# Patient Record
Sex: Female | Born: 1960 | Race: White | Hispanic: No | Marital: Married | Smoking: Current every day smoker
Health system: Southern US, Community
[De-identification: ages and names within clinical notes are randomized; demographics above are authoritative.]

## PROBLEM LIST (undated history)

## (undated) DIAGNOSIS — I639 Cerebral infarction, unspecified: Secondary | ICD-10-CM

## (undated) DIAGNOSIS — M549 Dorsalgia, unspecified: Secondary | ICD-10-CM

## (undated) DIAGNOSIS — J45909 Unspecified asthma, uncomplicated: Secondary | ICD-10-CM

## (undated) DIAGNOSIS — G5600 Carpal tunnel syndrome, unspecified upper limb: Secondary | ICD-10-CM

## (undated) DIAGNOSIS — N200 Calculus of kidney: Secondary | ICD-10-CM

## (undated) DIAGNOSIS — I10 Essential (primary) hypertension: Secondary | ICD-10-CM

## (undated) DIAGNOSIS — J449 Chronic obstructive pulmonary disease, unspecified: Secondary | ICD-10-CM

## (undated) HISTORY — DX: Essential (primary) hypertension: I10

## (undated) HISTORY — DX: Carpal tunnel syndrome, unspecified upper limb: G56.00

## (undated) HISTORY — PX: SHOULDER SURGERY: SHX246

## (undated) HISTORY — DX: Cerebral infarction, unspecified: I63.9

## (undated) HISTORY — DX: Chronic obstructive pulmonary disease, unspecified: J44.9

## (undated) HISTORY — DX: Calculus of kidney: N20.0

## (undated) HISTORY — DX: Unspecified asthma, uncomplicated: J45.909

## (undated) HISTORY — DX: Dorsalgia, unspecified: M54.9

## (undated) HISTORY — PX: OTHER SURGICAL HISTORY: SHX169

## (undated) HISTORY — PX: CHOLECYSTECTOMY: SHX55

---

## 1998-06-22 ENCOUNTER — Ambulatory Visit (HOSPITAL_COMMUNITY): Admission: RE | Admit: 1998-06-22 | Discharge: 1998-06-22 | Payer: Self-pay | Admitting: Gynecology

## 2001-10-06 ENCOUNTER — Other Ambulatory Visit: Admission: RE | Admit: 2001-10-06 | Discharge: 2001-10-06 | Payer: Self-pay | Admitting: Obstetrics and Gynecology

## 2002-09-21 ENCOUNTER — Encounter: Payer: Self-pay | Admitting: Obstetrics and Gynecology

## 2002-09-21 ENCOUNTER — Ambulatory Visit (HOSPITAL_COMMUNITY): Admission: RE | Admit: 2002-09-21 | Discharge: 2002-09-21 | Payer: Self-pay | Admitting: Obstetrics and Gynecology

## 2003-08-31 ENCOUNTER — Other Ambulatory Visit: Admission: RE | Admit: 2003-08-31 | Discharge: 2003-08-31 | Payer: Self-pay | Admitting: Obstetrics and Gynecology

## 2004-11-06 ENCOUNTER — Other Ambulatory Visit: Admission: RE | Admit: 2004-11-06 | Discharge: 2004-11-06 | Payer: Self-pay | Admitting: Obstetrics and Gynecology

## 2006-01-23 ENCOUNTER — Other Ambulatory Visit: Admission: RE | Admit: 2006-01-23 | Discharge: 2006-01-23 | Payer: Self-pay | Admitting: Obstetrics and Gynecology

## 2007-02-12 ENCOUNTER — Ambulatory Visit (HOSPITAL_COMMUNITY): Admission: RE | Admit: 2007-02-12 | Discharge: 2007-02-12 | Payer: Self-pay | Admitting: Obstetrics and Gynecology

## 2007-03-19 ENCOUNTER — Other Ambulatory Visit: Admission: RE | Admit: 2007-03-19 | Discharge: 2007-03-19 | Payer: Self-pay | Admitting: Obstetrics and Gynecology

## 2007-06-10 ENCOUNTER — Ambulatory Visit (HOSPITAL_BASED_OUTPATIENT_CLINIC_OR_DEPARTMENT_OTHER): Admission: RE | Admit: 2007-06-10 | Discharge: 2007-06-10 | Payer: Self-pay | Admitting: Obstetrics and Gynecology

## 2008-01-28 ENCOUNTER — Ambulatory Visit: Payer: Self-pay | Admitting: Orthopedic Surgery

## 2008-01-28 DIAGNOSIS — M5430 Sciatica, unspecified side: Secondary | ICD-10-CM | POA: Insufficient documentation

## 2008-02-24 ENCOUNTER — Telehealth: Payer: Self-pay | Admitting: Orthopedic Surgery

## 2008-03-10 ENCOUNTER — Ambulatory Visit: Payer: Self-pay | Admitting: Orthopedic Surgery

## 2008-03-10 DIAGNOSIS — M5126 Other intervertebral disc displacement, lumbar region: Secondary | ICD-10-CM | POA: Insufficient documentation

## 2008-03-11 ENCOUNTER — Telehealth: Payer: Self-pay | Admitting: Orthopedic Surgery

## 2008-03-16 ENCOUNTER — Ambulatory Visit (HOSPITAL_COMMUNITY): Admission: RE | Admit: 2008-03-16 | Discharge: 2008-03-16 | Payer: Self-pay | Admitting: Orthopedic Surgery

## 2008-03-31 ENCOUNTER — Ambulatory Visit: Payer: Self-pay | Admitting: Orthopedic Surgery

## 2008-06-04 ENCOUNTER — Encounter: Admission: RE | Admit: 2008-06-04 | Discharge: 2008-06-04 | Payer: Self-pay | Admitting: Neurology

## 2008-06-09 ENCOUNTER — Encounter: Admission: RE | Admit: 2008-06-09 | Discharge: 2008-06-09 | Payer: Self-pay | Admitting: Neurology

## 2008-07-02 ENCOUNTER — Other Ambulatory Visit: Admission: RE | Admit: 2008-07-02 | Discharge: 2008-07-02 | Payer: Self-pay | Admitting: Obstetrics and Gynecology

## 2008-07-05 ENCOUNTER — Encounter: Payer: Self-pay | Admitting: Orthopedic Surgery

## 2008-07-06 ENCOUNTER — Telehealth: Payer: Self-pay | Admitting: Orthopedic Surgery

## 2009-08-10 ENCOUNTER — Encounter: Admission: RE | Admit: 2009-08-10 | Discharge: 2009-08-10 | Payer: Self-pay | Admitting: Neurosurgery

## 2009-10-12 ENCOUNTER — Ambulatory Visit (HOSPITAL_COMMUNITY): Admission: RE | Admit: 2009-10-12 | Discharge: 2009-10-12 | Payer: Self-pay | Admitting: Obstetrics and Gynecology

## 2010-06-27 ENCOUNTER — Encounter: Admission: RE | Admit: 2010-06-27 | Discharge: 2010-06-27 | Payer: Self-pay | Admitting: Neurosurgery

## 2010-12-30 ENCOUNTER — Encounter: Payer: Self-pay | Admitting: Obstetrics and Gynecology

## 2011-01-11 NOTE — Assessment & Plan Note (Signed)
Summary: 6 wk RE-CK FOL'G MED/BCBS/CAF    History of Present Illness: I saw Judith Sullivan in the office today for a 6 week followup visit.  She is a 50 years old woman with the complaint of:  left leg pain.  Patient is here today to recheck leg and reflexes in the foot. She is also here to check response to Neurontin.  Patient states that she is still hurting, just not as numb. She is hurting in her left lower leg along the lateral border of the leg.    Current Allergies: No known allergies   Past Medical History:    Reviewed history from 01/28/2008 and no changes required:       type 2 diabetes       high blood pressure       anxiety  Past Surgical History:    Reviewed history from 01/28/2008 and no changes required:       c section       gallbladder       kidney stone/basket retrieval     Review of Systems      See HPI  MS      improved still sore   "I need to go back to work"    Physical Exam  Skin:     intact without lesions or rashes Psych:     alert and cooperative; normal mood and affect; normal attention span and concentration   Detailed Back/Spine Exam  Lumbosacral Exam:  Lying Straight Leg Raise:    Right:  negative    Left:  negative Sitting Straight Leg Raise:    Right:  negative    Left:  negative   Foot/Ankle Exam  General:    Well-developed, well-nourished ,normal body habitus; no deformities, normal grooming.    Gait:    Normal heel-toe gait pattern bilaterally.    Skin:    Intact with no scars, lesions, rashes, cafe-au-lait spots or bruising.    Inspection:    Inspection reveals no deformity, ecchymosis or swelling.   Palpation:    non-tender to palpation over distal leg, medial and lateral ankle, distal achilles tendon, medial and lateral hindfoot, posterior heel, plantar heel, midfoot and forefoot bilaterally.   Vascular:    dorsalis pedis and posterior tibial pulses 2+ and symmetric, capillary refill < 2 seconds, normal  hair pattern, no evidence of ischemia.   Sensory:    decreased L-L5.    Motor:    Motor strength 5/5 bilaterally for ankle dorsiflexion, ankle plantar flexion, ankle inversion and ankle eversion.    Reflexes:    Normal and symmetric Achilles reflexes bilaterally.      Impression & Recommendations:  Problem # 1:  SCIATICA (ICD-724.3) Assessment: Unchanged  Her updated medication list for this problem includes:    Lortab 5 5-500 Mg Tabs (Hydrocodone-acetaminophen) .Marland Kitchen... 1 q 4 as needed    Mobic 7.5 Mg Tabs (Meloxicam) .Marland Kitchen... 1 by mouth two times a day  Orders: Est. 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 Patient Level III (40347)   Medications Added to Medication List This Visit: 1)  Neurontin 100 Mg Caps (Gabapentin) .... 2 tabs every 8 hrs 2)  Lortab 5 5-500 Mg Tabs (Hydrocodone-acetaminophen) .Marland Kitchen.. 1 q 4 as needed 3)  Mobic 7.5 Mg Tabs (Meloxicam) .Marland Kitchen.. 1 by mouth two times a day   Patient  Instructions: 1)  MRI L SPINE F/U     Prescriptions: MOBIC 7.5 MG  TABS (MELOXICAM) 1 by mouth two times a day  #60 x 0   Entered and Authorized by:   Fuller Canada MD   Signed by:   Fuller Canada MD on 03/10/2008   Method used:   Print then Give to Patient   RxID:   [id number] LORTAB 5 5-500 MG  TABS (HYDROCODONE-ACETAMINOPHEN) 1 q 4 as needed  #60 x 0   Entered and Authorized by:   Fuller Canada MD   Signed by:   Fuller Canada MD on 03/10/2008   Method used:   Print then Give to Patient   RxID:   [id number] NEURONTIN 100 MG  CAPS (GABAPENTIN) 2 tabs every 8 hrs  #90 x 0   Entered and Authorized by:   Fuller Canada MD   Signed by:   Fuller Canada MD on 03/10/2008   Method used:   Print then Give to Patient   RxID:   [id number]  ]

## 2011-04-24 NOTE — Op Note (Signed)
NAMEHENNESSEY, Judith Sullivan               ACCOUNT NO.:  [id number]   MEDICAL RECORD NO.:  [id number]          PATIENT TYPE:  AMB   LOCATION:  [facility]                         FACILITY:  [facility]   PHYSICIAN:  Cynthia P. Romine, M.D.DATE OF BIRTH:  01/24/1961   DATE OF PROCEDURE:  06/10/2007  DATE OF DISCHARGE:                               OPERATIVE REPORT   PREOPERATIVE DIAGNOSIS:  Menorrhagia.   POSTOPERATIVE DIAGNOSIS:  Menorrhagia.   PROCEDURE:  Endometrial ablation using Hydrosorb ablation technique.   SURGEON:  Cynthia P. Romine, M.D.   ANESTHESIA:  General by LMA and paracervical block.   ESTIMATED BLOOD LOSS:  Minimal.   COMPLICATIONS:  None.   PROCEDURE:  The patient was taken to the operating room and after the  induction of adequate general anesthesia by LMA was placed in the dorsal  lithotomy position and prepped and draped in the usual fashion.  A  posterior weighted and anterior Sims retractor were placed.  The cervix  was grasped on its anterior lip with a single-tooth tenaculum.  Paracervical block was instituted by injecting 10 mL of 1% Xylocaine at  each of 3 and 9 o'clock.  The uterus then sounded to 9 cm.  The cervix  was dilated to a #25 Shawnie Pons.  The hysteroscope was introduced.  The  endometrial cavity appeared clean.  The tubal ostia were clearly  visualized.  Photographic documentation was taken.  The scope was  withdrawn to a position just inside the internal os and endometrial  ablation was then carried out in the usual fashion according to the  manufacturer's instructions.  Following completion of the ablation, the  cavity was again photographed.  It appeared completely ablated.  The  scope was withdrawn and the procedure was terminated.  The instruments  were removed from the vagina and the patient was taken to the recovery  room in satisfactory condition.      Cynthia P. Romine, M.D.  Electronically Signed     CPR/MEDQ  D:  06/10/2007  T:  06/10/2007   Job:  [id number]

## 2011-09-25 LAB — BASIC METABOLIC PANEL
BUN: 11
CO2: 27
Calcium: 9.4
Chloride: 107
Creatinine, Ser: 0.71
GFR calc Af Amer: >60
GFR calc non Af Amer: >60
Glucose, Bld: 133 — ABNORMAL HIGH
Potassium: 3.8
Sodium: 140

## 2011-09-25 LAB — CBC
HCT: 43.8
Hemoglobin: 15.4 — ABNORMAL HIGH
MCHC: 35.3
MCV: 93.5
Platelets: 236
RBC: 4.68
RDW: 11.7
WBC: 5.9

## 2013-10-27 ENCOUNTER — Encounter (INDEPENDENT_AMBULATORY_CARE_PROVIDER_SITE_OTHER): Payer: Self-pay | Admitting: *Deleted

## 2018-12-16 ENCOUNTER — Encounter: Payer: Self-pay | Admitting: Neurology

## 2019-02-01 NOTE — Progress Notes (Deleted)
[facility]rology [facility] Clinic Note - Initial Visit   Date: 02/01/19  Judith Sullivan MRN: [id number] DOB: [birth year]   Dear Dr. Sherril Croon:  Thank you for your kind referral of Judith Sullivan for consultation of left face and arm numbness. Although her history is well known to you, please allow [place] to reiterate it for the purpose of our medical record. The patient was accompanied to the clinic by *** who also provides collateral information.     History of Present Illness: Judith Sullivan is a 58 y.o. ***-handed Caucasian/*** female with peripheral arterial disease, diabetes mellitus, COPD, hyperlipidemia, hypertension, anxiety,*** presenting for evaluation of ***.   Starting in early July 2019, patient began having numbness involving the left side of her head, face, and arm.  She was evaluated by Dr. Newell Coral who had ordered MRI cervical spine and brain which were unrevealing, per notes.  I do not have images or reports to review.  She has had injections to the neck, without any relief.  Ultrasound carotids shows ***  Out-side paper records, electronic medical record, and images have been reviewed where available and summarized as: ***  No past medical history on file.  *** The histories are not reviewed yet. Please review them in the "History" navigator section and refresh this SmartLink.   Medications:  No outpatient encounter medications on file as of 02/02/2019.   No facility-administered encounter medications on file as of 02/02/2019.     Allergies: Allergies not on file  Family History: No family history on file.  Social History: Social History   Tobacco Use  . Smoking status: Not on file  Substance Use Topics  . Alcohol use: Not on file  . Drug use: Not on file   Social History   Social History Narrative  . Not on file    Review of Systems:  CONSTITUTIONAL: No fevers, chills, night sweats, or weight loss.  *** EYES: No visual changes or eye  pain ENT: No hearing changes.  No history of nose bleeds.   RESPIRATORY: No cough, wheezing and shortness of breath.   CARDIOVASCULAR: Negative for chest pain, and palpitations.   GI: Negative for abdominal discomfort, blood in stools or black stools.  No recent change in bowel habits.   GU:  No history of incontinence.   MUSCLOSKELETAL: No history of joint pain or swelling.  No myalgias.   SKIN: Negative for lesions, rash, and itching.   HEMATOLOGY/ONCOLOGY: Negative for prolonged bleeding, bruising easily, and swollen nodes.  No history of cancer.   ENDOCRINE: Negative for cold or heat intolerance, polydipsia or goiter.   PSYCH:  ***depression or anxiety symptoms.   NEURO: As Above.   Vital Signs:  There were no vitals taken for this visit.   General Medical Exam:  *** General:  Well appearing, comfortable.   Eyes/ENT: see cranial nerve examination.   Neck:   No carotid bruits. Respiratory:  Clear to auscultation, good air entry bilaterally.   Cardiac:  Regular rate and rhythm, no murmur.   Extremities:  No deformities, edema, or skin discoloration.  Skin:  No rashes or lesions.  Neurological Exam: MENTAL STATUS including orientation to time, place, person, recent and remote memory, attention span and concentration, language, and fund of knowledge is ***normal.  Speech is not dysarthric.  CRANIAL NERVES: II:  No visual field defects.  Unremarkable fundi.   III-IV-VI: Pupils equal round and reactive to light.  Normal conjugate, extra-ocular eye movements in all directions of  gaze.  No nystagmus.  No ptosis***.   V:  Normal facial sensation.    VII:  Normal facial symmetry and movements.   VIII:  Normal hearing and vestibular function.   IX-X:  Normal palatal movement.   XI:  Normal shoulder shrug and head rotation.   XII:  Normal tongue strength and range of motion, no deviation or fasciculation.  MOTOR:  No atrophy, fasciculations or abnormal movements.  No pronator drift.    Right Upper Extremity:    Left Upper Extremity:    Deltoid  5/5   Deltoid  5/5   Biceps  5/5   Biceps  5/5   Triceps  5/5   Triceps  5/5   Wrist extensors  5/5   Wrist extensors  5/5   Wrist flexors  5/5   Wrist flexors  5/5   Finger extensors  5/5   Finger extensors  5/5   Finger flexors  5/5   Finger flexors  5/5   Dorsal interossei  5/5   Dorsal interossei  5/5   Abductor pollicis  5/5   Abductor pollicis  5/5   Tone (Ashworth scale)  0  Tone (Ashworth scale)  0   Right Lower Extremity:    Left Lower Extremity:    Hip flexors  5/5   Hip flexors  5/5   Hip extensors  5/5   Hip extensors  5/5   Knee flexors  5/5   Knee flexors  5/5   Knee extensors  5/5   Knee extensors  5/5   Dorsiflexors  5/5   Dorsiflexors  5/5   Plantarflexors  5/5   Plantarflexors  5/5   Toe extensors  5/5   Toe extensors  5/5   Toe flexors  5/5   Toe flexors  5/5   Tone (Ashworth scale)  0  Tone (Ashworth scale)  0   MSRs:  Right                                                                 Left brachioradialis 2+  brachioradialis 2+  biceps 2+  biceps 2+  triceps 2+  triceps 2+  patellar 2+  patellar 2+  ankle jerk 2+  ankle jerk 2+  Hoffman no  Hoffman no  plantar response down  plantar response down   SENSORY:  Normal and symmetric perception of light touch, pinprick, vibration, and proprioception.  Romberg's sign absent.   COORDINATION/GAIT: Normal finger-to- nose-finger and heel-to-shin.  Intact rapid alternating movements bilaterally.  Able to rise from a chair without using arms.  Gait narrow based and stable. Tandem and stressed gait intact.    IMPRESSION: ***  PLAN/RECOMMENDATIONS:  *** Request MRI cervical spine and brain images and reports *** Return to clinic in *** months.    Thank you for allowing me to participate in patient's care.  If I can answer any additional questions, I would be pleased to do so.    Sincerely,    Donika K. Allena Katz, DO

## 2019-02-02 ENCOUNTER — Ambulatory Visit: Payer: Self-pay | Admitting: Neurology

## 2019-04-06 ENCOUNTER — Encounter: Payer: Self-pay | Admitting: Neurology

## 2019-04-13 ENCOUNTER — Encounter: Payer: Self-pay | Admitting: *Deleted

## 2019-04-13 NOTE — Progress Notes (Signed)
New Patient Virtual Visit via Video Note The purpose of this virtual visit is to provide medical care while limiting exposure to the novel coronavirus.    Consent was obtained for video visit:  Yes.   Answered questions that patient had about telehealth interaction:  Yes.   I discussed the limitations, risks, security and privacy concerns of performing an evaluation and management service by telemedicine. I also discussed with the patient that there may be a patient responsible charge related to this service. The patient expressed understanding and agreed to proceed.  Pt location: Home Physician Location: office Name of referring provider:  Ignatius SpeckingVyas, Dhruv B, MD I connected with Judith Sullivan at patients initiation/request on 04/15/2019 at 10:30 AM EDT by video enabled telemedicine application and verified that I am speaking with the correct person using two identifiers. Pt MRN:  [id number] Pt DOB:  06/07/1961 Video Participants:  Judith Sullivan    History of Present Illness: Judith Sullivan is a 58 y.o. left-handed Caucasian female with peripheral arterial disease, tobacco use, COPD, chronic pain, diabetes, hypertension, and history of cervical fusion presenting for evaluation of left face and arm numbness and tingling.   Starting in July 2019, she began having numbness over the left side of the face and left arm.  Symptoms are constant. Nothing alleviates or exacerbates her symptoms. She feels weaker on the left side, but is not falling and still able to perform tasks with her left hand.  She also has tongue numbness over the left side.  She does not have double vision, difficulty with swallowing or talking.  She has episodic dizziness, described as both room-spinning and unsteadiness.  She was evaluated by Dr. Newell CoralNudelman and had MRI of the cervical spine and brain, which did not show any explanation for her symptoms.  She was told that she may have a vascular problem and referred to my office for  further evaluation.  Past Medical History:  Diagnosis Date   Asthma with bronchitis    Backache    COPD (chronic obstructive pulmonary disease) (HCC)    CTS (carpal tunnel syndrome)    Hypertension    Kidney stones     Past Surgical History:  Procedure Laterality Date   CESAREAN SECTION     CHOLECYSTECTOMY     kidney stone removal     neck fusion     SHOULDER SURGERY       Medications:  Outpatient Encounter Medications as of 04/15/2019  Medication Sig   amitriptyline (ELAVIL) 25 MG tablet Take 25 mg by mouth at bedtime.   gabapentin (NEURONTIN) 800 MG tablet Take 800 mg by mouth 3 (three) times daily.   lisinopril (ZESTRIL) 20 MG tablet    metFORMIN (GLUCOPHAGE) 500 MG tablet Take by mouth 2 (two) times daily with a meal. Take 2 po qam and 1 po qhs.   metoprolol tartrate (LOPRESSOR) 50 MG tablet    oxyCODONE (ROXICODONE) 15 MG immediate release tablet Take 15 mg by mouth 4 (four) times daily as needed.   sitaGLIPtin (JANUVIA) 100 MG tablet Take 100 mg by mouth daily.   tiZANidine (ZANAFLEX) 4 MG tablet Take 4 mg by mouth every 8 (eight) hours as needed.   No facility-administered encounter medications on file as of 04/15/2019.     Allergies: No Known Allergies  Family History: Family History  Problem Relation Age of Onset   Stroke Mother    Hypertension Mother    Heart attack Father  Social History: Social History   Tobacco Use   Smoking status: Current Every Day Smoker    Packs/day: 1.00    Years: 35.00    Pack years: 35.00   Smokeless tobacco: Never Used  Substance Use Topics   Alcohol use: Yes    Comment: very rarely   Drug use: Not Currently   Social History   Social History Narrative   Lives with husband in a one story home.  Has 2 children.  Works as a Interior and spatial designer.  Education: 13 years.      Review of Systems:  CONSTITUTIONAL: No fevers, chills, night sweats, or weight loss.   EYES: No visual changes or eye pain ENT:  No hearing changes.  No history of nose bleeds.   RESPIRATORY: No cough, wheezing and shortness of breath.   CARDIOVASCULAR: Negative for chest pain, and palpitations.   GI: Negative for abdominal discomfort, blood in stools or black stools.  No recent change in bowel habits.   GU:  No history of incontinence.   MUSCLOSKELETAL: No history of joint pain or swelling.  No myalgias.   SKIN: Negative for lesions, rash, and itching.   HEMATOLOGY/ONCOLOGY: Negative for prolonged bleeding, bruising easily, and swollen nodes.  No history of cancer.   ENDOCRINE: Negative for cold or heat intolerance, polydipsia or goiter.   PSYCH:  No depression or anxiety symptoms.   NEURO: As Above.   General Medical Exam:  Well appearing, comfortable.  Nonlabored breathing.  No deformity or edema.  No rash.  Neurological Exam: MENTAL STATUS including orientation to time, place, person, recent and remote memory, attention span and concentration, language, and fund of knowledge is normal.  Speech is not dysarthric.  CRANIAL NERVES:  Normal conjugate, extra-ocular eye movements in all directions of gaze.  No ptosis.  Normal facial symmetry and movements.  Normal shoulder shrug and head rotation.  Tongue is midline.  MOTOR:  Antigravity in all extremities.  No abnormal movements.  No pronator drift.   COORDINATION/GAIT: Normal finger to nose bilaterally.  Intact rapid alternating movements bilaterally.  Able to rise from a chair without using arms.  Gait narrow based and stable.   IMPRESSION/PLAN: 1.  Left face and arm numbness, constant without fluctuating course.  She has had prior work-up with MRI brain and cervical spine, however I do not have these records to review.  She tells me this was done at [facility] and at Dr. Earl Gala office.  I will request these records, but since we are unable to have a signed release form, I have also asked the patient to reach out to her providers to see if they can fax it  to me.  With her history of peripheral arterial disease and tobacco use, she is certainly at risk for intracranial stenosis.  However, if this was the case I would expect her to have findings on MRI brain suggestive of a sensory stroke.  I have recommended that she start on low-dose aspirin 81 mg daily. I will obtain MRA head and ultrasound carotids to evaluate her vessel status.  2.  Tobacco abuse with PAD She is currently smoking 1 pack/day.  Patient was informed of the dangers of tobacco abuse including stroke, cancer, and MI, as well as the benefits of cessation.  She does express interest in trying to quit and has not had any success with Chantix or nicotine patch. Approximately 7 mins were spent counseling patient cessation techniques. We discussed various methods to help quit smoking, including  deciding on a date to quit, joining a support group, pharmacological agents- nicotine gum/patch/lozenges, chantix.  I will reassess her progress at the next follow-up visit   Follow Up Instructions:  I discussed the assessment and treatment plan with the patient. The patient was provided an opportunity to ask questions and all were answered. The patient agreed with the plan and demonstrated an understanding of the instructions.   The patient was advised to call back or seek an in-person evaluation if the symptoms worsen or if the condition fails to improve as anticipated.  Return to clinic after testing   Glendale Chard, DO

## 2019-04-15 ENCOUNTER — Telehealth (INDEPENDENT_AMBULATORY_CARE_PROVIDER_SITE_OTHER): Payer: 59 | Admitting: Neurology

## 2019-04-15 ENCOUNTER — Other Ambulatory Visit: Payer: Self-pay

## 2019-04-15 ENCOUNTER — Encounter: Payer: Self-pay | Admitting: Neurology

## 2019-04-15 DIAGNOSIS — R200 Anesthesia of skin: Secondary | ICD-10-CM

## 2019-04-15 DIAGNOSIS — R202 Paresthesia of skin: Secondary | ICD-10-CM

## 2019-04-15 DIAGNOSIS — R42 Dizziness and giddiness: Secondary | ICD-10-CM

## 2019-04-15 DIAGNOSIS — Z720 Tobacco use: Secondary | ICD-10-CM | POA: Diagnosis not present

## 2019-04-15 NOTE — Addendum Note (Signed)
Addended by: Dorthy Cooler on: 04/15/2019 12:52 PM   Modules accepted: Orders

## 2019-04-17 ENCOUNTER — Ambulatory Visit: Payer: 59 | Admitting: Neurology

## 2019-05-07 ENCOUNTER — Ambulatory Visit
Admission: RE | Admit: 2019-05-07 | Discharge: 2019-05-07 | Disposition: A | Discharge status: Home / Self Care | Payer: 59 | Source: Ambulatory Visit | Attending: Neurology | Admitting: Neurology

## 2019-05-07 ENCOUNTER — Other Ambulatory Visit: Payer: Self-pay

## 2019-05-07 DIAGNOSIS — R42 Dizziness and giddiness: Secondary | ICD-10-CM

## 2019-05-07 DIAGNOSIS — R202 Paresthesia of skin: Secondary | ICD-10-CM

## 2019-05-07 DIAGNOSIS — R200 Anesthesia of skin: Secondary | ICD-10-CM

## 2019-05-14 ENCOUNTER — Telehealth: Payer: Self-pay

## 2019-05-14 ENCOUNTER — Telehealth: Payer: Self-pay | Admitting: Neurology

## 2019-05-14 NOTE — Telephone Encounter (Signed)
-----   Message from Glendale Chard, DO sent at 05/14/2019  8:27 AM EDT ----- See note below for results.  Not sure if this task was completed from last week.  Thanks.

## 2019-05-14 NOTE — Telephone Encounter (Signed)
Pt wants to know the results of the test she had done last week please call

## 2019-05-14 NOTE — Telephone Encounter (Signed)
Attempted to reach patient regarding MRI results.  No answer or message system.  Will try again.

## 2019-05-15 NOTE — Telephone Encounter (Signed)
Spoke with patient.  Informed her of MRI results and recommendations.  E-visit made.

## 2019-05-15 NOTE — Telephone Encounter (Signed)
Attempted to return patient call regarding results.  Patient did not answer and I was unable to leave a message.

## 2019-05-28 ENCOUNTER — Encounter: Payer: Self-pay | Admitting: Neurology

## 2019-05-29 ENCOUNTER — Other Ambulatory Visit: Payer: Self-pay

## 2019-05-29 ENCOUNTER — Telehealth (INDEPENDENT_AMBULATORY_CARE_PROVIDER_SITE_OTHER): Payer: 59 | Admitting: Neurology

## 2019-05-29 VITALS — Ht 61.0 in | Wt 116.0 lb

## 2019-05-29 DIAGNOSIS — R200 Anesthesia of skin: Secondary | ICD-10-CM

## 2019-05-29 DIAGNOSIS — R202 Paresthesia of skin: Secondary | ICD-10-CM | POA: Diagnosis not present

## 2019-05-29 NOTE — Progress Notes (Signed)
   Virtual Visit via Video Note The purpose of this virtual visit is to provide medical care while limiting exposure to the novel coronavirus.    Consent was obtained for video visit:  Yes.   Answered questions that patient had about telehealth interaction:  Yes.   I discussed the limitations, risks, security and privacy concerns of performing an evaluation and management service by telemedicine. I also discussed with the patient that there may be a patient responsible charge related to this service. The patient expressed understanding and agreed to proceed.  Pt location: Home Physician Location: office Name of referring provider:  Glenda Chroman, MD I connected with Judith Sullivan at patients initiation/request on 05/29/2019 at  9:50 AM EDT by video enabled telemedicine application and verified that I am speaking with the correct person using two identifiers. Pt MRN:  [id number] Pt DOB:  December 11, 1960 Video Participants:  Judith Sullivan   History of Present Illness: This is a 58 y.o. female left-handed female with peripheral arterial disease, tobacco use, COPD, chronic pain, diabetes, hypertension, and history of cervical fusion returning for follow-up of left face and arm numbness.  She continues to have numbness over the left side of the face and arm, which is constant.  Her neurological testing has included MRI brain, MRA head, US carotids, and MRI cervical spine which does not have any structural lesion to explain her symptoms.  overall, there has been no worsening of symptoms, but no significant improvement either.  She does not have any double vision, difficulty swallowing, or facial weakness.  She has left arm weakness due to shoulder pain, and is seeing Dr. Alma Friendly.   Observations/Objective:   Vitals:   05/28/19 1116  Weight: 116 lb (52.6 kg)  Height: 5\' 1"  (1.549 m)   Patient is awake, alert, and appears comfortable.  Oriented x 4.   Extraocular muscles are intact. No ptosis.  Face is  symmetric.  Speech is not dysarthric. Tongue is midline. Antigravity in all extremities.  Limited left arm extension due to shoulder pain.  No pronator drift. Gait appears normal.   Assessment and Plan:  Left face and arm numbness, constant since July 2019.  Prior work-up has been extensive and included MRI brain, MRI cervical spine, MRA head, and ultrasound carotids which did not show structural pathology to explain symptoms.  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  Her MRI brain and cervical spine was performed at Dr. Donnella Bi office and [facility].  I have asked her to request the images and report so I can personally reviewed these.  Patient was reassured that worrisome pathology such as stroke, aneurysm, multiple sclerosis, or brain tumor have been excluded.  If symptoms progress or do not improve, CSF testing would be the next step.  She does not wish to pursue this at this time.   Follow Up Instructions:   I discussed the assessment and treatment plan with the patient. The patient was provided an opportunity to ask questions and all were answered. The patient agreed with the plan and demonstrated an understanding of the instructions.   The patient was advised to call back or seek an in-person evaluation if the symptoms worsen or if the condition fails to improve as anticipated.  Follow-up in 3 months  Total time spent:  20 minutes     Alda Berthold, DO

## 2019-09-02 ENCOUNTER — Ambulatory Visit: Payer: 59 | Admitting: Neurology

## 2019-09-02 ENCOUNTER — Encounter: Payer: Self-pay | Admitting: Neurology

## 2019-09-04 ENCOUNTER — Ambulatory Visit (INDEPENDENT_AMBULATORY_CARE_PROVIDER_SITE_OTHER): Payer: 59 | Admitting: Neurology

## 2019-09-04 DIAGNOSIS — Z5329 Procedure and treatment not carried out because of patient's decision for other reasons: Secondary | ICD-10-CM

## 2019-09-04 NOTE — Progress Notes (Signed)
No show

## 2019-10-14 ENCOUNTER — Other Ambulatory Visit: Payer: Self-pay | Admitting: Nurse Practitioner

## 2019-10-14 DIAGNOSIS — M542 Cervicalgia: Secondary | ICD-10-CM

## 2019-10-31 ENCOUNTER — Other Ambulatory Visit: Payer: 59

## 2020-10-04 ENCOUNTER — Inpatient Hospital Stay (HOSPITAL_COMMUNITY)
Admission: EM | Admit: 2020-10-04 | Discharge: 2020-10-12 | DRG: 040 | Disposition: A | Discharge status: Home Health | Payer: 59 | Attending: Internal Medicine | Admitting: Internal Medicine

## 2020-10-04 ENCOUNTER — Emergency Department (HOSPITAL_COMMUNITY): Payer: 59

## 2020-10-04 ENCOUNTER — Other Ambulatory Visit: Payer: Self-pay

## 2020-10-04 ENCOUNTER — Encounter (HOSPITAL_COMMUNITY): Payer: Self-pay

## 2020-10-04 ENCOUNTER — Observation Stay (HOSPITAL_COMMUNITY): Payer: 59

## 2020-10-04 DIAGNOSIS — J449 Chronic obstructive pulmonary disease, unspecified: Secondary | ICD-10-CM | POA: Diagnosis present

## 2020-10-04 DIAGNOSIS — E538 Deficiency of other specified B group vitamins: Secondary | ICD-10-CM | POA: Diagnosis present

## 2020-10-04 DIAGNOSIS — J9601 Acute respiratory failure with hypoxia: Secondary | ICD-10-CM | POA: Diagnosis present

## 2020-10-04 DIAGNOSIS — Z7189 Other specified counseling: Secondary | ICD-10-CM

## 2020-10-04 DIAGNOSIS — Z978 Presence of other specified devices: Secondary | ICD-10-CM

## 2020-10-04 DIAGNOSIS — R569 Unspecified convulsions: Secondary | ICD-10-CM | POA: Diagnosis present

## 2020-10-04 DIAGNOSIS — I6340 Cerebral infarction due to embolism of unspecified cerebral artery: Principal | ICD-10-CM | POA: Diagnosis present

## 2020-10-04 DIAGNOSIS — R29705 NIHSS score 5: Secondary | ICD-10-CM | POA: Diagnosis not present

## 2020-10-04 DIAGNOSIS — G9341 Metabolic encephalopathy: Secondary | ICD-10-CM | POA: Diagnosis present

## 2020-10-04 DIAGNOSIS — Z8249 Family history of ischemic heart disease and other diseases of the circulatory system: Secondary | ICD-10-CM

## 2020-10-04 DIAGNOSIS — E870 Hyperosmolality and hypernatremia: Secondary | ICD-10-CM | POA: Diagnosis present

## 2020-10-04 DIAGNOSIS — I10 Essential (primary) hypertension: Secondary | ICD-10-CM | POA: Diagnosis present

## 2020-10-04 DIAGNOSIS — R29707 NIHSS score 7: Secondary | ICD-10-CM | POA: Diagnosis not present

## 2020-10-04 DIAGNOSIS — R29712 NIHSS score 12: Secondary | ICD-10-CM | POA: Diagnosis not present

## 2020-10-04 DIAGNOSIS — R64 Cachexia: Secondary | ICD-10-CM | POA: Diagnosis present

## 2020-10-04 DIAGNOSIS — M21331 Wrist drop, right wrist: Secondary | ICD-10-CM | POA: Diagnosis present

## 2020-10-04 DIAGNOSIS — G8929 Other chronic pain: Secondary | ICD-10-CM | POA: Diagnosis present

## 2020-10-04 DIAGNOSIS — E785 Hyperlipidemia, unspecified: Secondary | ICD-10-CM | POA: Diagnosis present

## 2020-10-04 DIAGNOSIS — Z515 Encounter for palliative care: Secondary | ICD-10-CM

## 2020-10-04 DIAGNOSIS — E440 Moderate protein-calorie malnutrition: Secondary | ICD-10-CM | POA: Diagnosis present

## 2020-10-04 DIAGNOSIS — Z20822 Contact with and (suspected) exposure to covid-19: Secondary | ICD-10-CM | POA: Diagnosis present

## 2020-10-04 DIAGNOSIS — R29725 NIHSS score 25: Secondary | ICD-10-CM | POA: Diagnosis not present

## 2020-10-04 DIAGNOSIS — F17210 Nicotine dependence, cigarettes, uncomplicated: Secondary | ICD-10-CM | POA: Diagnosis present

## 2020-10-04 DIAGNOSIS — Z87442 Personal history of urinary calculi: Secondary | ICD-10-CM

## 2020-10-04 DIAGNOSIS — Z823 Family history of stroke: Secondary | ICD-10-CM

## 2020-10-04 DIAGNOSIS — E871 Hypo-osmolality and hyponatremia: Secondary | ICD-10-CM | POA: Diagnosis present

## 2020-10-04 DIAGNOSIS — T4275XA Adverse effect of unspecified antiepileptic and sedative-hypnotic drugs, initial encounter: Secondary | ICD-10-CM | POA: Diagnosis present

## 2020-10-04 DIAGNOSIS — Z681 Body mass index (BMI) 19 or less, adult: Secondary | ICD-10-CM

## 2020-10-04 DIAGNOSIS — R81 Glycosuria: Secondary | ICD-10-CM | POA: Diagnosis present

## 2020-10-04 DIAGNOSIS — T380X5A Adverse effect of glucocorticoids and synthetic analogues, initial encounter: Secondary | ICD-10-CM | POA: Diagnosis not present

## 2020-10-04 DIAGNOSIS — M1990 Unspecified osteoarthritis, unspecified site: Secondary | ICD-10-CM | POA: Diagnosis present

## 2020-10-04 DIAGNOSIS — R4182 Altered mental status, unspecified: Secondary | ICD-10-CM | POA: Diagnosis not present

## 2020-10-04 DIAGNOSIS — Z7984 Long term (current) use of oral hypoglycemic drugs: Secondary | ICD-10-CM

## 2020-10-04 DIAGNOSIS — E1165 Type 2 diabetes mellitus with hyperglycemia: Secondary | ICD-10-CM | POA: Diagnosis not present

## 2020-10-04 DIAGNOSIS — G5600 Carpal tunnel syndrome, unspecified upper limb: Secondary | ICD-10-CM | POA: Diagnosis present

## 2020-10-04 DIAGNOSIS — E873 Alkalosis: Secondary | ICD-10-CM | POA: Diagnosis present

## 2020-10-04 DIAGNOSIS — E559 Vitamin D deficiency, unspecified: Secondary | ICD-10-CM | POA: Diagnosis present

## 2020-10-04 DIAGNOSIS — G928 Other toxic encephalopathy: Secondary | ICD-10-CM | POA: Diagnosis present

## 2020-10-04 DIAGNOSIS — R946 Abnormal results of thyroid function studies: Secondary | ICD-10-CM | POA: Diagnosis present

## 2020-10-04 DIAGNOSIS — J15211 Pneumonia due to Methicillin susceptible Staphylococcus aureus: Secondary | ICD-10-CM | POA: Diagnosis present

## 2020-10-04 DIAGNOSIS — Z79899 Other long term (current) drug therapy: Secondary | ICD-10-CM

## 2020-10-04 DIAGNOSIS — J690 Pneumonitis due to inhalation of food and vomit: Secondary | ICD-10-CM | POA: Diagnosis present

## 2020-10-04 DIAGNOSIS — I639 Cerebral infarction, unspecified: Secondary | ICD-10-CM

## 2020-10-04 DIAGNOSIS — T426X1A Poisoning by other antiepileptic and sedative-hypnotic drugs, accidental (unintentional), initial encounter: Secondary | ICD-10-CM | POA: Diagnosis present

## 2020-10-04 DIAGNOSIS — Z981 Arthrodesis status: Secondary | ICD-10-CM

## 2020-10-04 DIAGNOSIS — G9340 Encephalopathy, unspecified: Secondary | ICD-10-CM

## 2020-10-04 DIAGNOSIS — R7401 Elevation of levels of liver transaminase levels: Secondary | ICD-10-CM | POA: Diagnosis present

## 2020-10-04 DIAGNOSIS — G936 Cerebral edema: Secondary | ICD-10-CM | POA: Diagnosis present

## 2020-10-04 LAB — URINALYSIS, COMPLETE (UACMP) WITH MICROSCOPIC
Bilirubin Urine: NEGATIVE
Glucose, UA: >=500 mg/dL — AB
Ketones, ur: 80 mg/dL — AB
Leukocytes,Ua: NEGATIVE
Nitrite: NEGATIVE
Protein, ur: 100 mg/dL — AB
Specific Gravity, Urine: 1.021 (ref 1.005–1.030)
pH: 6 (ref 5.0–8.0)

## 2020-10-04 LAB — COMPREHENSIVE METABOLIC PANEL
ALT: 92 U/L — ABNORMAL HIGH (ref 0–44)
AST: 46 U/L — ABNORMAL HIGH (ref 15–41)
Albumin: 4.4 g/dL (ref 3.5–5.0)
Alkaline Phosphatase: 56 U/L (ref 38–126)
Anion gap: 17 — ABNORMAL HIGH (ref 5–15)
BUN: 37 mg/dL — ABNORMAL HIGH (ref 6–20)
CO2: 23 mmol/L (ref 22–32)
Calcium: 10 mg/dL (ref 8.9–10.3)
Chloride: 100 mmol/L (ref 98–111)
Creatinine, Ser: 0.93 mg/dL (ref 0.44–1.00)
GFR, Estimated: >60 mL/min (ref >60)
Glucose, Bld: 201 mg/dL — ABNORMAL HIGH (ref 70–99)
Potassium: 3.7 mmol/L (ref 3.5–5.1)
Sodium: 140 mmol/L (ref 135–145)
Total Bilirubin: 1.3 mg/dL — ABNORMAL HIGH (ref 0.3–1.2)
Total Protein: 7.7 g/dL (ref 6.5–8.1)

## 2020-10-04 LAB — RAPID URINE DRUG SCREEN, HOSP PERFORMED
Amphetamines: NOT DETECTED
Barbiturates: NOT DETECTED
Benzodiazepines: NOT DETECTED
Cocaine: NOT DETECTED
Opiates: NOT DETECTED
Tetrahydrocannabinol: NOT DETECTED

## 2020-10-04 LAB — CBC WITH DIFFERENTIAL/PLATELET
Abs Immature Granulocytes: 0.02 10*3/uL (ref 0.00–0.07)
Basophils Absolute: 0 10*3/uL (ref 0.0–0.1)
Basophils Relative: 0 %
Eosinophils Absolute: 0 10*3/uL (ref 0.0–0.5)
Eosinophils Relative: 0 %
HCT: 48.7 % — ABNORMAL HIGH (ref 36.0–46.0)
Hemoglobin: 16.9 g/dL — ABNORMAL HIGH (ref 12.0–15.0)
Immature Granulocytes: 0 %
Lymphocytes Relative: 5 %
Lymphs Abs: 0.4 10*3/uL — ABNORMAL LOW (ref 0.7–4.0)
MCH: 32.2 pg (ref 26.0–34.0)
MCHC: 34.7 g/dL (ref 30.0–36.0)
MCV: 92.8 fL (ref 80.0–100.0)
Monocytes Absolute: 0.4 10*3/uL (ref 0.1–1.0)
Monocytes Relative: 5 %
Neutro Abs: 7.5 10*3/uL (ref 1.7–7.7)
Neutrophils Relative %: 90 %
Platelets: 336 10*3/uL (ref 150–400)
RBC: 5.25 MIL/uL — ABNORMAL HIGH (ref 3.87–5.11)
RDW: 12.4 % (ref 11.5–15.5)
WBC: 8.3 10*3/uL (ref 4.0–10.5)
nRBC: 0 % (ref 0.0–0.2)

## 2020-10-04 LAB — CK: Total CK: 961 U/L — ABNORMAL HIGH (ref 38–234)

## 2020-10-04 LAB — SALICYLATE LEVEL: Salicylate Lvl: <7 mg/dL — ABNORMAL LOW (ref 7.0–30.0)

## 2020-10-04 LAB — I-STAT CHEM 8, ED
BUN: 35 mg/dL — ABNORMAL HIGH (ref 6–20)
Calcium, Ion: 1.17 mmol/L (ref 1.15–1.40)
Chloride: 104 mmol/L (ref 98–111)
Creatinine, Ser: 0.9 mg/dL (ref 0.44–1.00)
Glucose, Bld: 204 mg/dL — ABNORMAL HIGH (ref 70–99)
HCT: 48 % — ABNORMAL HIGH (ref 36.0–46.0)
Hemoglobin: 16.3 g/dL — ABNORMAL HIGH (ref 12.0–15.0)
Potassium: 3.6 mmol/L (ref 3.5–5.1)
Sodium: 142 mmol/L (ref 135–145)
TCO2: 23 mmol/L (ref 22–32)

## 2020-10-04 LAB — RESPIRATORY PANEL BY RT PCR (FLU A&B, COVID)
Influenza A by PCR: NEGATIVE
Influenza B by PCR: NEGATIVE
SARS Coronavirus 2 by RT PCR: NEGATIVE

## 2020-10-04 LAB — LACTIC ACID, PLASMA: Lactic Acid, Venous: 1.4 mmol/L (ref 0.5–1.9)

## 2020-10-04 LAB — BLOOD GAS, VENOUS
Acid-Base Excess: 0.1 mmol/L (ref 0.0–2.0)
Bicarbonate: 25.3 mmol/L (ref 20.0–28.0)
FIO2: 21
O2 Saturation: 92.4 %
Patient temperature: 37
pCO2, Ven: 31.6 mmHg — ABNORMAL LOW (ref 44.0–60.0)
pH, Ven: 7.48 — ABNORMAL HIGH (ref 7.250–7.430)
pO2, Ven: 66 mmHg — ABNORMAL HIGH (ref 32.0–45.0)

## 2020-10-04 LAB — ACETAMINOPHEN LEVEL: Acetaminophen (Tylenol), Serum: <10 ug/mL — ABNORMAL LOW (ref 10–30)

## 2020-10-04 LAB — AMMONIA: Ammonia: 9 umol/L (ref 9–35)

## 2020-10-04 LAB — GLUCOSE, CAPILLARY: Glucose-Capillary: 196 mg/dL — ABNORMAL HIGH (ref 70–99)

## 2020-10-04 LAB — TSH: TSH: 0.068 u[IU]/mL — ABNORMAL LOW (ref 0.350–4.500)

## 2020-10-04 LAB — ETHANOL: Alcohol, Ethyl (B): <10 mg/dL (ref <10)

## 2020-10-04 MED ORDER — INSULIN ASPART 100 UNIT/ML ~~LOC~~ SOLN
0.0000 [IU] | Freq: Three times a day (TID) | SUBCUTANEOUS | Status: DC
  Administered 2020-10-05 – 2020-10-07 (×6): 3 [IU] via SUBCUTANEOUS

## 2020-10-04 MED ORDER — INSULIN ASPART 100 UNIT/ML ~~LOC~~ SOLN: 0.0000 [IU] | Freq: Every day | SUBCUTANEOUS | Status: DC

## 2020-10-04 MED ORDER — HEPARIN SODIUM (PORCINE) 5000 UNIT/ML IJ SOLN
5000.0000 [IU] | Freq: Three times a day (TID) | INTRAMUSCULAR | Status: DC
  Administered 2020-10-04 – 2020-10-05 (×4): 5000 [IU] via SUBCUTANEOUS
  Filled 2020-10-04 (×4): qty 1

## 2020-10-04 MED ORDER — POLYETHYLENE GLYCOL 3350 17 G PO PACK: 17.0000 g | PACK | Freq: Every day | ORAL | Status: DC | PRN

## 2020-10-04 MED ORDER — LACTATED RINGERS IV BOLUS
1000.0000 mL | Freq: Once | INTRAVENOUS | Status: AC
  Administered 2020-10-04: 1000 mL via INTRAVENOUS

## 2020-10-04 MED ORDER — ONDANSETRON HCL 4 MG/2ML IJ SOLN: 4.0000 mg | Freq: Four times a day (QID) | INTRAMUSCULAR | Status: DC | PRN

## 2020-10-04 MED ORDER — ENSURE ENLIVE PO LIQD
237.0000 mL | Freq: Two times a day (BID) | ORAL | Status: DC
  Administered 2020-10-05: 237 mL via ORAL

## 2020-10-04 MED ORDER — LISINOPRIL 10 MG PO TABS
20.0000 mg | ORAL_TABLET | Freq: Every day | ORAL | Status: DC
  Administered 2020-10-04: 20 mg via ORAL
  Filled 2020-10-04 (×2): qty 2

## 2020-10-04 MED ORDER — METOPROLOL TARTRATE 50 MG PO TABS
50.0000 mg | ORAL_TABLET | Freq: Two times a day (BID) | ORAL | Status: DC
  Administered 2020-10-04: 50 mg via ORAL
  Filled 2020-10-04 (×2): qty 1

## 2020-10-04 MED ORDER — METOPROLOL TARTRATE 5 MG/5ML IV SOLN
5.0000 mg | Freq: Four times a day (QID) | INTRAVENOUS | Status: DC | PRN
  Administered 2020-10-04 – 2020-10-05 (×2): 5 mg via INTRAVENOUS
  Filled 2020-10-04 (×2): qty 5

## 2020-10-04 MED ORDER — ONDANSETRON HCL 4 MG PO TABS: 4.0000 mg | ORAL_TABLET | Freq: Four times a day (QID) | ORAL | Status: DC | PRN

## 2020-10-04 NOTE — ED Provider Notes (Signed)
  Provider Note MRN:  [id number]  Arrival date & time: 10/04/20    ED Course and Medical Decision Making  Assumed care from Dr. Renaye Rakers at shift change.  Suspect polypharmacy, patient seems to be mentally clearing during her few hours here in the emergency department.  Work-up thus far reassuring.  On my exam she wakes to voice, she is able to state her name but still does not know where she is, still quite altered, will continue to monitor.  Hoping for further clearance and discharge.  6:15 PM update: After several more hours of observation, patient continues to not mentally clear, she is drooling, she nods her head yes to all questions, continues to seem confused.  She does not occasionally state her name.  Last known to be at baseline 3+ days ago.  Will admit to medicine for further care.  Procedures  Final Clinical Impressions(s) / ED Diagnoses     ICD-10-CM   1. Altered mental status, unspecified altered mental status type  R41.82     ED Discharge Orders    None      Discharge Instructions   None     Elmer Sow. Pilar Plate, MD [facility] [email or web address]    Sabas Sous, MD 10/04/20 [phone]

## 2020-10-04 NOTE — ED Provider Notes (Signed)
[facility] EMERGENCY DEPARTMENT Provider Note   CSN: [id number] Arrival date & time: 10/04/20  1028     History Chief Complaint  Patient presents with  . Altered Mental Status    Judith Sullivan is a 59 y.o. female presenting to ED with AMS.  EMS called to house by her husband, her spouse Katlin Bortner, who says the patient was somnolent and "vomiting" this morning.  I spoke to Mr Rottmann who tells me that the patient was "nodding out" on Sunday, three days ago, when he left to go on a camping trip with his son.  He suspected she may have taken "too many of her pills."  When he returned home yesterday she was still "tired, sleeping all day."  Then this morning she vomited and was difficult to arouse.    He says, "I know she takes gabapentin, muscle relaxers, and pain meds, but I'm pretty sure she's out of pain meds.  I found an empty bottle of gabapentin on the ground."  He says in the past she has "taken too many of her pills and mixes them up" and behaved this way, but never quite this drowsy and somnolent, which had him concerned.   Per chart review she takes gabapentin 800 mg TID and zanaflex 4 mg TID. She is also prescribed roxicodone 15 mg 4 times daily prescribed monthly, most recently on 10/7 per PDMP review.        HPI     Past Medical History:  Diagnosis Date  . Asthma with bronchitis   . Backache   . COPD (chronic obstructive pulmonary disease) (HCC)   . CTS (carpal tunnel syndrome)   . Hypertension   . Kidney stones     Patient Active Problem List   Diagnosis Date Noted  . 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 CESAREAN SECTION    . CHOLECYSTECTOMY    . kidney stone removal    . neck fusion    . SHOULDER SURGERY       OB History   No obstetric history on file.     Family History  Problem Relation Age of Onset  . Stroke Mother   . Hypertension Mother   . Heart attack Father      Social History   Tobacco Use  . Smoking status: Current Every Day Smoker    Packs/day: 1.00    Years: 35.00    Pack years: 35.00  . Smokeless tobacco: Never Used  Vaping Use  . Vaping Use: Never used  Substance Use Topics  . Alcohol use: Yes    Comment: very rarely  . Drug use: Not Currently    Home Medications Prior to Admission medications   Medication Sig Start Date End Date Taking? Authorizing Provider  amitriptyline (ELAVIL) 25 MG tablet Take 25 mg by mouth at bedtime. 01/29/19   [provider]  gabapentin (NEURONTIN) 800 MG tablet Take 800 mg by mouth 3 (three) times daily. 01/09/19   [provider]  lisinopril (ZESTRIL) 20 MG tablet  11/24/18   [provider]  metFORMIN (GLUCOPHAGE) 500 MG tablet Take by mouth 2 (two) times daily with a meal. Take 2 po qam and 1 po qhs.    [provider]  metoprolol tartrate (LOPRESSOR) 50 MG tablet  11/24/18   [provider]  oxyCODONE (ROXICODONE) 15 MG immediate release tablet Take 15 mg by mouth 4 (four)  times daily as needed. 01/09/19   [provider]  sitaGLIPtin (JANUVIA) 100 MG tablet Take 100 mg by mouth daily.    [provider]  tiZANidine (ZANAFLEX) 4 MG tablet Take 4 mg by mouth every 8 (eight) hours as needed. 01/09/19   [provider]    Allergies    Patient has no known allergies.  Review of Systems   Review of Systems  Reason unable to perform ROS: Patient nonverbal and noncompliant with questioning.    Physical Exam Updated Vital Signs BP (!) 186/77   Pulse 67   Resp (!) 24   Ht 5' (1.524 m)   Wt 45.4 kg   SpO2 93%   BMI 19.53 kg/m   Physical Exam Vitals and nursing note reviewed.  Constitutional:      Appearance: She is well-developed.     Comments: Thin, chronically ill appearing  HENT:     Head: Normocephalic and atraumatic.  Eyes:     Conjunctiva/sclera: Conjunctivae normal.     Pupils: Pupils are equal, round, and  reactive to light.  Cardiovascular:     Rate and Rhythm: Normal rate and regular rhythm.     Pulses: Normal pulses.  Pulmonary:     Effort: Pulmonary effort is normal. No respiratory distress.  Abdominal:     Palpations: Abdomen is soft.     Tenderness: There is no abdominal tenderness.  Musculoskeletal:     Cervical back: Neck supple.  Skin:    General: Skin is warm and dry.  Neurological:     Mental Status: She is alert.     Comments: Not following commands Slurred speech, behaving erratically Moving all extremities spontaneously No obvious facial droop     ED Results / Procedures / Treatments   Labs (all labs ordered are listed, but only abnormal results are displayed) Labs Reviewed - No data to display  EKG None  Radiology No results found.  Procedures Procedures (including critical care time)  Medications Ordered in ED Medications - No data to display  ED Course  I have reviewed the triage vital signs and the nursing notes.  Pertinent labs & imaging results that were available during my care of the patient were reviewed by me and considered in my medical decision making (see chart for details).  This patient presents to the Emergency Department with complaint of altered mental status.  This involves an extensive number of treatment options, and is a complaint that carries with it a high risk of complications and morbidity.  The differential diagnosis includes polypharmacy vs  hypoglycemia vs metabolic encephalopathy vs infection (including cystitis) vs ICH vs stroke  vs other  Based on the history obtained from her husband, along with her clinical exam and workup in the ED, I am most suspicious of polypharmacy with zanaflex and gabapentin at home, which can cause excessive somnolence and vomiting consistent with her reported symptoms.   I ordered, reviewed, and interpreted labs, including CMP, UA, Covid swab, Ammonia, Etoh level, VBG, CBC.  These results did not  show an acute cause for encephalitis or AMS.   UDS negative for opioids. UA without clear evidence of infection I ordered imaging studies which included CTH I independently visualized and interpreted imaging which showed no acute infarct or ICH, and the monitor tracing which showed NSR Additional history was obtained from the patient's spouse by phone Previous records obtained and reviewed showing patient's prescription medications I personally reviewed the patients ECG which showed sinus rhythm with  no acute ischemic findings   Clinical Course as of Oct 04 1649  Tue Oct 04, 2020  1109 Unable to reach Elias Else with phone number provided - voicemail not set up   [MT]  1158  IMPRESSION: No acute abnormality.  Atherosclerosis.   [MT]  1203 No acute findings on CTH.  No immediate cause for encephalopathy on labs here.  No fever, leukocytosis, or photophobia to suggest bacterial meningitis or sepsis.   [MT]  1204 Awaiting UA   [MT]  1259 I spoke to her husband Avanni Turnbaugh - and updated her history.  I now suspect this is likely due to polypharmacy.  I'm concerned about her oxycodone, gabapentin, and zanaflex prescriptions at home.  She'll need monitoring in the ED until improved sobriety.     [MT]  1400 On reassessment, patient more awake now, directable in conversation.  She feels "tired."  She denies overdose on her medications but cannot tell me how many she's been taking or what the dosages are   [MT]  1523 Signed out to EDP Dr Pilar Plate with plan to continue monitoring, consider discharge home if improved sobriety   [MT]    Clinical Course User Index [MT] Tessica Cupo, Kermit Balo, MD    Final Clinical Impression(s) / ED Diagnoses Final diagnoses:  None    Rx / DC Orders ED Discharge Orders    None       Terald Sleeper, MD 10/04/20 1654

## 2020-10-04 NOTE — H&P (Signed)
TRH H&P    Patient Demographics:    Judith Sullivan, is a 59 y.o. female  MRN: [id number]  DOB - 03-Mar-1961  Admit Date - 10/04/2020  Referring MD/NP/PA: Pilar Plate  Outpatient Primary MD for the patient is Ignatius Specking, MD  Patient coming from: Home  Chief complaint-altered mental status   HPI:    Judith Sullivan  is a 59 y.o. female, with history of diabetes mellitus type 2, hypertension, COPD, presents to the ER via EMS.  Patient will open eyes to voice, but is unable to provide any history at this time as she is currently nonverbal.  I personally called to speak with husband reports that this all started with her pain medication.  He reports that she had some neck surgery and has some chronic neck pain, and if she runs out of her pain medication she tries to sedate herself.  He reports that she may have taken all of her gabapentin over the weekend.  He was going on a camping trip with her grandson, and he does not know for sure.  He reports that when he returned he found her somnolent.  She had an empty bottle of gabapentin on the ground.  I attempted to call the pharmacy to find out how much gabapentin she had been in that bottle but pharmacy is closed.  If day team would like to follow-up pharmacy is [facility] [place].  Husband reports patient remains somnolent all day Sunday.  On Monday she had vomited on herself, and when he started trying to clean her up she was combative.  He said it was like she did not know she had thrown up on herself.  He put her in the shower got her cleaned up, put her back to bed, and she remained somnolent all through the night Sunday night, all through Monday, and then she was throwing up again today.  He decided that 3 days of somnolence, being nonverbal, not eating or drinking, not taking her medications, all in the setting of diabetes with too much and he decided to call EMS.   Husband reports that when patient threw up it was frothy, with phlegm, and undigested food.  He reports he tried to give her Coca-Cola, he thinks she was able to get it down, but then immediately came back up.  Of note husband does report that he found her bottle of Zanaflex and there was about 10 pills in the which was correct for when she last got it filled.  In the ED patient initially seemed to be slowly clearing her mental status.  The day time ED provider reported that she was able to say her name to him.  He thought that she would start to wake up and then be able to go home.  Second shift ED provider noted that after several more hours, patient still was drooling/bubbling at the mouth, nonverbal, barely following commands, and just not safe to send home.  Admission was requested for further work-up and management of acute metabolic encephalopathy.  Temperature  98.5, heart rate 101, respiratory rate 31, blood pressure 182/82, satting at 94% on room air VBG shows a pH of 7.48, PO2 of 66, PCO2 of 31.6 CBC shows white blood cell count of 8.3, hemoglobin 16.9 CHEM panel is unremarkable with a glucose of 201 Slightly elevated gap at 17, but bicarb is 23, pH is 7.48 Slightly elevated transaminases with an AST of 46 T bili slightly elevated at 1.3 Lactic acid is within normal limits at 1.4 EKG shows a heart rate of 82, sinus rhythm, QTC of 461 UA shows glucosuria greater than 500, and 80 ketones -not indicative of UTI    Review of systems:    Review of Systems  Unable to perform ROS: Mental status change       Past History of the following :    Past Medical History:  Diagnosis Date  . Asthma with bronchitis   . Backache   . COPD (chronic obstructive pulmonary disease) (HCC)   . CTS (carpal tunnel syndrome)   . Hypertension   . Kidney stones       Past Surgical History:  Procedure Laterality Date  . CESAREAN SECTION    . CHOLECYSTECTOMY    . kidney stone removal    . neck  fusion    . SHOULDER SURGERY        Social History:      Social History   Tobacco Use  . Smoking status: Current Every Day Smoker    Packs/day: 1.00    Years: 35.00    Pack years: 35.00  . Smokeless tobacco: Never Used  Substance Use Topics  . Alcohol use: Yes    Comment: very rarely       Family History :     Family History  Problem Relation Age of Onset  . Stroke Mother   . Hypertension Mother   . Heart attack Father    Reviewed   Home Medications:   Prior to Admission medications   Medication Sig Start Date End Date Taking? Authorizing Provider  amitriptyline (ELAVIL) 25 MG tablet Take 25 mg by mouth at bedtime. 01/29/19  Yes [provider]  gabapentin (NEURONTIN) 800 MG tablet Take 800 mg by mouth 3 (three) times daily. 01/09/19  Yes [provider]  oxyCODONE (ROXICODONE) 15 MG immediate release tablet Take 15 mg by mouth 4 (four) times daily as needed. 01/09/19  Yes [provider]  valACYclovir (VALTREX) 1000 MG tablet Take 1,000 mg by mouth daily. 09/05/20  Yes [provider]  levofloxacin (LEVAQUIN) 500 MG tablet Take 500 mg by mouth daily. Patient not taking: Reported on 10/04/2020 09/07/20   [provider]  lisinopril (ZESTRIL) 20 MG tablet  11/24/18   [provider]  metFORMIN (GLUCOPHAGE) 500 MG tablet Take by mouth 2 (two) times daily with a meal. Take 2 po qam and 1 po qhs.    [provider]  metoprolol tartrate (LOPRESSOR) 50 MG tablet  11/24/18   [provider]  predniSONE (DELTASONE) 5 MG tablet Take by mouth. Patient not taking: Reported on 10/04/2020 06/15/20   [provider]  sitaGLIPtin (JANUVIA) 100 MG tablet Take 100 mg by mouth daily.    [provider]  tiZANidine (ZANAFLEX) 4 MG tablet Take 4 mg by mouth every 8 (eight) hours as needed. 01/09/19   [provider]     Allergies:    No Known Allergies   Physical Exam:   Vitals  Blood  pressure (!) 182/82, pulse  97, temperature 98.5 F (36.9 C), temperature source Oral, resp. rate (!) 31, height 5' (1.524 m), weight 45.4 kg, SpO2 94 %.  1.  General: Lying supine in bed in no acute distress  2. Psychiatric: Only nodding head yes, cannot assess  3. Neurologic: GCS of 10, opens eyes to voice, follows commands, no verbal response Very weakly moves all 4 extremities Does not participate in cranial nerve exam  4. HEENMT:  Head is normocephalic, healing bruise in the center forehead, neck is supple, trachea is midline, mucous membranes are mildly dry  5. Respiratory : Tachypnea, lungs are clear to auscultation bilaterally, no wheeze, no rhonchi  6. Cardiovascular : Heart rate slightly tachycardic at the time of exam, no murmurs rubs or gallops  7. Gastrointestinal:  No grimace to palpation of abdomen, nondistended, no masses palpated  8. Skin:  No acute lesion on limited skin exam  9.Musculoskeletal:  No peripheral edema, peripheral pulses palpated, no acute deformity, no calf tenderness    Data Review:    CBC Recent Labs  Lab 10/04/20 1113 10/04/20 1120  WBC  --  8.3  HGB 16.3* 16.9*  HCT 48.0* 48.7*  PLT  --  336  MCV  --  92.8  MCH  --  32.2  MCHC  --  34.7  RDW  --  12.4  LYMPHSABS  --  0.4*  MONOABS  --  0.4  EOSABS  --  0.0  BASOSABS  --  0.0   ------------------------------------------------------------------------------------------------------------------  Results for orders placed or performed during the hospital encounter of 10/04/20 (from the past 48 hour(s))  Urinalysis, Complete w Microscopic     Status: Abnormal   Collection Time: 10/04/20 11:05 AM  Result Value Ref Range   Color, Urine YELLOW YELLOW   APPearance HAZY (A) CLEAR   Specific Gravity, Urine 1.021 1.005 - 1.030   pH 6.0 5.0 - 8.0   Glucose, UA >=500 (A) NEGATIVE mg/dL   Hgb urine dipstick LARGE (A) NEGATIVE   Bilirubin Urine NEGATIVE NEGATIVE   Ketones, ur 80 (A)  NEGATIVE mg/dL   Protein, ur 161 (A) NEGATIVE mg/dL   Nitrite NEGATIVE NEGATIVE   Leukocytes,Ua NEGATIVE NEGATIVE   RBC / HPF 0-5 0 - 5 RBC/hpf   WBC, UA 6-10 0 - 5 WBC/hpf   Bacteria, UA RARE (A) NONE SEEN   Squamous Epithelial / LPF 0-5 0 - 5   Mucus PRESENT     Comment: Performed at [facility], [address] [place], [place] [address]  Urine rapid drug screen (hosp performed)     Status: None   Collection Time: 10/04/20 11:06 AM  Result Value Ref Range   Opiates NONE DETECTED NONE DETECTED   Cocaine NONE DETECTED NONE DETECTED   Benzodiazepines NONE DETECTED NONE DETECTED   Amphetamines NONE DETECTED NONE DETECTED   Tetrahydrocannabinol NONE DETECTED NONE DETECTED   Barbiturates NONE DETECTED NONE DETECTED    Comment: (NOTE) DRUG SCREEN FOR MEDICAL PURPOSES ONLY.  IF CONFIRMATION IS NEEDED FOR ANY PURPOSE, NOTIFY LAB WITHIN 5 DAYS.  LOWEST DETECTABLE LIMITS FOR URINE DRUG SCREEN Drug Class                     Cutoff (ng/mL) Amphetamine and metabolites    1000 Barbiturate and metabolites    200 Benzodiazepine                 200 Tricyclics and metabolites     300 Opiates and metabolites  300 Cocaine and metabolites        300 THC                            50 Performed at [facility], [address], [place], [place] [address]   I-Stat Chem 8, ED     Status: Abnormal   Collection Time: 10/04/20 11:13 AM  Result Value Ref Range   Sodium 142 135 - 145 mmol/L   Potassium 3.6 3.5 - 5.1 mmol/L   Chloride 104 98 - 111 mmol/L   BUN 35 (H) 6 - 20 mg/dL   Creatinine, Ser 5.28 0.44 - 1.00 mg/dL   Glucose, Bld 413 (H) 70 - 99 mg/dL    Comment: Glucose reference range applies only to samples taken after fasting for at least 8 hours.   Calcium, Ion 1.17 1.15 - 1.40 mmol/L   TCO2 23 22 - 32 mmol/L   Hemoglobin 16.3 (H) 12.0 - 15.0 g/dL   HCT 24.4 (H) 36 - 46 %  Comprehensive metabolic panel     Status: Abnormal   Collection Time: 10/04/20 11:20 AM  Result Value Ref  Range   Sodium 140 135 - 145 mmol/L   Potassium 3.7 3.5 - 5.1 mmol/L   Chloride 100 98 - 111 mmol/L   CO2 23 22 - 32 mmol/L   Glucose, Bld 201 (H) 70 - 99 mg/dL    Comment: Glucose reference range applies only to samples taken after fasting for at least 8 hours.   BUN 37 (H) 6 - 20 mg/dL   Creatinine, Ser 0.10 0.44 - 1.00 mg/dL   Calcium 27.2 8.9 - 53.6 mg/dL   Total Protein 7.7 6.5 - 8.1 g/dL   Albumin 4.4 3.5 - 5.0 g/dL   AST 46 (H) 15 - 41 U/L   ALT 92 (H) 0 - 44 U/L   Alkaline Phosphatase 56 38 - 126 U/L   Total Bilirubin 1.3 (H) 0.3 - 1.2 mg/dL   GFR, Estimated >64 >40 mL/min    Comment: (NOTE) Calculated using the CKD-EPI Creatinine Equation (2021)    Anion gap 17 (H) 5 - 15    Comment: Performed at [facility], [address] [place], [place] [address]  CBC WITH DIFFERENTIAL     Status: Abnormal   Collection Time: 10/04/20 11:20 AM  Result Value Ref Range   WBC 8.3 4.0 - 10.5 K/uL   RBC 5.25 (H) 3.87 - 5.11 MIL/uL   Hemoglobin 16.9 (H) 12.0 - 15.0 g/dL   HCT 59.5 (H) 36 - 46 %   MCV 92.8 80.0 - 100.0 fL   MCH 32.2 26.0 - 34.0 pg   MCHC 34.7 30.0 - 36.0 g/dL   RDW 63.8 75.6 - 43.3 %   Platelets 336 150 - 400 K/uL   nRBC 0.0 0.0 - 0.2 %   Neutrophils Relative % 90 %   Neutro Abs 7.5 1.7 - 7.7 K/uL   Lymphocytes Relative 5 %   Lymphs Abs 0.4 (L) 0.7 - 4.0 K/uL   Monocytes Relative 5 %   Monocytes Absolute 0.4 0.1 - 1.0 K/uL   Eosinophils Relative 0 %   Eosinophils Absolute 0.0 0.0 - 0.5 K/uL   Basophils Relative 0 %   Basophils Absolute 0.0 0.0 - 0.1 K/uL   Immature Granulocytes 0 %   Abs Immature Granulocytes 0.02 0.00 - 0.07 K/uL    Comment: Performed at [facility], [address],  [place], [place] [address]  Ammonia     Status: None   Collection Time: 10/04/20 11:20 AM  Result Value Ref Range   Ammonia 9 9 - 35 umol/L    Comment: Performed at [facility], [address], [place], [place] [address]  Lactic acid, plasma     Status: None   Collection Time:  10/04/20 11:20 AM  Result Value Ref Range   Lactic Acid, Venous 1.4 0.5 - 1.9 mmol/L    Comment: Performed at [facility], [address], [place], [place] [address]  Ethanol     Status: None   Collection Time: 10/04/20 11:20 AM  Result Value Ref Range   Alcohol, Ethyl (B) <10 <10 mg/dL    Comment: (NOTE) Lowest detectable limit for serum alcohol is 10 mg/dL.  For medical purposes only. Performed at [facility], [address] [place], [place] [address]   Blood gas, venous     Status: Abnormal   Collection Time: 10/04/20 11:20 AM  Result Value Ref Range   FIO2 21.00    pH, Ven 7.480 (H) 7.25 - 7.43   pCO2, Ven 31.6 (L) 44 - 60 mmHg   pO2, Ven 66.0 (H) 32 - 45 mmHg   Bicarbonate 25.3 20.0 - 28.0 mmol/L   Acid-Base Excess 0.1 0.0 - 2.0 mmol/L   O2 Saturation 92.4 %   Patient temperature 37.0     Comment: Performed at [facility], [address] [place], [place] [address]  Respiratory Panel by RT PCR (Flu A&B, Covid) - Nasopharyngeal Swab     Status: None   Collection Time: 10/04/20 11:46 AM   Specimen: Nasopharyngeal Swab  Result Value Ref Range   SARS Coronavirus 2 by RT PCR NEGATIVE NEGATIVE    Comment: (NOTE) SARS-CoV-2 target nucleic acids are NOT DETECTED.  The SARS-CoV-2 RNA is generally detectable in upper respiratoy specimens during the acute phase of infection. The lowest concentration of SARS-CoV-2 viral copies this assay can detect is 131 copies/mL. A negative result does not preclude SARS-Cov-2 infection and should not be used as the sole basis for treatment or other patient management decisions. A negative result may occur with  improper specimen collection/handling, submission of specimen other than nasopharyngeal swab, presence of viral mutation(s) within the areas targeted by this assay, and inadequate number of viral copies (<131 copies/mL). A negative result must be combined with clinical observations, patient history, and epidemiological information.  The expected result is Negative.  Fact Sheet for Patients:  https://www.Aina.com/  Fact Sheet for Healthcare Providers:  https://www.young.biz/  This test is no t yet approved or cleared by the [place] FDA and  has been authorized for detection and/or diagnosis of SARS-CoV-2 by FDA under an Emergency Use Authorization (EUA). This EUA will remain  in effect (meaning this test can be used) for the duration of the COVID-19 declaration under Section 564(b)(1) of the Act, 21 U.S.C. section 360bbb-3(b)(1), unless the authorization is terminated or revoked sooner.     Influenza A by PCR NEGATIVE NEGATIVE   Influenza B by PCR NEGATIVE NEGATIVE    Comment: (NOTE) The Xpert Xpress SARS-CoV-2/FLU/RSV assay is intended as an aid in  the diagnosis of influenza from Nasopharyngeal swab specimens and  should not be used as a sole basis for treatment. Nasal washings and  aspirates are unacceptable for Xpert Xpress SARS-CoV-2/FLU/RSV  testing.  Fact Sheet for Patients: https://www.[employer].com/  Fact Sheet for Healthcare Providers: https://www.young.biz/  This test is not yet approved or cleared by the  [place] [employer] and  has been authorized for detection and/or diagnosis of SARS-CoV-2 by  FDA under an TEFL teacher (EUA). This EUA will remain  in effect (meaning this test can be used) for the duration of the  Covid-19 declaration under Section 564(b)(1) of the Act, 21  U.S.C. section 360bbb-3(b)(1), unless the authorization is  terminated or revoked. Performed at [facility], [address], [place], [place] [address]     Chemistries  Recent Labs  Lab 10/04/20 1113 10/04/20 1120  NA 142 140  K 3.6 3.7  CL 104 100  CO2  --  23  GLUCOSE 204* 201*  BUN 35* 37*  CREATININE 0.90 0.93  CALCIUM  --  10.0  AST  --  46*  ALT  --  92*  ALKPHOS  --  56  BILITOT  --  1.3*    ------------------------------------------------------------------------------------------------------------------  ------------------------------------------------------------------------------------------------------------------ GFR: Estimated Creatinine Clearance: 47.3 mL/min (by C-G formula based on SCr of 0.93 mg/dL). Liver Function Tests: Recent Labs  Lab 10/04/20 1120  AST 46*  ALT 92*  ALKPHOS 56  BILITOT 1.3*  PROT 7.7  ALBUMIN 4.4   No results for input(s): LIPASE, AMYLASE in the last 168 hours. Recent Labs  Lab 10/04/20 1120  AMMONIA 9   Coagulation Profile: No results for input(s): INR, PROTIME in the last 168 hours. Cardiac Enzymes: No results for input(s): CKTOTAL, CKMB, CKMBINDEX, TROPONINI in the last 168 hours. BNP (last 3 results) No results for input(s): PROBNP in the last 8760 hours. HbA1C: No results for input(s): HGBA1C in the last 72 hours. CBG: No results for input(s): GLUCAP in the last 168 hours. Lipid Profile: No results for input(s): CHOL, HDL, LDLCALC, TRIG, CHOLHDL, LDLDIRECT in the last 72 hours. Thyroid Function Tests: No results for input(s): TSH, T4TOTAL, FREET4, T3FREE, THYROIDAB in the last 72 hours. Anemia Panel: No results for input(s): VITAMINB12, FOLATE, FERRITIN, TIBC, IRON, RETICCTPCT in the last 72 hours.  --------------------------------------------------------------------------------------------------------------- Urine analysis:    Component Value Date/Time   COLORURINE YELLOW 10/04/2020 1105   APPEARANCEUR HAZY (A) 10/04/2020 1105   LABSPEC 1.021 10/04/2020 1105   PHURINE 6.0 10/04/2020 1105   GLUCOSEU >=500 (A) 10/04/2020 1105   HGBUR LARGE (A) 10/04/2020 1105   BILIRUBINUR NEGATIVE 10/04/2020 1105   KETONESUR 80 (A) 10/04/2020 1105   PROTEINUR 100 (A) 10/04/2020 1105   NITRITE NEGATIVE 10/04/2020 1105   LEUKOCYTESUR NEGATIVE 10/04/2020 1105      Imaging Results:    CT HEAD WO CONTRAST  Result Date:  10/04/2020 CLINICAL DATA:  Altered mental status. EXAM: CT HEAD WITHOUT CONTRAST TECHNIQUE: Contiguous axial images were obtained from the base of the skull through the vertex without intravenous contrast. COMPARISON:  Brain MRI 10/23/2018. FINDINGS: Brain: No evidence of acute infarction, hemorrhage, hydrocephalus, extra-axial collection or mass lesion/mass effect. Vascular: Atherosclerosis.  No hyperdense vessel. Skull: Intact.  No focal lesion. Sinuses/Orbits: Negative. Other: None. IMPRESSION: No acute abnormality. Atherosclerosis. Electronically Signed   By: Drusilla Kanner M.D.   On: 10/04/2020 11:51    My personal review of EKG: Rhythm NSR, Rate 82 /min, QTc 461 ,no Acute ST changes   Assessment & Plan:    Active Problems:   Metabolic encephalopathy   1. Metabolic encephalopathy 1. GCS of 10 2. Possibly gabapentin overdose 3. Poison control called and reports the fluids should be given, CPK should be checked along with Tylenol and salicylate levels which are already pending no further action recommended 4. TSH pending, as well as  UDS 5. Alcohol level less than 10 6. Glucose is elevated at 201, gap is slightly elevated at 17, but bicarb is 23 and pH is slightly alkalotic at 7.48 7. Hold gabapentin, Zanaflex, Elavil 8. Neurochecks every 4 hours 9. CT head showed no acute intracranial process 10. MRI in the a.m. 11. UA is not indicative of UTI, chest x-ray pending 12. Continue to monitor 2. Respiratory alkalosis 1. pH 7.48, PCO2 31.6 2. Tachypnea at 29 3. Possibly 2/2 medication overdose? 4. Maintaining normal oxygen saturations on room air 5. Continue to trend 3. HTN 1. No BP meds in several days 2. BP currently 182/82 3. Continue home medication when patient is alert enough to take p.o. medication 4. In the meantime metoprolol 5 mg IV every 6 hours as needed for systolic blood pressure greater than 175, diastolic blood pressure greater then 100, or heart rate greater than  120 4. Diabetes mellitus type 2 1. Hold Metformin and Januvia 2. Continue sliding scale coverage   DVT Prophylaxis-   heparin - SCDs   AM Labs Ordered, also please review Full Orders  Family Communication: Admission, patients condition and plan of care including tests being ordered have been discussed with husband who indicates understanding and agree with the plan and Code Status.  Code Status:  FULL  Admission status: Observation  Time spent in minutes : 64   Wallace Gappa B Zierle-Ghosh DO

## 2020-10-04 NOTE — ED Notes (Signed)
Pt with bruise noted to forehead. Pt shakes head no when asked if she fell.

## 2020-10-04 NOTE — ED Triage Notes (Signed)
Pt brought to ED for altered mental status. Pt family had left and went camping, came home and found her vomiting, says she sometimes has the tendency to overmedicate. Family states she is unsure how much of her meds she has took. Pt will respond to voice and follow commands but drowsy. Pt family told EMS unsure if she has eaten in 2-4 days. Pt denies pain by shaking head no.

## 2020-10-04 NOTE — ED Notes (Signed)
Pt husband called and states pt has been taking muscle relaxers and Neurontin together. Pt started vomiting this morning and emesis noted to be dark.

## 2020-10-05 ENCOUNTER — Inpatient Hospital Stay (HOSPITAL_COMMUNITY): Payer: 59

## 2020-10-05 ENCOUNTER — Observation Stay (HOSPITAL_COMMUNITY): Payer: 59

## 2020-10-05 DIAGNOSIS — G9340 Encephalopathy, unspecified: Secondary | ICD-10-CM | POA: Diagnosis not present

## 2020-10-05 DIAGNOSIS — R64 Cachexia: Secondary | ICD-10-CM | POA: Diagnosis present

## 2020-10-05 DIAGNOSIS — R4182 Altered mental status, unspecified: Secondary | ICD-10-CM | POA: Diagnosis present

## 2020-10-05 DIAGNOSIS — J1520 Pneumonia due to staphylococcus, unspecified: Secondary | ICD-10-CM | POA: Diagnosis not present

## 2020-10-05 DIAGNOSIS — J9601 Acute respiratory failure with hypoxia: Secondary | ICD-10-CM | POA: Diagnosis not present

## 2020-10-05 DIAGNOSIS — I10 Essential (primary) hypertension: Secondary | ICD-10-CM | POA: Diagnosis present

## 2020-10-05 DIAGNOSIS — E873 Alkalosis: Secondary | ICD-10-CM | POA: Diagnosis present

## 2020-10-05 DIAGNOSIS — R29712 NIHSS score 12: Secondary | ICD-10-CM | POA: Diagnosis not present

## 2020-10-05 DIAGNOSIS — G9341 Metabolic encephalopathy: Secondary | ICD-10-CM | POA: Diagnosis not present

## 2020-10-05 DIAGNOSIS — R569 Unspecified convulsions: Secondary | ICD-10-CM | POA: Diagnosis present

## 2020-10-05 DIAGNOSIS — J15211 Pneumonia due to Methicillin susceptible Staphylococcus aureus: Secondary | ICD-10-CM | POA: Diagnosis present

## 2020-10-05 DIAGNOSIS — E1169 Type 2 diabetes mellitus with other specified complication: Secondary | ICD-10-CM | POA: Diagnosis not present

## 2020-10-05 DIAGNOSIS — E782 Mixed hyperlipidemia: Secondary | ICD-10-CM | POA: Diagnosis not present

## 2020-10-05 DIAGNOSIS — J690 Pneumonitis due to inhalation of food and vomit: Secondary | ICD-10-CM | POA: Diagnosis present

## 2020-10-05 DIAGNOSIS — I6340 Cerebral infarction due to embolism of unspecified cerebral artery: Secondary | ICD-10-CM | POA: Diagnosis present

## 2020-10-05 DIAGNOSIS — E871 Hypo-osmolality and hyponatremia: Secondary | ICD-10-CM | POA: Diagnosis present

## 2020-10-05 DIAGNOSIS — E440 Moderate protein-calorie malnutrition: Secondary | ICD-10-CM | POA: Diagnosis present

## 2020-10-05 DIAGNOSIS — R29707 NIHSS score 7: Secondary | ICD-10-CM | POA: Diagnosis not present

## 2020-10-05 DIAGNOSIS — G928 Other toxic encephalopathy: Secondary | ICD-10-CM | POA: Diagnosis present

## 2020-10-05 DIAGNOSIS — G8929 Other chronic pain: Secondary | ICD-10-CM | POA: Diagnosis present

## 2020-10-05 DIAGNOSIS — J309 Allergic rhinitis, unspecified: Secondary | ICD-10-CM | POA: Diagnosis not present

## 2020-10-05 DIAGNOSIS — I639 Cerebral infarction, unspecified: Secondary | ICD-10-CM | POA: Diagnosis not present

## 2020-10-05 DIAGNOSIS — Z20822 Contact with and (suspected) exposure to covid-19: Secondary | ICD-10-CM | POA: Diagnosis present

## 2020-10-05 DIAGNOSIS — T380X5A Adverse effect of glucocorticoids and synthetic analogues, initial encounter: Secondary | ICD-10-CM | POA: Diagnosis not present

## 2020-10-05 DIAGNOSIS — T4275XA Adverse effect of unspecified antiepileptic and sedative-hypnotic drugs, initial encounter: Secondary | ICD-10-CM | POA: Diagnosis present

## 2020-10-05 DIAGNOSIS — T426X1A Poisoning by other antiepileptic and sedative-hypnotic drugs, accidental (unintentional), initial encounter: Secondary | ICD-10-CM | POA: Diagnosis present

## 2020-10-05 DIAGNOSIS — I6389 Other cerebral infarction: Secondary | ICD-10-CM | POA: Diagnosis not present

## 2020-10-05 DIAGNOSIS — Z681 Body mass index (BMI) 19 or less, adult: Secondary | ICD-10-CM | POA: Diagnosis not present

## 2020-10-05 DIAGNOSIS — R29725 NIHSS score 25: Secondary | ICD-10-CM | POA: Diagnosis not present

## 2020-10-05 DIAGNOSIS — E785 Hyperlipidemia, unspecified: Secondary | ICD-10-CM | POA: Diagnosis not present

## 2020-10-05 DIAGNOSIS — R29705 NIHSS score 5: Secondary | ICD-10-CM | POA: Diagnosis not present

## 2020-10-05 DIAGNOSIS — D72829 Elevated white blood cell count, unspecified: Secondary | ICD-10-CM | POA: Diagnosis not present

## 2020-10-05 DIAGNOSIS — G936 Cerebral edema: Secondary | ICD-10-CM | POA: Diagnosis present

## 2020-10-05 DIAGNOSIS — E870 Hyperosmolality and hypernatremia: Secondary | ICD-10-CM | POA: Diagnosis present

## 2020-10-05 DIAGNOSIS — E1165 Type 2 diabetes mellitus with hyperglycemia: Secondary | ICD-10-CM | POA: Diagnosis not present

## 2020-10-05 LAB — CBC WITH DIFFERENTIAL/PLATELET
Abs Immature Granulocytes: 0.02 10*3/uL (ref 0.00–0.07)
Basophils Absolute: 0 10*3/uL (ref 0.0–0.1)
Basophils Relative: 0 %
Eosinophils Absolute: 0 10*3/uL (ref 0.0–0.5)
Eosinophils Relative: 0 %
HCT: 47.1 % — ABNORMAL HIGH (ref 36.0–46.0)
Hemoglobin: 16.1 g/dL — ABNORMAL HIGH (ref 12.0–15.0)
Immature Granulocytes: 0 %
Lymphocytes Relative: 11 %
Lymphs Abs: 0.9 10*3/uL (ref 0.7–4.0)
MCH: 32.3 pg (ref 26.0–34.0)
MCHC: 34.2 g/dL (ref 30.0–36.0)
MCV: 94.4 fL (ref 80.0–100.0)
Monocytes Absolute: 0.7 10*3/uL (ref 0.1–1.0)
Monocytes Relative: 8 %
Neutro Abs: 6.9 10*3/uL (ref 1.7–7.7)
Neutrophils Relative %: 81 %
Platelets: 327 10*3/uL (ref 150–400)
RBC: 4.99 MIL/uL (ref 3.87–5.11)
RDW: 12.5 % (ref 11.5–15.5)
WBC: 8.5 10*3/uL (ref 4.0–10.5)
nRBC: 0 % (ref 0.0–0.2)

## 2020-10-05 LAB — MAGNESIUM: Magnesium: 2.3 mg/dL (ref 1.7–2.4)

## 2020-10-05 LAB — GLUCOSE, CAPILLARY
Glucose-Capillary: 154 mg/dL — ABNORMAL HIGH (ref 70–99)
Glucose-Capillary: 153 mg/dL — ABNORMAL HIGH (ref 70–99)
Glucose-Capillary: 151 mg/dL — ABNORMAL HIGH (ref 70–99)
Glucose-Capillary: 159 mg/dL — ABNORMAL HIGH (ref 70–99)

## 2020-10-05 LAB — COMPREHENSIVE METABOLIC PANEL
ALT: 61 U/L — ABNORMAL HIGH (ref 0–44)
AST: 20 U/L (ref 15–41)
Albumin: 4 g/dL (ref 3.5–5.0)
Alkaline Phosphatase: 49 U/L (ref 38–126)
Anion gap: 14 (ref 5–15)
BUN: 38 mg/dL — ABNORMAL HIGH (ref 6–20)
CO2: 23 mmol/L (ref 22–32)
Calcium: 9.6 mg/dL (ref 8.9–10.3)
Chloride: 107 mmol/L (ref 98–111)
Creatinine, Ser: 0.81 mg/dL (ref 0.44–1.00)
GFR, Estimated: >60 mL/min (ref >60)
Glucose, Bld: 155 mg/dL — ABNORMAL HIGH (ref 70–99)
Potassium: 3.3 mmol/L — ABNORMAL LOW (ref 3.5–5.1)
Sodium: 144 mmol/L (ref 135–145)
Total Bilirubin: 1.4 mg/dL — ABNORMAL HIGH (ref 0.3–1.2)
Total Protein: 6.9 g/dL (ref 6.5–8.1)

## 2020-10-05 LAB — ECHOCARDIOGRAM COMPLETE
AR max vel: 1.63 cm2
AV Area VTI: 1.92 cm2
AV Area mean vel: 1.64 cm2
AV Mean grad: 4.5 mmHg
AV Peak grad: 9.2 mmHg
Ao pk vel: 1.52 m/s
Area-P 1/2: 2.58 cm2
Height: 60 in
S' Lateral: 1.62 cm
Weight: 1428.58 oz

## 2020-10-05 LAB — T4, FREE: Free T4: 1.02 ng/dL (ref 0.61–1.12)

## 2020-10-05 LAB — HIV ANTIBODY (ROUTINE TESTING W REFLEX): HIV Screen 4th Generation wRfx: NONREACTIVE

## 2020-10-05 LAB — VITAMIN D 25 HYDROXY (VIT D DEFICIENCY, FRACTURES): Vit D, 25-Hydroxy: 19.95 ng/mL — ABNORMAL LOW (ref 30–100)

## 2020-10-05 LAB — VITAMIN B12: Vitamin B-12: 124 pg/mL — ABNORMAL LOW (ref 180–914)

## 2020-10-05 MED ORDER — NICOTINE 21 MG/24HR TD PT24
21.0000 mg | MEDICATED_PATCH | Freq: Every day | TRANSDERMAL | Status: DC
  Administered 2020-10-05 – 2020-10-07 (×3): 21 mg via TRANSDERMAL
  Filled 2020-10-05 (×3): qty 1

## 2020-10-05 MED ORDER — HYDRALAZINE HCL 20 MG/ML IJ SOLN
10.0000 mg | Freq: Once | INTRAMUSCULAR | Status: AC
  Administered 2020-10-05: 10 mg via INTRAVENOUS
  Filled 2020-10-05: qty 1

## 2020-10-05 MED ORDER — CLOPIDOGREL BISULFATE 75 MG PO TABS: 75.0000 mg | ORAL_TABLET | Freq: Every day | ORAL | Status: DC

## 2020-10-05 MED ORDER — CYANOCOBALAMIN 1000 MCG/ML IJ SOLN: 1000.0000 ug | Freq: Once | INTRAMUSCULAR | Status: DC

## 2020-10-05 MED ORDER — STROKE: EARLY STAGES OF RECOVERY BOOK: Freq: Once | Status: AC

## 2020-10-05 MED ORDER — CYANOCOBALAMIN 1000 MCG/ML IJ SOLN
1000.0000 ug | Freq: Once | INTRAMUSCULAR | Status: AC
  Administered 2020-10-05: 1000 ug via INTRAMUSCULAR
  Filled 2020-10-05: qty 1

## 2020-10-05 MED ORDER — ASPIRIN 325 MG PO TABS
325.0000 mg | ORAL_TABLET | Freq: Every day | ORAL | Status: DC
  Administered 2020-10-08 – 2020-10-11 (×4): 325 mg via ORAL
  Filled 2020-10-05 (×5): qty 1

## 2020-10-05 MED ORDER — LABETALOL HCL 5 MG/ML IV SOLN
20.0000 mg | INTRAVENOUS | Status: DC | PRN
  Administered 2020-10-05 – 2020-10-07 (×6): 20 mg via INTRAVENOUS
  Filled 2020-10-05 (×6): qty 4

## 2020-10-05 MED ORDER — GADOBUTROL 1 MMOL/ML IV SOLN
4.0000 mL | Freq: Once | INTRAVENOUS | Status: AC | PRN
  Administered 2020-10-05: 4 mL via INTRAVENOUS

## 2020-10-05 MED ORDER — ASPIRIN 300 MG RE SUPP
300.0000 mg | Freq: Every day | RECTAL | Status: DC
  Administered 2020-10-05 – 2020-10-12 (×4): 300 mg via RECTAL
  Filled 2020-10-05 (×4): qty 1

## 2020-10-05 MED ORDER — ATORVASTATIN CALCIUM 10 MG PO TABS: 20.0000 mg | ORAL_TABLET | Freq: Every day | ORAL | Status: DC

## 2020-10-05 MED ORDER — LABETALOL HCL 5 MG/ML IV SOLN
10.0000 mg | Freq: Once | INTRAVENOUS | Status: AC
  Administered 2020-10-05: 10 mg via INTRAVENOUS
  Filled 2020-10-05: qty 4

## 2020-10-05 NOTE — Evaluation (Signed)
Clinical/Bedside Swallow Evaluation Patient Details  Name: Judith Sullivan MRN: [id number] Date of Birth: 02/18/1961  Today's Date: 10/05/2020 Time: SLP Start Time (ACUTE ONLY): 1435 SLP Stop Time (ACUTE ONLY): 1452 SLP Time Calculation (min) (ACUTE ONLY): 17 min  Past Medical History:  Past Medical History:  Diagnosis Date  . Asthma with bronchitis   . Backache   . COPD (chronic obstructive pulmonary disease) (HCC)   . CTS (carpal tunnel syndrome)   . Hypertension   . Kidney stones    Past Surgical History:  Past Surgical History:  Procedure Laterality Date  . CESAREAN SECTION    . CHOLECYSTECTOMY    . kidney stone removal    . neck fusion    . SHOULDER SURGERY     HPI:  Judith Sullivan  is a 59 y.o. female, with history of diabetes mellitus type 2, hypertension, COPD, presents to the ER via EMS.  Patient will open eyes to voice, but is unable to provide any history at this time as she is currently nonverbal.  I personally called to speak with husband reports that this all started with her pain medication.  He reports that she had some neck surgery and has some chronic neck pain, and if she runs out of her pain medication she tries to sedate herself.  He reports that she may have taken all of her gabapentin over the weekend.  He was going on a camping trip with her grandson, and he does not know for sure.  He reports that when he returned he found her somnolent.  She had an empty bottle of gabapentin on the ground.  I attempted to call the pharmacy to find out how much gabapentin she had been in that bottle but pharmacy is closed.  If day team would like to follow-up pharmacy is [facility].  Husband reports patient remains somnolent all day Sunday.  On Monday she had vomited on herself, and when he started trying to clean her up she was combative.  He said it was like she did not know she had thrown up on herself.  He put her in the shower got her cleaned up, put her  back to bed, and she remained somnolent all through the night Sunday night, all through Monday, and then she was throwing up again today.  He decided that 3 days of somnolence, being nonverbal, not eating or drinking, not taking her medications, all in the setting of diabetes with too much and he decided to call EMS.  Husband reports that when patient threw up it was frothy, with phlegm, and undigested food.  He reports he tried to give her Coca-Cola, he thinks she was able to get it down, but then immediately came back up.  Of note husband does report that he found her bottle of Zanaflex and there was about 10 pills in the which was correct for when she last got it filled.  MRI reveals: "1. 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 Pt presents with severe/profound likely primary cognitive based dysphagia. Oral mech exam reveals edentulous status (Pt answered "yes" when asked if she wears dentures, however y/n is only 50% accurate per SLE), xerostomia, ability to cough on command but unable to swallow on command. Cough is congested when produced on command, however, H&P indicates Pt is still an everyday  smoker so this cough may be related to this. When presented with single ice chips, Pt appropriately took the ice chip in her oral cavity, demonstrated oral manipulation and then opened her mouth for it to fall out with NO SWALLOWING triggered. With various presentations attempted (tsp, cup and straw of thin) and one bite of applesauce, Pt again took the sip and apple sauce, demonstrated oral manipulation of bolus and then opened her mouth for it to fall out; she expectorated all attempted presentations with no swallowing trigger. Recommend continue NPO at this time; MBSS is not appropriate secondary to the fact that Pt is not swallowing any textures  at this time. ST will provide ongoing diagnostic dysphagia treatment tomorrow to determine recommendations and/or readiness for PO. Recommend meds to be administered via alternative means. Thank you, SLP Visit Diagnosis: Dysphagia, unspecified (R13.10)    Aspiration Risk  Severe aspiration risk;Risk for inadequate nutrition/hydration    Diet Recommendation NPO   Medication Administration: Via alternative means    Other  Recommendations Oral Care Recommendations: Oral care QID   Follow up Recommendations 24 hour supervision/assistance;Skilled Nursing facility      Frequency and Duration min 2x/week  1 week       Prognosis Prognosis for Safe Diet Advancement: Fair Barriers to Reach Goals: Cognitive deficits;Severity of deficits      Swallow Study   General Date of Onset: 10/04/20 HPI: Judith Sullivan  is a 59 y.o. female, with history of diabetes mellitus type 2, hypertension, COPD, presents to the ER via EMS.  Patient will open eyes to voice, but is unable to provide any history at this time as she is currently nonverbal.  I personally called to speak with husband reports that this all started with her pain medication.  He reports that she had some neck surgery and has some chronic neck pain, and if she runs out of her pain medication she tries to sedate herself.  He reports that she may have taken all of her gabapentin over the weekend.  He was going on a camping trip with her grandson, and he does not know for sure.  He reports that when he returned he found her somnolent.  She had an empty bottle of gabapentin on the ground.  I attempted to call the pharmacy to find out how much gabapentin she had been in that bottle but pharmacy is closed.  If day team would like to follow-up pharmacy is [facility].  Husband reports patient remains somnolent all day Sunday.  On Monday she had vomited on herself, and when he started trying to clean her up she was combative.  He said  it was like she did not know she had thrown up on herself.  He put her in the shower got her cleaned up, put her back to bed, and she remained somnolent all through the night Sunday night, all through Monday, and then she was throwing up again today.  He decided that 3 days of somnolence, being nonverbal, not eating or drinking, not taking her medications, all in the setting of diabetes with too much and he decided to call EMS.  Husband reports that when patient threw up it was frothy, with phlegm, and undigested food.  He reports he tried to give her Coca-Cola, he thinks she was able to get it down, but then immediately came back up.  Of note husband does report that he found her bottle of Zanaflex and there was about 10 pills  in the which was correct for when she last got it filled.  MRI reveals: "1. 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 Type of Study: Bedside Swallow Evaluation Previous Swallow Assessment: none in chart Diet Prior to this Study: NPO Temperature Spikes Noted: No Respiratory Status: Room air History of Recent Intubation: No Behavior/Cognition: Alert;Cooperative;Pleasant mood;Confused;Requires cueing;Doesn't follow directions Oral Cavity Assessment: Dry Oral Care Completed by SLP: Recent completion by staff Oral Cavity - Dentition: Edentulous Vision: Functional for self-feeding Volitional Cough: Strong;Congested Volitional Swallow: Unable to elicit    Oral/Motor/Sensory Function Overall Oral Motor/Sensory Function: Within functional limits   Ice Chips Ice chips: Impaired Presentation: Spoon Oral Phase Impairments: Poor awareness of bolus;Impaired mastication Pharyngeal Phase Impairments: Unable to trigger swallow   Thin Liquid Thin Liquid: Impaired Presentation: Spoon Oral Phase Impairments: Poor awareness of bolus;Reduced lingual movement/coordination Pharyngeal  Phase Impairments: Unable to trigger swallow    Nectar Thick Nectar Thick Liquid: Not  tested   Honey Thick Honey Thick Liquid: Not tested   Puree Puree: Impaired Presentation: Spoon Oral Phase Impairments: Poor awareness of bolus;Reduced lingual movement/coordination Pharyngeal Phase Impairments: Unable to trigger swallow   Solid     Solid: Not tested     Judith Sullivan H. Romie Levee, CCC-SLP Speech Language Pathologist  Georgetta Haber 10/05/2020,4:53 PM

## 2020-10-05 NOTE — Progress Notes (Signed)
*  PRELIMINARY RESULTS* Echocardiogram 2D Echocardiogram has been performed.  Stacey Drain 10/05/2020, 4:02 PM

## 2020-10-05 NOTE — Progress Notes (Signed)
Patient's BP remain elevated even after scheduled and PRN BP meds are administered. Patient resting in bed watching TV. No apparent distress at this time. Denies pain or discomfort. On-call clinician M. Katherina Right made aware via Amion.

## 2020-10-05 NOTE — Evaluation (Signed)
Speech Language Pathology Evaluation Patient Details Name: Judith Sullivan MRN: [id number] DOB: 05/21/1961 Today's Date: 10/05/2020 Time: 4332-9518 SLP Time Calculation (min) (ACUTE ONLY): 21 min  Problem List:  Patient Active Problem List   Diagnosis Date Noted  . AMS (altered mental status) 10/05/2020  . Metabolic encephalopathy 10/04/2020  . 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 SCIATICA 01/28/2008   Past Medical History:  Past Medical History:  Diagnosis Date  . Asthma with bronchitis   . Backache   . COPD (chronic obstructive pulmonary disease) (HCC)   . CTS (carpal tunnel syndrome)   . Hypertension   . Kidney stones    Past Surgical History:  Past Surgical History:  Procedure Laterality Date  . CESAREAN SECTION    . CHOLECYSTECTOMY    . kidney stone removal    . neck fusion    . SHOULDER SURGERY     HPI:  Judith Sullivan  is a 59 y.o. female, with history of diabetes mellitus type 2, hypertension, COPD, presents to the ER via EMS.  Patient will open eyes to voice, but is unable to provide any history at this time as she is currently nonverbal.  I personally called to speak with husband reports that this all started with her pain medication.  He reports that she had some neck surgery and has some chronic neck pain, and if she runs out of her pain medication she tries to sedate herself.  He reports that she may have taken all of her gabapentin over the weekend.  He was going on a camping trip with her grandson, and he does not know for sure.  He reports that when he returned he found her somnolent.  She had an empty bottle of gabapentin on the ground.  I attempted to call the pharmacy to find out how much gabapentin she had been in that bottle but pharmacy is closed.  If day team would like to follow-up pharmacy is [facility].  Husband reports patient remains somnolent all day Sunday.  On Monday she had vomited on herself, and when he started trying to clean her up she  was combative.  He said it was like she did not know she had thrown up on herself.  He put her in the shower got her cleaned up, put her back to bed, and she remained somnolent all through the night Sunday night, all through Monday, and then she was throwing up again today.  He decided that 3 days of somnolence, being nonverbal, not eating or drinking, not taking her medications, all in the setting of diabetes with too much and he decided to call EMS.  Husband reports that when patient threw up it was frothy, with phlegm, and undigested food.  He reports he tried to give her Coca-Cola, he thinks she was able to get it down, but then immediately came back up.  Of note husband does report that he found her bottle of Zanaflex and there was about 10 pills in the which was correct for when she last got it filled.  MRI reveals: "1. 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 Pt was reportedly not speaking prior to SLP arrival today. Characteristics noted were verbal perseveration, inability to name basic objects in the room, inability to call the  names of people close to her (husband or children), when given a binary choice of two objects (one option being blatantly outlandish i.e. hot dog or hand) Pt chose the incorrect object name with every attempt, Pt answered basic biographical y/n questions with 50% accuracy; Pt followed one step commands but was unable to follow more than one step. Strengths presented: Pt could repeat sentences of up to 7-8 words in length, pointed to objects/shapes appropriately when word was stated, Pt read presented basic sentences. Pt will benefit from ongoing ST treatment for the above impairments and a more indepth SLE to be completed at next level of care. ST will  continue to follow acutely, thank you.    SLP Assessment  SLP Visit Diagnosis: Dysphagia, unspecified (R13.10)    Follow Up Recommendations  24 hour supervision/assistance;Skilled Nursing facility    Frequency and Duration min 2x/week  2 weeks      SLP Evaluation Cognition  Overall Cognitive Status: No family/caregiver present to determine baseline cognitive functioning Arousal/Alertness: Awake/alert Orientation Level: Disoriented X4       Comprehension       Expression Expression Primary Mode of Expression: Verbal Verbal Expression Overall Verbal Expression: Impaired Automatic Speech: Social Response;Counting;Day of week Level of Generative/Spontaneous Verbalization: Word Repetition: No impairment Naming: Impairment Responsive: 0-25% accurate Confrontation: Within functional limits Convergent: 0-24% accurate Divergent: 0-24% accurate Written Expression Dominant Hand: Left   Oral / Motor  Oral Motor/Sensory Function Overall Oral Motor/Sensory Function: Within functional limits     Casandra Dallaire H. Romie Levee, CCC-SLP Speech Language Pathologist         Georgetta Haber 10/05/2020, 5:12 PM

## 2020-10-05 NOTE — Plan of Care (Signed)
  Problem: SLP Dysphagia Goals Goal: Patient will demonstrate readiness for PO's Description: Patient will demonstrate readiness for PO's and/or instrumental swallow study as evidenced by: Flowsheets (Taken 10/05/2020 1711) Patient will demonstrate readiness for PO's and/or instrumental swallow study as evidenced by: with max assist   Problem: SLP Language Goals Goal: Patient will communicate needs/wants with Flowsheets (Taken 10/05/2020 1710) Patient will communicate ____  needs/wants with: max assist Goal: Patient will follow commands during a functional ADL with Flowsheets (Taken 10/05/2020 1710) Patient will follow ____ commands during a functional ADL with ____: max assist   Tsugio Elison H. Romie Levee, CCC-SLP Speech Language Pathologist

## 2020-10-05 NOTE — Evaluation (Signed)
Physical Therapy Evaluation Patient Details Name: Judith Sullivan MRN: [id number] DOB: 05-Sep-1961 Today's Date: 10/05/2020   History of Present Illness  Judith Sullivan  is a 59 y.o. female, with history of diabetes mellitus type 2, hypertension, COPD, presents to the ER via EMS.  Patient will open eyes to voice, but is unable to provide any history at this time as she is currently nonverbal.  I personally called to speak with husband reports that this all started with her pain medication.  He reports that she had some neck surgery and has some chronic neck pain, and if she runs out of her pain medication she tries to sedate herself.  He reports that she may have taken all of her gabapentin over the weekend.  He was going on a camping trip with her grandson, and he does not know for sure.  He reports that when he returned he found her somnolent.  She had an empty bottle of gabapentin on the ground.  I attempted to call the pharmacy to find out how much gabapentin she had been in that bottle but pharmacy is closed.  If day team would like to follow-up pharmacy is [facility].  Husband reports patient remains somnolent all day Sunday.  On Monday she had vomited on herself, and when he started trying to clean her up she was combative.  He said it was like she did not know she had thrown up on herself.  He put her in the shower got her cleaned up, put her back to bed, and she remained somnolent all through the night Sunday night, all through Monday, and then she was throwing up again today.  He decided that 3 days of somnolence, being nonverbal, not eating or drinking, not taking her medications, all in the setting of diabetes with too much and he decided to call EMS.  Husband reports that when patient threw up it was frothy, with phlegm, and undigested food.  He reports he tried to give her Coca-Cola, he thinks she was able to get it down, but then immediately came back up.  Of note husband  does report that he found her bottle of Zanaflex and there was about 10 pills in the which was correct for when she last got it filled.    Clinical Impression  Patient presents with right wrist brace (unclear from patient how long she has been wearing).  Patient demonstrates slow labored movement for sitting up at bedside with limited use of RUE due to brace, tends to lean forward once seated at bedside, requires frequent repeated verbal/tactile curing to follow instructions with fair carryover, slow unsteady labored cadence requiring use of RW, on room air with SpO2 at 92-93% after sitting in chair.  Patient tolerated staying up in chair with speech therapist in room.  Patient will benefit from continued physical therapy in hospital and recommended venue below to increase strength, balance, endurance for safe ADLs and gait.     Follow Up Recommendations SNF;Supervision for mobility/OOB;Supervision/Assistance - 24 hour    Equipment Recommendations  Rolling walker with 5" wheels    Recommendations for Other Services       Precautions / Restrictions Precautions Precautions: Fall Restrictions Weight Bearing Restrictions: No      Mobility  Bed Mobility Overal bed mobility: Needs Assistance Bed Mobility: Supine to Sit     Supine to sit: Mod assist     General bed mobility comments: increased time labored movement, limited use of RUE  due to wrist brace    Transfers Overall transfer level: Needs assistance Equipment used: None;Rolling walker (2 wheeled) Transfers: Sit to/from [employer] Sit to Stand: Min assist;Mod assist Stand pivot transfers: Min assist;Mod assist       General transfer comment: very unsteady on feet, required use of RW for safety  Ambulation/Gait Ambulation/Gait assistance: Min assist;Mod assist Gait Distance (Feet): 35 Feet Assistive device: Rolling walker (2 wheeled) Gait Pattern/deviations: Decreased step length - right;Decreased step  length - left;Decreased stride length Gait velocity: decreased   General Gait Details: slow labored unsteady cadence requiring frequent verbal/tactile cueing and increased time to make turns  Information systems manager [employer] (Stroke Patients Only)       Balance Overall balance assessment: Needs assistance Sitting-balance support: Feet supported;No upper extremity supported Sitting balance-Leahy Scale: Fair Sitting balance - Comments: seated at EOB, occasional leaning forward   Standing balance support: During functional activity;No upper extremity supported Standing balance-Leahy Scale: Poor Standing balance comment: fair using RW                             Pertinent Vitals/Pain Pain Assessment: No/denies pain    Home Living Family/patient expects to be discharged to:: Private residence Living Arrangements: Spouse/significant other Available Help at Discharge: Family;Available 24 hours/day Type of Home: House Home Access: Level entry     Home Layout: One level Home Equipment: None Additional Comments: info per patient who may be a poor historian    Prior Function Level of Independence: Independent         Comments: household ambulator without AD     Hand Dominance   Dominant Hand: Left    Extremity/Trunk Assessment   Upper Extremity Assessment Upper Extremity Assessment: Defer to OT evaluation    Lower Extremity Assessment Lower Extremity Assessment: Generalized weakness    Cervical / Trunk Assessment Cervical / Trunk Assessment: Normal  Communication   Communication: Receptive difficulties;Expressive difficulties;Other (comment) (Slow to answer questions and able to follow directions approximately 70% of time)  Cognition Arousal/Alertness: Awake/alert Behavior During Therapy: WFL for tasks assessed/performed Overall Cognitive Status: No family/caregiver present to determine baseline cognitive functioning                                         General Comments      Exercises     Assessment/Plan    PT Assessment Patient needs continued PT services  PT Problem List Decreased strength;Decreased activity tolerance;Decreased balance;Decreased mobility;Decreased cognition       PT Treatment Interventions DME instruction;Gait training;Stair training;Functional mobility training;Therapeutic activities;Therapeutic exercise;Patient/family education;Balance training    PT Goals (Current goals can be found in the Care Plan section)  Acute Rehab PT Goals Patient Stated Goal: return home with family to assist PT Goal Formulation: With patient Time For Goal Achievement: 10/19/20 Potential to Achieve Goals: Good    Frequency Min 5X/week   Barriers to discharge        Co-evaluation               AM-PAC PT "6 Clicks" Mobility  Outcome Measure Help needed turning from your back to your side while in a flat bed without using bedrails?: A Little Help needed moving from lying on your back to sitting on  the side of a flat bed without using bedrails?: A Lot Help needed moving to and from a bed to a chair (including a wheelchair)?: A Little Help needed standing up from a chair using your arms (e.g., wheelchair or bedside chair)?: A Little Help needed to walk in hospital room?: A Lot Help needed climbing 3-5 steps with a railing? : A Lot 6 Click Score: 15    End of Session   Activity Tolerance: Patient tolerated treatment well;Patient limited by fatigue Patient left: in chair;with chair alarm set;with call bell/phone within reach;Other (comment) (speech therapist working with patient) Nurse Communication: Mobility status PT Visit Diagnosis: Unsteadiness on feet (R26.81);Other abnormalities of gait and mobility (R26.89);Muscle weakness (generalized) (M62.81)    Time: 1410-1444 PT Time Calculation (min) (ACUTE ONLY): 34 min   Charges:   PT Evaluation $PT Eval Moderate  Complexity: 1 Mod PT Treatments $Therapeutic Activity: 23-37 mins        3:38 PM, 10/05/20 Ocie Bob, MPT Physical Therapist with [facility] [phone] office [phone] mobile phone

## 2020-10-05 NOTE — Plan of Care (Signed)
  Problem: Acute Rehab PT Goals(only PT should resolve) Goal: Pt Will Go Supine/Side To Sit Outcome: Progressing Flowsheets (Taken 10/05/2020 1540) Pt will go Supine/Side to Sit: with supervision Goal: Patient Will Transfer Sit To/From Stand Outcome: Progressing Flowsheets (Taken 10/05/2020 1540) Patient will transfer sit to/from stand: with min guard assist Goal: Pt Will Transfer Bed To Chair/Chair To Bed Outcome: Progressing Flowsheets (Taken 10/05/2020 1540) Pt will Transfer Bed to Chair/Chair to Bed: min guard assist Goal: Pt Will Ambulate Outcome: Progressing Flowsheets (Taken 10/05/2020 1540) Pt will Ambulate:  75 feet  with min guard assist  with minimal assist  with least restrictive assistive device   3:40 PM, 10/05/20 Ocie Bob, MPT Physical Therapist with [facility] [phone] office [phone] mobile phone

## 2020-10-05 NOTE — Progress Notes (Signed)
PROGRESS NOTE    Judith Sullivan  NOI:[id number] DOB: [birth year] DOA: 10/04/2020 PCP: Ignatius Specking, MD   Chief Complaint  Patient presents with  . Altered Mental Status    Brief Narrative:  As per H&P written by Dr. Carren Rang on 10/04/20 Judith Sullivan  is a 59 y.o. female, with history of diabetes mellitus type 2, hypertension, COPD, presents to the ER via EMS.  Patient will open eyes to voice, but is unable to provide any history at this time as she is currently nonverbal.  I personally called to speak with husband reports that this all started with her pain medication.  He reports that she had some neck surgery and has some chronic neck pain, and if she runs out of her pain medication she tries to sedate herself.  He reports that she may have taken all of her gabapentin over the weekend.  He was going on a camping trip with her grandson, and he does not know for sure.  He reports that when he returned he found her somnolent.  She had an empty bottle of gabapentin on the ground.  I attempted to call the pharmacy to find out how much gabapentin she had been in that bottle but pharmacy is closed.  If day team would like to follow-up pharmacy is [facility] [place].  Husband reports patient remains somnolent all day Sunday.  On Monday she had vomited on herself, and when he started trying to clean her up she was combative.  He said it was like she did not know she had thrown up on herself.  He put her in the shower got her cleaned up, put her back to bed, and she remained somnolent all through the night Sunday night, all through Monday, and then she was throwing up again today.  He decided that 3 days of somnolence, being nonverbal, not eating or drinking, not taking her medications, all in the setting of diabetes with too much and he decided to call EMS.  Husband reports that when patient threw up it was frothy, with phlegm, and undigested food.  He reports he tried to give her  Coca-Cola, he thinks she was able to get it down, but then immediately came back up.  Of note husband does report that he found her bottle of Zanaflex and there was about 10 pills in the which was correct for when she last got it filled.  In the ED patient initially seemed to be slowly clearing her mental status.  The day time ED provider reported that she was able to say her name to him.  He thought that she would start to wake up and then be able to go home.  Second shift ED provider noted that after several more hours, patient still was drooling/bubbling at the mouth, nonverbal, barely following commands, and just not safe to send home.  Admission was requested for further work-up and management of acute metabolic encephalopathy.  Temperature 98.5, heart rate 101, respiratory rate 31, blood pressure 182/82, satting at 94% on room air VBG shows a pH of 7.48, PO2 of 66, PCO2 of 31.6 CBC shows white blood cell count of 8.3, hemoglobin 16.9 CHEM panel is unremarkable with a glucose of 201 Slightly elevated gap at 17, but bicarb is 23, pH is 7.48 Slightly elevated transaminases with an AST of 46 T bili slightly elevated at 1.3 Lactic acid is within normal limits at 1.4 EKG shows a heart rate of 82, sinus rhythm,  QTC of 461 UA shows glucosuria greater than 500, and 80 ketones -not indicative of UTI patient will obtain x-ray  Assessment & Plan: 1-acute metabolic encephalopathy secondary to acute/subacute cerebral infarct -Stroke -Started on aspirin for secondary prevention -Will check A1c and lipid panel -Neurology service has been consulted and will follow recommendations -Check 2D echo and carotid Dopplers -Follow PT, OT and speech therapy evaluation. -Checking B12 and vitamin D level  2-abnormal TSH -Will check for no prior history of thyroid disorder. -TSH was low.  3-type 2 diabetes mellitus -Continue sliding scale insulin -holding oral hypoglycemic -Follow  A1c  4-hypertension -Will be permissive in the setting of acute CVA -Continue metoprolol/lisinopril  5-HLD -will check lipid panel -will use lipitor 20 mg.  6-hx of COPD -no wheezing and no SOB. -continue to monitor.   DVT prophylaxis: Heparin Code Status: Full code Family Communication: Husband updated over the phone. 10/05/20 Disposition:   Status is: Inpatient  Dispo: The patient is from: home               Anticipated d/c is to: To be determined.              Anticipated d/c date is: 1-2 days.              Patient currently No medically stable for discharge; MRI has demonstrated acute/subacute ischemic infarcts affecting bilateral posterior cerebral convexities and will need to complete a stroke work-up.  Her mentation still not back to baseline and is by able to follow commands and answer simple questions is having acute difficulties with her swallowing function.     Consultants:   Neurology    Procedures:   See below for x-ray reports   2-D echo: pending  MRI/MRa: old lacunar infarcts and acute/subacute bilateral posterior cerebral convexities.   Antimicrobials:  None   Subjective: Patient still not back to baseline regarding mentation and having difficulties swallowing.   Objective: Vitals:   10/04/20 2324 10/05/20 0346 10/05/20 0419 10/05/20 0500  BP: (!) 189/83 (!) 195/81 (!) 192/89   Pulse: 64 79 79   Resp: 17 20 18    Temp:  98.3 F (36.8 C) 98.9 F (37.2 C)   TempSrc:  Oral Oral   SpO2:  90% 90%   Weight:    40.5 kg  Height:        Intake/Output Summary (Last 24 hours) at 10/05/2020 1229 Last data filed at 10/04/2020 2104 Gross per 24 hour  Intake 250 ml  Output --  Net 250 ml   Filed Weights   10/04/20 1036 10/05/20 0500  Weight: 45.4 kg 40.5 kg    Examination:  General exam: Afebrile, no CP, no nausea, no vomiting. Able to follow simple commands but slow to respond. Patient with some difficulties swallowing appreciated by  nurse at bedside. Respiratory system: Clear to auscultation. Respiratory effort normal. Cardiovascular system: S1 & S2 heard, RRR. No JVD, no rub or gallops. No LE edema. Gastrointestinal system: Abdomen is nondistended, soft and nontender. No organomegaly or masses felt. Normal bowel sounds heard. Central nervous system: Alert and oriented. No focal neurological deficits. Extremities: no cyanosis or clubbing. Skin: No petechiae. Psychiatry: Mood & affect appropriate.     Data Reviewed: I have personally reviewed following labs and imaging studies  CBC: Recent Labs  Lab 10/04/20 1113 10/04/20 1120 10/05/20 0320  WBC  --  8.3 8.5  NEUTROABS  --  7.5 6.9  HGB 16.3* 16.9* 16.1*  HCT 48.0* 48.7* 47.1*  MCV  --  92.8 94.4  PLT  --  336 327    Basic Metabolic Panel: Recent Labs  Lab 10/04/20 1113 10/04/20 1120 10/05/20 0320  NA 142 140 144  K 3.6 3.7 3.3*  CL 104 100 107  CO2  --  23 23  GLUCOSE 204* 201* 155*  BUN 35* 37* 38*  CREATININE 0.90 0.93 0.81  CALCIUM  --  10.0 9.6  MG  --   --  2.3    GFR: Estimated Creatinine Clearance: 48.4 mL/min (by C-G formula based on SCr of 0.81 mg/dL).  Liver Function Tests: Recent Labs  Lab 10/04/20 1120 10/05/20 0320  AST 46* 20  ALT 92* 61*  ALKPHOS 56 49  BILITOT 1.3* 1.4*  PROT 7.7 6.9  ALBUMIN 4.4 4.0    CBG: Recent Labs  Lab 10/04/20 2013 10/05/20 0720 10/05/20 1113  GLUCAP 196* 154* 153*     Recent Results (from the past 240 hour(s))  Respiratory Panel by RT PCR (Flu A&B, Covid) - Nasopharyngeal Swab     Status: None   Collection Time: 10/04/20 11:46 AM   Specimen: Nasopharyngeal Swab  Result Value Ref Range Status   SARS Coronavirus 2 by RT PCR NEGATIVE NEGATIVE Final    Comment: (NOTE) SARS-CoV-2 target nucleic acids are NOT DETECTED.  The SARS-CoV-2 RNA is generally detectable in upper respiratoy specimens during the acute phase of infection. The lowest concentration of SARS-CoV-2 viral copies  this assay can detect is 131 copies/mL. A negative result does not preclude SARS-Cov-2 infection and should not be used as the sole basis for treatment or other patient management decisions. A negative result may occur with  improper specimen collection/handling, submission of specimen other than nasopharyngeal swab, presence of viral mutation(s) within the areas targeted by this assay, and inadequate number of viral copies (<131 copies/mL). A negative result must be combined with clinical observations, patient history, and epidemiological information. The expected result is Negative.  Fact Sheet for Patients:  https://www.Fukuda.com/https://www.fda.gov/media/142436/download  Fact Sheet for Healthcare Providers:  https://www.young.biz/https://www.fda.gov/media/142435/download  This test is no t yet approved or cleared by the [place] FDA and  has been authorized for detection and/or diagnosis of SARS-CoV-2 by FDA under an Emergency Use Authorization (EUA). This EUA will remain  in effect (meaning this test can be used) for the duration of the COVID-19 declaration under Section 564(b)(1) of the Act, 21 U.S.C. section 360bbb-3(b)(1), unless the authorization is terminated or revoked sooner.     Influenza A by PCR NEGATIVE NEGATIVE Final   Influenza B by PCR NEGATIVE NEGATIVE Final    Comment: (NOTE) The Xpert Xpress SARS-CoV-2/FLU/RSV assay is intended as an aid in  the diagnosis of influenza from Nasopharyngeal swab specimens and  should not be used as a sole basis for treatment. Nasal washings and  aspirates are unacceptable for Xpert Xpress SARS-CoV-2/FLU/RSV  testing.  Fact Sheet for Patients: https://www.Kings.com/https://www.fda.gov/media/142436/download  Fact Sheet for Healthcare Providers: https://www.young.biz/https://www.fda.gov/media/142435/download  This test is not yet approved or cleared by the [place] FDA and  has been authorized for detection and/or diagnosis of SARS-CoV-2 by  FDA under an Emergency Use Authorization (EUA). This EUA  will remain  in effect (meaning this test can be used) for the duration of the  Covid-19 declaration under Section 564(b)(1) of the Act, 21  U.S.C. section 360bbb-3(b)(1), unless the authorization is  terminated or revoked. Performed at [facility], [address] [place], [place] [address]      Radiology Studies: CT  HEAD WO CONTRAST  Result Date: 10/04/2020 CLINICAL DATA:  Altered mental status. EXAM: CT HEAD WITHOUT CONTRAST TECHNIQUE: Contiguous axial images were obtained from the base of the skull through the vertex without intravenous contrast. COMPARISON:  Brain MRI 10/23/2018. FINDINGS: Brain: No evidence of acute infarction, hemorrhage, hydrocephalus, extra-axial collection or mass lesion/mass effect. Vascular: Atherosclerosis.  No hyperdense vessel. Skull: Intact.  No focal lesion. Sinuses/Orbits: Negative. Other: None. IMPRESSION: No acute abnormality. Atherosclerosis. Electronically Signed   By: Drusilla Kanner M.D.   On: 10/04/2020 11:51   MR ANGIO HEAD WO CONTRAST  Result Date: 10/05/2020 CLINICAL DATA:  Mental status change with unknown cause EXAM: MRI HEAD WITHOUT AND WITH CONTRAST MRA HEAD WITHOUT CONTRAST TECHNIQUE: Multiplanar, multiecho pulse sequences of the brain and surrounding structures were obtained without and with intravenous contrast. Angiographic images of the head were obtained using MRA technique without contrast. CONTRAST:  50mL GADAVIST GADOBUTROL 1 MMOL/ML IV SOLN COMPARISON:  Head CT from yesterday FINDINGS: MRI HEAD FINDINGS Brain: Punctate acute to subacute infarcts in the bilateral occipital cortex (see coronal diffusion), bilateral parietal white matter, and right posterior frontal white matter. Chronic lacunes are seen in the left corona radiata and bilateral thalamus. No acute hemorrhage, hydrocephalus, or masslike finding. No abnormal intracranial enhancement. Vascular: Normal flow voids Skull and upper cervical spine: Normal marrow signal Sinuses/Orbits:  Negative MRA HEAD FINDINGS The vertebral and basilar arteries are smooth and widely patent. Symmetric carotid siphons which are diffusely patent. There is presumably atheromatous (given vascular risk factors) irregularities of bilateral MCA and PCA branches. A left M2 branch appears cut off on the reformats but there are small emanating vessels on source images. The stenoses are moderate to advanced in severity. Negative for aneurysm IMPRESSION: Brain MRI: 1. 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 cerebral convexities. 2. Remote lacunar infarcts in the left corona radiata and bilateral thalamus. 3. Motion degraded. Intracranial MRA: Multifocal medium-sized vessel narrowings, presumably atheromatous. Most notable is a severe proximal left M2 branch stenosis. Electronically Signed   By: Marnee Spring M.D.   On: 10/05/2020 11:28   MR BRAIN W WO CONTRAST  Result Date: 10/05/2020 CLINICAL DATA:  Mental status change with unknown cause EXAM: MRI HEAD WITHOUT AND WITH CONTRAST MRA HEAD WITHOUT CONTRAST TECHNIQUE: Multiplanar, multiecho pulse sequences of the brain and surrounding structures were obtained without and with intravenous contrast. Angiographic images of the head were obtained using MRA technique without contrast. CONTRAST:  66mL GADAVIST GADOBUTROL 1 MMOL/ML IV SOLN COMPARISON:  Head CT from yesterday FINDINGS: MRI HEAD FINDINGS Brain: Punctate acute to subacute infarcts in the bilateral occipital cortex (see coronal diffusion), bilateral parietal white matter, and right posterior frontal white matter. Chronic lacunes are seen in the left corona radiata and bilateral thalamus. No acute hemorrhage, hydrocephalus, or masslike finding. No abnormal intracranial enhancement. Vascular: Normal flow voids Skull and upper cervical spine: Normal marrow signal Sinuses/Orbits: Negative MRA HEAD FINDINGS The vertebral and basilar arteries are smooth and widely patent. Symmetric carotid siphons  which are diffusely patent. There is presumably atheromatous (given vascular risk factors) irregularities of bilateral MCA and PCA branches. A left M2 branch appears cut off on the reformats but there are small emanating vessels on source images. The stenoses are moderate to advanced in severity. Negative for aneurysm IMPRESSION: Brain MRI: 1. 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 cerebral convexities. 2. Remote lacunar infarcts in the left corona radiata and bilateral thalamus. 3. Motion degraded. Intracranial MRA: Multifocal  medium-sized vessel narrowings, presumably atheromatous. Most notable is a severe proximal left M2 branch stenosis. Electronically Signed   By: Marnee Spring M.D.   On: 10/05/2020 11:28   DG CHEST PORT 1 VIEW  Result Date: 10/04/2020 CLINICAL DATA:  Metabolic encephalopathy.  Smoker. EXAM: PORTABLE CHEST 1 VIEW COMPARISON:  Report 03/12/2018 FINDINGS: Numerous leads and wires project over the chest. Cervical spine fixation. Apical lordotic positioning. Midline trachea. Normal heart size. No pleural effusion or pneumothorax. Moderate hyperinflation. Clear lungs. IMPRESSION: Hyperinflation, without acute disease. Electronically Signed   By: Jeronimo Greaves M.D.   On: 10/04/2020 19:43    Scheduled Meds: .  stroke: mapping our early stages of recovery book   Does not apply Once  . aspirin  300 mg Rectal Daily   Or  . aspirin  325 mg Oral Daily  . feeding supplement  237 mL Oral BID BM  . heparin  5,000 Units Subcutaneous Q8H  . insulin aspart  0-15 Units Subcutaneous TID WC  . insulin aspart  0-5 Units Subcutaneous QHS  . lisinopril  20 mg Oral Daily  . metoprolol tartrate  50 mg Oral BID   Continuous Infusions:   LOS: 0 days    Time spent: 35 minutes.   Vassie Loll, MD [facility]   To contact the attending provider between 7A-7P or the covering provider during after hours 7P-7A, please log into the web site [email or web address] and access  using universal [place] password for that web site. If you do not have the password, please call the hospital operator.  10/05/2020, 12:29 PM

## 2020-10-05 NOTE — TOC Initial Note (Signed)
Transition of Care [facility]) - Initial/Assessment Note    Patient Details  Name: Judith Sullivan MRN: [id number] Date of Birth: May 14, 1961  Transition [facility] CM/SW Contact:    Annice Needy, LCSW Phone Number: 10/05/2020, 4:27 PM  Clinical Narrative:                 Patient from home with spouse. Admitted with AMS. Recommended for SNF discussed with spouse. Mr. Florendo does not desire SNF placement. Patient is independent at baseline and drives.  Mr. Mcsorley is currently agreeable to [facility].  Expected Discharge Plan: Home w Home Health Services Barriers to Discharge: Continued Medical Work up   Patient Goals and CMS Choice Patient states their goals for this hospitalization and ongoing recovery are:: home with [facility]      Expected Discharge Plan and Services Expected Discharge Plan: Home w Home Health Services     Post Acute Care Choice: Home Health Living arrangements for the past 2 months: Single Family Home                                      Prior Living Arrangements/Services Living arrangements for the past 2 months: Single Family Home Lives with:: Spouse Patient language and need for interpreter reviewed:: Yes Do you feel safe going back to the place where you live?: Yes      Need for Family Participation in Patient Care: Yes (Comment) Care giver support system in place?: Yes (comment)   Criminal Activity/Legal Involvement Pertinent to Current Situation/Hospitalization: No - Comment as needed  Activities of Daily Living      Permission Sought/Granted Permission sought to share information with : Family Supports    Share Information with NAME: Francesa Eugenio     Permission granted to share info w Relationship: spouse     Emotional Assessment Appearance:: Appears younger than stated age   Affect (typically observed): Appropriate Orientation: : Oriented to Self, Oriented to Place, Oriented to  Time, Oriented to Situation Alcohol / Substance Use: Not  Applicable Psych Involvement: No (comment)  Admission diagnosis:  Metabolic encephalopathy [G93.41] Altered mental status, unspecified altered mental status type [R41.82] AMS (altered mental status) [R41.82] Patient Active Problem List   Diagnosis Date Noted  . AMS (altered mental status) 10/05/2020  . Metabolic encephalopathy 10/04/2020  . 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 SCIATICA 01/28/2008   PCP:  Ignatius Specking, MD Pharmacy:   [facility] - [place], [place] - [address] [place] [address] Phone: [phone] Fax: [phone]     Social Determinants of Health (SDOH) Interventions    Readmission Risk Interventions No flowsheet data found.

## 2020-10-05 NOTE — Progress Notes (Signed)
Initial Nutrition Assessment  DOCUMENTATION CODES:   Underweight  INTERVENTION:  SLP consult, pending BSE  -Continue Ensure Enlive po BID, each supplement provides 350 kcal and 20 grams of protein -MVI with minerals daily  -Recommend monitoring magnesium, potassium, and phosphorus daily as pt is at risk for refeeding given report of no po intake 3 days prior to admission, hypokalemia per labs   NUTRITION DIAGNOSIS:   Inadequate oral intake related to lethargy/confusion as evidenced by per patient/family report (as per HPI).    GOAL:   Patient will meet greater than or equal to 90% of their needs    MONITOR:   Labs, I & O's, Supplement acceptance, PO intake, Weight trends  REASON FOR ASSESSMENT:   Malnutrition Screening Tool    ASSESSMENT:  RD working remotely.   59 year old female with history of DM2, HTN, COPD, asthma, and chronic neck pain presented with 3 day history of altered mental status possibly due to gabapentin overdose.  Patient admitted with acute metabolic encephalopathy.  Per notes, pt arrived via EMS nonverbal with last known to be at baseline 3+ days ago. ED provider spoke with husband who reports returning home from a camping trip on Sunday finding pt somnolent with an empty bottle of Gabapentin on the ground. Pt takes pain medication for chronic neck pain, if she runs out pt tries to sedate herself. Pt remained somnolent over the next few days, noted nonverbal, not eating/drinking or taking medications and husband reports episodes of vomiting described as frothy with phlegm and undigested food. No PO medications this morning due to swallowing difficulties per medication review, SLP consult pending. She is currently on CM diet, no documented intakes at this time, will continue to monitor. Pt is at risk for refeeding given report of no oral intake 3 days prior to presenting, recommend monitoring magnesium, potassium, and phosphorus daily as po intake  improves, MD to replete as needed.  Limited wt history for review. Per encounters, pt weighed 50.2 kg (110.44 lbs) on 03/15/20. Currently she weighs 40.5 kg (89.1 lbs). This indicates ~21 lb (19.3%) wt loss in the last 6 months; significant. Pt is underweight and given trends, highly suspect degree of malnutrition, however unable to identify at this time. Will plan to complete exam at follow-up.   Medications reviewed and include: SSI, Zestril, Ensure   Labs: CBGs 154,196, K 3.3 (L), BUN 38 (H), ALT 61 (H), Hgb 16.1 (H), HCT 47.1 (H)  NUTRITION - FOCUSED PHYSICAL EXAM: Unable to complete at this time, RD working remotely.  Diet Order:   Diet Order            Diet Carb Modified Fluid consistency: Thin; Room service appropriate? Yes  Diet effective now                 EDUCATION NEEDS:   Not appropriate for education at this time  Skin:  Skin Assessment: Reviewed RN Assessment  Last BM:  pta  Height:   Ht Readings from Last 1 Encounters:  10/04/20 5' (1.524 m)    Weight:   Wt Readings from Last 1 Encounters:  10/05/20 40.5 kg    Ideal Body Weight:  45.5 kg  BMI:  Body mass index is 17.44 kg/m.  Estimated Nutritional Needs:   Kcal:  1300-1500  Protein:  53-61  Fluid:  > 1.2 L/day   Lars Masson, RD, LDN Clinical Nutrition After Hours/Weekend Pager # in Amion

## 2020-10-05 NOTE — Consult Note (Addendum)
[facility] Judith Clinger A. Judith Pilgrim, MD     [email or web address]          Judith Sullivan is an 59 y.o. female.   ASSESSMENT/PLAN: 1. MULTIFACTORIAL ENCEPHALOPATHY INCLUDING THE ACUTE STROKES AND MEDICATION EFFECT / OVERDOSE/TOXIC METABOLIC ENCEPHALOPATHY.  Suggest discontinuation of a medications of abuse and refer to appropriate psychological services. EEG 2.  VITAMIN B12 DEFICIENCY: This should be replaced. 3.  BILATERAL SMALL ACUTE INFARCTS IN THE DEEP WATERSHED DISTRIBUTIONS:  Given the series of events as has happened with medication overdose, I think this is most likely due to low flow state/hypovolemia although cardioembolic phenomena cannot be ruled out. The patient should be vigorously worked up given the relatively young age. Typical stroke workup is suggested but also a TEE may be required. A 30 day event monitor is also recommended. Patient should be placed on dual antiplatelet agents for 30 days and then aspirin 325 mg is recommended afterwards.  Control HTN and DM. Add statin.    The patient is a 59 year old left-handed white female who presents with subacute onset of confusion.  She apparently was noted to be normal on Saturday but then became significantly confused and disoriented.  There is concern that the patient may have taken more of her medications for pain and in particular the tizanidine and gabapentin.  She is on opioids additionally although no reports of taking more of this medication.  The husband decided to seek medical attention with the patient about nausea vomiting.  No reports of seizures or convulsions noted.  No focal deficit.  MRI shows evidence of bihemispheric infarcts.  Cognition has improved on hospitalization but she still remains significantly encephalopathic.  Review of systems is limited given the confusion.  She reports no complaints at this time.   GENERAL: Thin under nourished female who is not in acute distress at this time.  She appears  somewhat unkept.  HEENT: Neck is supple; large right frontal blue subacute hemorrhagic area.  ABDOMEN: soft  EXTREMITIES: No edema;    BACK: Normal  SKIN: Normal by inspection.    MENTAL STATUS: She is awake and alert but completely disoriented except for her name.  She is not oriented to time or place.  She tells me that her age is 5 and that she has 3 children.  No dysarthria is noted.  She seems to comprehend well and follows commands briskly.  CRANIAL NERVES: Pupils are equal, round and reactive to light and accomodation; extra ocular movements are full, there is no significant nystagmus; visual fields are full; upper and lower facial muscles are normal in strength and symmetric, there is no flattening of the nasolabial folds; tongue is midline; uvula is midline; shoulder elevation is normal.  MOTOR: Deltoids 4/5; triceps 5 and hip flexion 4/5; no drift of the upper or lower extremities noted.  COORDINATION: Left finger to nose is normal, right finger to nose is normal, No rest tremor; no intention tremor; no postural tremor; no bradykinesia.  SENSATION: Unreliable.          NEURO OUTPT Assessment and Plan:  Left face and arm numbness, constant since July 2019.  Prior work-up has been extensive and included MRI brain, MRI cervical spine, MRA head, and ultrasound carotids which did not show structural pathology to explain symptoms.  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  Her MRI brain and cervical spine was performed at Dr. Earl Gala office and [facility].  I have asked her to request the images and  report so I can personally reviewed these.  Patient was reassured that worrisome pathology such as stroke, aneurysm, multiple sclerosis, or brain tumor have been excluded.  If symptoms progress or do not improve, CSF testing would be the next step.  She does not wish to pursue this at this time.   Blood pressure (!) 195/87, pulse 96,  temperature 99.3 F (37.4 C), temperature source Oral, resp. rate 18, height 5' (1.524 m), weight 40.5 kg, SpO2 95 %.  Past Medical History:  Diagnosis Date  . Asthma with bronchitis   . Backache   . COPD (chronic obstructive pulmonary disease) (HCC)   . CTS (carpal tunnel syndrome)   . Hypertension   . Kidney stones     Past Surgical History:  Procedure Laterality Date  . CESAREAN SECTION    . CHOLECYSTECTOMY    . kidney stone removal    . neck fusion    . SHOULDER SURGERY      Family History  Problem Relation Age of Onset  . Stroke Mother   . Hypertension Mother   . Heart attack Father     Social History:  reports that she has been smoking. She has a 35.00 pack-year smoking history. She has never used smokeless tobacco. She reports current alcohol use. She reports previous drug use.  Allergies: No Known Allergies  Medications: Prior to Admission medications   Medication Sig Start Date End Date Taking? Authorizing Provider  amitriptyline (ELAVIL) 25 MG tablet Take 25 mg by mouth at bedtime. 01/29/19  Yes [provider]  gabapentin (NEURONTIN) 800 MG tablet Take 800 mg by mouth 3 (three) times daily. 01/09/19  Yes [provider]  oxyCODONE (ROXICODONE) 15 MG immediate release tablet Take 15 mg by mouth 4 (four) times daily as needed. 01/09/19  Yes [provider]  valACYclovir (VALTREX) 1000 MG tablet Take 1,000 mg by mouth daily. 09/05/20  Yes [provider]  levofloxacin (LEVAQUIN) 500 MG tablet Take 500 mg by mouth daily. Patient not taking: Reported on 10/04/2020 09/07/20   [provider]  lisinopril (ZESTRIL) 20 MG tablet  11/24/18   [provider]  metFORMIN (GLUCOPHAGE) 500 MG tablet Take by mouth 2 (two) times daily with a meal. Take 2 po qam and 1 po qhs.    [provider]  metoprolol tartrate (LOPRESSOR) 50 MG tablet  11/24/18   [provider]  predniSONE (DELTASONE) 5 MG tablet Take by  mouth. Patient not taking: Reported on 10/04/2020 06/15/20   [provider]  sitaGLIPtin (JANUVIA) 100 MG tablet Take 100 mg by mouth daily.    [provider]  tiZANidine (ZANAFLEX) 4 MG tablet Take 4 mg by mouth every 8 (eight) hours as needed. 01/09/19   [provider]    Scheduled Meds: . aspirin  300 mg Rectal Daily   Or  . aspirin  325 mg Oral Daily  . atorvastatin  20 mg Oral Daily  . feeding supplement  237 mL Oral BID BM  . heparin  5,000 Units Subcutaneous Q8H  . insulin aspart  0-15 Units Subcutaneous TID WC  . insulin aspart  0-5 Units Subcutaneous QHS  . lisinopril  20 mg Oral Daily  . metoprolol tartrate  50 mg Oral BID   Continuous Infusions: PRN Meds:.labetalol, ondansetron **OR** ondansetron (ZOFRAN) IV, polyethylene glycol     Results for orders placed or performed during the hospital encounter of 10/04/20 (from the past 48 hour(s))  Vitamin B12  Status: Abnormal   Collection Time: 10/04/20 10:41 AM  Result Value Ref Range   Vitamin B-12 124 (L) 180 - 914 pg/mL    Comment: (NOTE) This assay is not validated for testing neonatal or myeloproliferative syndrome specimens for Vitamin B12 levels. Performed at [facility], [address], [place], [place] [address]   Urinalysis, Complete w Microscopic     Status: Abnormal   Collection Time: 10/04/20 11:05 AM  Result Value Ref Range   Color, Urine YELLOW YELLOW   APPearance HAZY (A) CLEAR   Specific Gravity, Urine 1.021 1.005 - 1.030   pH 6.0 5.0 - 8.0   Glucose, UA >=500 (A) NEGATIVE mg/dL   Hgb urine dipstick LARGE (A) NEGATIVE   Bilirubin Urine NEGATIVE NEGATIVE   Ketones, ur 80 (A) NEGATIVE mg/dL   Protein, ur 258 (A) NEGATIVE mg/dL   Nitrite NEGATIVE NEGATIVE   Leukocytes,Ua NEGATIVE NEGATIVE   RBC / HPF 0-5 0 - 5 RBC/hpf   WBC, UA 6-10 0 - 5 WBC/hpf   Bacteria, UA RARE (A) NONE SEEN   Squamous Epithelial / LPF 0-5 0 - 5   Mucus PRESENT     Comment: Performed at [facility], [address], [place], [place] [address]  Urine rapid drug screen (hosp performed)     Status: None   Collection Time: 10/04/20 11:06 AM  Result Value Ref Range   Opiates NONE DETECTED NONE DETECTED   Cocaine NONE DETECTED NONE DETECTED   Benzodiazepines NONE DETECTED NONE DETECTED   Amphetamines NONE DETECTED NONE DETECTED   Tetrahydrocannabinol NONE DETECTED NONE DETECTED   Barbiturates NONE DETECTED NONE DETECTED    Comment: (NOTE) DRUG SCREEN FOR MEDICAL PURPOSES ONLY.  IF CONFIRMATION IS NEEDED FOR ANY PURPOSE, NOTIFY LAB WITHIN 5 DAYS.  LOWEST DETECTABLE LIMITS FOR URINE DRUG SCREEN Drug Class                     Cutoff (ng/mL) Amphetamine and metabolites    1000 Barbiturate and metabolites    200 Benzodiazepine                 200 Tricyclics and metabolites     300 Opiates and metabolites        300 Cocaine and metabolites        300 THC                            50 Performed at [facility], [address] [place], [place] [address]   I-Stat Chem 8, ED     Status: Abnormal   Collection Time: 10/04/20 11:13 AM  Result Value Ref Range   Sodium 142 135 - 145 mmol/L   Potassium 3.6 3.5 - 5.1 mmol/L   Chloride 104 98 - 111 mmol/L   BUN 35 (H) 6 - 20 mg/dL   Creatinine, Ser 3.61 0.44 - 1.00 mg/dL   Glucose, Bld 443 (H) 70 - 99 mg/dL    Comment: Glucose reference range applies only to samples taken after fasting for at least 8 hours.   Calcium, Ion 1.17 1.15 - 1.40 mmol/L   TCO2 23 22 - 32 mmol/L   Hemoglobin 16.3 (H) 12.0 - 15.0 g/dL   HCT 15.4 (H) 36 - 46 %  Comprehensive metabolic panel     Status: Abnormal   Collection Time: 10/04/20 11:20 AM  Result Value Ref Range   Sodium 140 135 - 145 mmol/L  Potassium 3.7 3.5 - 5.1 mmol/L   Chloride 100 98 - 111 mmol/L   CO2 23 22 - 32 mmol/L   Glucose, Bld 201 (H) 70 - 99 mg/dL    Comment: Glucose reference range applies only to samples taken after fasting for at least 8 hours.   BUN 37 (H) 6 - 20 mg/dL    Creatinine, Ser 1.610.93 0.44 - 1.00 mg/dL   Calcium 09.610.0 8.9 - 04.510.3 mg/dL   Total Protein 7.7 6.5 - 8.1 g/dL   Albumin 4.4 3.5 - 5.0 g/dL   AST 46 (H) 15 - 41 U/L   ALT 92 (H) 0 - 44 U/L   Alkaline Phosphatase 56 38 - 126 U/L   Total Bilirubin 1.3 (H) 0.3 - 1.2 mg/dL   GFR, Estimated >40>60 >98>60 mL/min    Comment: (NOTE) Calculated using the CKD-EPI Creatinine Equation (2021)    Anion gap 17 (H) 5 - 15    Comment: Performed at [facility], [address] [place], [place] [address]  CBC WITH DIFFERENTIAL     Status: Abnormal   Collection Time: 10/04/20 11:20 AM  Result Value Ref Range   WBC 8.3 4.0 - 10.5 K/uL   RBC 5.25 (H) 3.87 - 5.11 MIL/uL   Hemoglobin 16.9 (H) 12.0 - 15.0 g/dL   HCT 78.248.7 (H) 36 - 46 %   MCV 92.8 80.0 - 100.0 fL   MCH 32.2 26.0 - 34.0 pg   MCHC 34.7 30.0 - 36.0 g/dL   RDW 95.612.4 21.311.5 - 08.615.5 %   Platelets 336 150 - 400 K/uL   nRBC 0.0 0.0 - 0.2 %   Neutrophils Relative % 90 %   Neutro Abs 7.5 1.7 - 7.7 K/uL   Lymphocytes Relative 5 %   Lymphs Abs 0.4 (L) 0.7 - 4.0 K/uL   Monocytes Relative 5 %   Monocytes Absolute 0.4 0.1 - 1.0 K/uL   Eosinophils Relative 0 %   Eosinophils Absolute 0.0 0.0 - 0.5 K/uL   Basophils Relative 0 %   Basophils Absolute 0.0 0.0 - 0.1 K/uL   Immature Granulocytes 0 %   Abs Immature Granulocytes 0.02 0.00 - 0.07 K/uL    Comment: Performed at [facility], [address] [place], [place] [address]  Ammonia     Status: None   Collection Time: 10/04/20 11:20 AM  Result Value Ref Range   Ammonia 9 9 - 35 umol/L    Comment: Performed at [facility], [address] [place], [place] [address]  Lactic acid, plasma     Status: None   Collection Time: 10/04/20 11:20 AM  Result Value Ref Range   Lactic Acid, Venous 1.4 0.5 - 1.9 mmol/L    Comment: Performed at [facility], [address] [place], [place] [address]  Ethanol     Status: None   Collection Time: 10/04/20 11:20 AM  Result Value Ref Range   Alcohol, Ethyl (B) <10 <10 mg/dL     Comment: (NOTE) Lowest detectable limit for serum alcohol is 10 mg/dL.  For medical purposes only. Performed at [facility], [address] [place], [place] [address]   Blood gas, venous     Status: Abnormal   Collection Time: 10/04/20 11:20 AM  Result Value Ref Range   FIO2 21.00    pH, Ven 7.480 (H) 7.25 - 7.43   pCO2, Ven 31.6 (L) 44 - 60 mmHg   pO2, Ven 66.0 (H) 32 - 45 mmHg   Bicarbonate 25.3 20.0 -  28.0 mmol/L   Acid-Base Excess 0.1 0.0 - 2.0 mmol/L   O2 Saturation 92.4 %   Patient temperature 37.0     Comment: Performed at [facility], [address] [place], [place] [address]  CK     Status: Abnormal   Collection Time: 10/04/20 11:20 AM  Result Value Ref Range   Total CK 961 (H) 38.0 - 234.0 U/L    Comment: Performed at [facility], [address] [place], [place] [address]  Acetaminophen level     Status: Abnormal   Collection Time: 10/04/20 11:20 AM  Result Value Ref Range   Acetaminophen (Tylenol), Serum <10 (L) 10 - 30 ug/mL    Comment: (NOTE) Therapeutic concentrations vary significantly. A range of 10-30 ug/mL  may be an effective concentration for many patients. However, some  are best treated at concentrations outside of this range. Acetaminophen concentrations >150 ug/mL at 4 hours after ingestion  and >50 ug/mL at 12 hours after ingestion are often associated with  toxic reactions.  Performed at [facility], [address], [place], [place] [address]   Salicylate level     Status: Abnormal   Collection Time: 10/04/20 11:20 AM  Result Value Ref Range   Salicylate Lvl <7.0 (L) 7.0 - 30.0 mg/dL    Comment: Performed at [facility], [address] [place], [place] [address]  TSH     Status: Abnormal   Collection Time: 10/04/20 11:20 AM  Result Value Ref Range   TSH 0.068 (L) 0.350 - 4.500 uIU/mL    Comment: Performed by a 3rd Generation assay with a functional sensitivity of <=0.01 uIU/mL. Performed at [facility], [address], [place], [place]  [address]   HIV Antibody (routine testing w rflx)     Status: None   Collection Time: 10/04/20 11:20 AM  Result Value Ref Range   HIV Screen 4th Generation wRfx Non Reactive Non Reactive    Comment: Performed at [facility], [address] [place], [place] [address]  Respiratory Panel by RT PCR (Flu A&B, Covid) - Nasopharyngeal Swab     Status: None   Collection Time: 10/04/20 11:46 AM   Specimen: Nasopharyngeal Swab  Result Value Ref Range   SARS Coronavirus 2 by RT PCR NEGATIVE NEGATIVE    Comment: (NOTE) SARS-CoV-2 target nucleic acids are NOT DETECTED.  The SARS-CoV-2 RNA is generally detectable in upper respiratoy specimens during the acute phase of infection. The lowest concentration of SARS-CoV-2 viral copies this assay can detect is 131 copies/mL. A negative result does not preclude SARS-Cov-2 infection and should not be used as the sole basis for treatment or other patient management decisions. A negative result may occur with  improper specimen collection/handling, submission of specimen other than nasopharyngeal swab, presence of viral mutation(s) within the areas targeted by this assay, and inadequate number of viral copies (<131 copies/mL). A negative result must be combined with clinical observations, patient history, and epidemiological information. The expected result is Negative.  Fact Sheet for Patients:  https://www.Ferdinand.com/  Fact Sheet for Healthcare Providers:  https://www.young.biz/  This test is no t yet approved or cleared by the [place] FDA and  has been authorized for detection and/or diagnosis of SARS-CoV-2 by FDA under an Emergency Use Authorization (EUA). This EUA will remain  in effect (meaning this test can be used) for the duration of the COVID-19 declaration under Section 564(b)(1) of the Act, 21 U.S.C. section 360bbb-3(b)(1), unless the authorization is terminated or revoked sooner.  Influenza A by PCR NEGATIVE NEGATIVE   Influenza B by PCR NEGATIVE NEGATIVE    Comment: (NOTE) The Xpert Xpress SARS-CoV-2/FLU/RSV assay is intended as an aid in  the diagnosis of influenza from Nasopharyngeal swab specimens and  should not be used as a sole basis for treatment. Nasal washings and  aspirates are unacceptable for Xpert Xpress SARS-CoV-2/FLU/RSV  testing.  Fact Sheet for Patients: https://www.[facility].com/  Fact Sheet for Healthcare Providers: https://www.young.biz/  This test is not yet approved or cleared by the [place] FDA and  has been authorized for detection and/or diagnosis of SARS-CoV-2 by  FDA under an Emergency Use Authorization (EUA). This EUA will remain  in effect (meaning this test can be used) for the duration of the  Covid-19 declaration under Section 564(b)(1) of the Act, 21  U.S.C. section 360bbb-3(b)(1), unless the authorization is  terminated or revoked. Performed at [facility], [address], [place], [place] [address]   Glucose, capillary     Status: Abnormal   Collection Time: 10/04/20  8:13 PM  Result Value Ref Range   Glucose-Capillary 196 (H) 70 - 99 mg/dL    Comment: Glucose reference range applies only to samples taken after fasting for at least 8 hours.  Comprehensive metabolic panel     Status: Abnormal   Collection Time: 10/05/20  3:20 AM  Result Value Ref Range   Sodium 144 135 - 145 mmol/L   Potassium 3.3 (L) 3.5 - 5.1 mmol/L   Chloride 107 98 - 111 mmol/L   CO2 23 22 - 32 mmol/L   Glucose, Bld 155 (H) 70 - 99 mg/dL    Comment: Glucose reference range applies only to samples taken after fasting for at least 8 hours.   BUN 38 (H) 6 - 20 mg/dL   Creatinine, Ser 1.91 0.44 - 1.00 mg/dL   Calcium 9.6 8.9 - 47.8 mg/dL   Total Protein 6.9 6.5 - 8.1 g/dL   Albumin 4.0 3.5 - 5.0 g/dL   AST 20 15 - 41 U/L   ALT 61 (H) 0 - 44 U/L   Alkaline Phosphatase 49 38 - 126 U/L   Total Bilirubin 1.4  (H) 0.3 - 1.2 mg/dL   GFR, Estimated >29 >56 mL/min    Comment: (NOTE) Calculated using the CKD-EPI Creatinine Equation (2021)    Anion gap 14 5 - 15    Comment: Performed at [facility], [address] [place] [address], [place], [place] [address]  Magnesium     Status: None   Collection Time: 10/05/20  3:20 AM  Result Value Ref Range   Magnesium 2.3 1.7 - 2.4 mg/dL    Comment: Performed at [facility], [address], [place], [place] [address]  CBC WITH DIFFERENTIAL     Status: Abnormal   Collection Time: 10/05/20  3:20 AM  Result Value Ref Range   WBC 8.5 4.0 - 10.5 K/uL   RBC 4.99 3.87 - 5.11 MIL/uL   Hemoglobin 16.1 (H) 12.0 - 15.0 g/dL   HCT 69.6 (H) 36 - 46 %   MCV 94.4 80.0 - 100.0 fL   MCH 32.3 26.0 - 34.0 pg   MCHC 34.2 30.0 - 36.0 g/dL   RDW 29.5 28.4 - 13.2 %   Platelets 327 150 - 400 K/uL   nRBC 0.0 0.0 - 0.2 %   Neutrophils Relative % 81 %   Neutro Abs 6.9 1.7 - 7.7 K/uL   Lymphocytes Relative 11 %   Lymphs Abs 0.9 0.7 - 4.0  K/uL   Monocytes Relative 8 %   Monocytes Absolute 0.7 0.1 - 1.0 K/uL   Eosinophils Relative 0 %   Eosinophils Absolute 0.0 0.0 - 0.5 K/uL   Basophils Relative 0 %   Basophils Absolute 0.0 0.0 - 0.1 K/uL   Immature Granulocytes 0 %   Abs Immature Granulocytes 0.02 0.00 - 0.07 K/uL    Comment: Performed at [facility], [address], [place], [place] [address]  Glucose, capillary     Status: Abnormal   Collection Time: 10/05/20  7:20 AM  Result Value Ref Range   Glucose-Capillary 154 (H) 70 - 99 mg/dL    Comment: Glucose reference range applies only to samples taken after fasting for at least 8 hours.  Glucose, capillary     Status: Abnormal   Collection Time: 10/05/20 11:13 AM  Result Value Ref Range   Glucose-Capillary 153 (H) 70 - 99 mg/dL    Comment: Glucose reference range applies only to samples taken after fasting for at least 8 hours.  Glucose, capillary     Status: Abnormal   Collection Time: 10/05/20  4:23 PM  Result Value Ref Range    Glucose-Capillary 151 (H) 70 - 99 mg/dL    Comment: Glucose reference range applies only to samples taken after fasting for at least 8 hours.    Studies/Results:  BRAIN MRI MRA CONTRAST:  4mL GADAVIST GADOBUTROL 1 MMOL/ML IV SOLN  COMPARISON:  Head CT from yesterday  FINDINGS: MRI HEAD FINDINGS  Brain: Punctate acute to subacute infarcts in the bilateral occipital cortex (see coronal diffusion), bilateral parietal white matter, and right posterior frontal white matter. Chronic lacunes are seen in the left corona radiata and bilateral thalamus. No acute hemorrhage, hydrocephalus, or masslike finding. No abnormal intracranial enhancement.  Vascular: Normal flow voids  Skull and upper cervical spine: Normal marrow signal  Sinuses/Orbits: Negative  MRA HEAD FINDINGS  The vertebral and basilar arteries are smooth and widely patent. Symmetric carotid siphons which are diffusely patent. There is presumably atheromatous (given vascular risk factors) irregularities of bilateral MCA and PCA branches. A left M2 branch appears cut off on the reformats but there are small emanating vessels on source images. The stenoses are moderate to advanced in severity. Negative for aneurysm  IMPRESSION: Brain MRI:  1. 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 cerebral convexities. 2. Remote lacunar infarcts in the left corona radiata and bilateral thalamus. 3. Motion degraded.  Intracranial MRA:  Multifocal medium-sized vessel narrowings, presumably atheromatous. Most notable is a severe proximal left M2 branch stenosis.        The brain MRI and MRA are reviewed in person.  There are a few small deep white matter infarcts involving the left and right frontal parietal lobe essentially in the watershed parasagittal distribution. Remote infarcts are seen in the thalami bilaterally more prominent on the right. There is also left moderate size infarct  extending from the centrum semiovale to the subinsular area. These are nonenhancing. No abnormal enhancement is noted. MRA shows some luminal irregularity but of unclear significance given that it is an MRA.   Arlett Goold A. Judith Sullivan, M.D.  Diplomate, Biomedical engineer of Psychiatry and Neurology ( Neurology). 10/05/2020, 5:19 PM

## 2020-10-05 NOTE — Progress Notes (Deleted)
Follow-up Visit   Date: 10/05/20   JUNEAU DOUGHMAN MRN: [id number] DOB: [birth year]   Interim History: KATREENA SCHUPP is a 59 y.o. ***-handed Caucasian/*** female with peripheral arterial disease, tobacco use, COPD, chronic pain, diabetes, hypertension, and history of cervical fusion returning to the clinic for follow-up of right wrist drop.  The patient was accompanied to the clinic by *** who also provides collateral information.    History of present illness:  UPDATE ***:  Medications:  Current Facility-Administered Medications on File Prior to Visit  Medication Dose Route Frequency Provider Last Rate Last Admin  .  stroke: mapping our early stages of recovery book   Does not apply Once Vassie Loll, MD      . aspirin suppository 300 mg  300 mg Rectal Daily Vassie Loll, MD       Or  . aspirin tablet 325 mg  325 mg Oral Daily Vassie Loll, MD      . feeding supplement (ENSURE ENLIVE / ENSURE PLUS) liquid 237 mL  237 mL Oral BID BM Zierle-Ghosh, Asia B, DO   237 mL at 10/05/20 0837  . heparin injection 5,000 Units  5,000 Units Subcutaneous Q8H Zierle-Ghosh, Asia B, DO   5,000 Units at 10/05/20 0552  . insulin aspart (novoLOG) injection 0-15 Units  0-15 Units Subcutaneous TID WC Zierle-Ghosh, Asia B, DO   3 Units at 10/05/20 0837  . insulin aspart (novoLOG) injection 0-5 Units  0-5 Units Subcutaneous QHS Zierle-Ghosh, Asia B, DO      . labetalol (NORMODYNE) injection 20 mg  20 mg Intravenous Q4H PRN Vassie Loll, MD      . lisinopril (ZESTRIL) tablet 20 mg  20 mg Oral Daily Zierle-Ghosh, Asia B, DO   20 mg at 10/04/20 2104  . metoprolol tartrate (LOPRESSOR) tablet 50 mg  50 mg Oral BID Zierle-Ghosh, Asia B, DO   50 mg at 10/04/20 2105  . ondansetron (ZOFRAN) tablet 4 mg  4 mg Oral Q6H PRN Zierle-Ghosh, Asia B, DO       Or  . ondansetron (ZOFRAN) injection 4 mg  4 mg Intravenous Q6H PRN Zierle-Ghosh, Asia B, DO      . polyethylene glycol (MIRALAX / GLYCOLAX) packet 17 g   17 g Oral Daily PRN Zierle-Ghosh, Asia B, DO       Current Outpatient Medications on File Prior to Visit  Medication Sig Dispense Refill  . amitriptyline (ELAVIL) 25 MG tablet Take 25 mg by mouth at bedtime.    . gabapentin (NEURONTIN) 800 MG tablet Take 800 mg by mouth 3 (three) times daily.    Marland Kitchen levofloxacin (LEVAQUIN) 500 MG tablet Take 500 mg by mouth daily. (Patient not taking: Reported on 10/04/2020)    . lisinopril (ZESTRIL) 20 MG tablet     . metFORMIN (GLUCOPHAGE) 500 MG tablet Take by mouth 2 (two) times daily with a meal. Take 2 po qam and 1 po qhs.    . metoprolol tartrate (LOPRESSOR) 50 MG tablet     . oxyCODONE (ROXICODONE) 15 MG immediate release tablet Take 15 mg by mouth 4 (four) times daily as needed.    . predniSONE (DELTASONE) 5 MG tablet Take by mouth. (Patient not taking: Reported on 10/04/2020)    . sitaGLIPtin (JANUVIA) 100 MG tablet Take 100 mg by mouth daily.    Marland Kitchen tiZANidine (ZANAFLEX) 4 MG tablet Take 4 mg by mouth every 8 (eight) hours as needed.    . valACYclovir (VALTREX) 1000 MG tablet  Take 1,000 mg by mouth daily.      Allergies: No Known Allergies  Vital Signs:  There were no vitals taken for this visit.   General Medical Exam:   General:  Well appearing, comfortable  Eyes/ENT: see cranial nerve examination.   Neck:  No carotid bruits. Respiratory:  Clear to auscultation, good air entry bilaterally.   Cardiac:  Regular rate and rhythm, no murmur.   Ext:  No edema ***  Neurological Exam: MENTAL STATUS including orientation to time, place, person, recent and remote memory, attention span and concentration, language, and fund of knowledge is ***normal.  Speech is not dysarthric.  CRANIAL NERVES:  No visual field defects.  Pupils equal round and reactive to light.  Normal conjugate, extra-ocular eye movements in all directions of gaze.  No ptosis ***.  Face is symmetric. Palate elevates symmetrically.  Tongue is midline.  MOTOR:  Motor strength is 5/5  in all extremities, ***.  No atrophy, fasciculations or abnormal movements.  No pronator drift.  Tone is normal.    MSRs:  Reflexes are 2+/4 throughout ***.  SENSORY:  Intact to vibration throughout ***.  COORDINATION/GAIT:  Normal finger-to- nose-finger.  Intact rapid alternating movements bilaterally.  Gait narrow based and stable.   Data:***  IMPRESSION/PLAN: ***  PLAN/RECOMMENDATIONS:  ***  Return to clinic in ***.   Total time spent:  ***  Thank you for allowing me to participate in patient's care.  If I can answer any additional questions, I would be pleased to do so.    Sincerely,    Kilea Mccarey K. Allena Katz, DO

## 2020-10-06 ENCOUNTER — Inpatient Hospital Stay (HOSPITAL_COMMUNITY): Payer: 59

## 2020-10-06 ENCOUNTER — Ambulatory Visit: Payer: 59 | Admitting: Neurology

## 2020-10-06 ENCOUNTER — Encounter (HOSPITAL_COMMUNITY): Payer: 59

## 2020-10-06 DIAGNOSIS — J9601 Acute respiratory failure with hypoxia: Secondary | ICD-10-CM | POA: Diagnosis not present

## 2020-10-06 DIAGNOSIS — Z515 Encounter for palliative care: Secondary | ICD-10-CM

## 2020-10-06 DIAGNOSIS — E440 Moderate protein-calorie malnutrition: Secondary | ICD-10-CM

## 2020-10-06 DIAGNOSIS — R569 Unspecified convulsions: Secondary | ICD-10-CM

## 2020-10-06 DIAGNOSIS — G9341 Metabolic encephalopathy: Secondary | ICD-10-CM

## 2020-10-06 DIAGNOSIS — R4182 Altered mental status, unspecified: Secondary | ICD-10-CM

## 2020-10-06 DIAGNOSIS — I639 Cerebral infarction, unspecified: Secondary | ICD-10-CM

## 2020-10-06 DIAGNOSIS — Z7189 Other specified counseling: Secondary | ICD-10-CM

## 2020-10-06 DIAGNOSIS — E785 Hyperlipidemia, unspecified: Secondary | ICD-10-CM

## 2020-10-06 LAB — COMPREHENSIVE METABOLIC PANEL
ALT: 43 U/L (ref 0–44)
AST: 15 U/L (ref 15–41)
Albumin: 4.2 g/dL (ref 3.5–5.0)
Alkaline Phosphatase: 47 U/L (ref 38–126)
Anion gap: 17 — ABNORMAL HIGH (ref 5–15)
BUN: 43 mg/dL — ABNORMAL HIGH (ref 6–20)
CO2: 25 mmol/L (ref 22–32)
Calcium: 10 mg/dL (ref 8.9–10.3)
Chloride: 109 mmol/L (ref 98–111)
Creatinine, Ser: 0.88 mg/dL (ref 0.44–1.00)
GFR, Estimated: >60 mL/min (ref >60)
Glucose, Bld: 159 mg/dL — ABNORMAL HIGH (ref 70–99)
Potassium: 3.6 mmol/L (ref 3.5–5.1)
Sodium: 151 mmol/L — ABNORMAL HIGH (ref 135–145)
Total Bilirubin: 1.5 mg/dL — ABNORMAL HIGH (ref 0.3–1.2)
Total Protein: 7 g/dL (ref 6.5–8.1)

## 2020-10-06 LAB — BLOOD GAS, ARTERIAL
Acid-Base Excess: 4.1 mmol/L — ABNORMAL HIGH (ref 0.0–2.0)
Acid-Base Excess: 2.7 mmol/L — ABNORMAL HIGH (ref 0.0–2.0)
Bicarbonate: 28.5 mmol/L — ABNORMAL HIGH (ref 20.0–28.0)
Bicarbonate: 26.5 mmol/L (ref 20.0–28.0)
FIO2: 80
FIO2: 100
O2 Saturation: 92.4 %
O2 Saturation: 99.3 %
Patient temperature: 37
Patient temperature: 36.1
pCO2 arterial: 35.4 mmHg (ref 32.0–48.0)
pCO2 arterial: 40.7 mmHg (ref 32.0–48.0)
pH, Arterial: 7.499 — ABNORMAL HIGH (ref 7.350–7.450)
pH, Arterial: 7.43 (ref 7.350–7.450)
pO2, Arterial: 66.2 mmHg — ABNORMAL LOW (ref 83.0–108.0)
pO2, Arterial: 362 mmHg — ABNORMAL HIGH (ref 83.0–108.0)

## 2020-10-06 LAB — GLUCOSE, CAPILLARY
Glucose-Capillary: 155 mg/dL — ABNORMAL HIGH (ref 70–99)
Glucose-Capillary: 117 mg/dL — ABNORMAL HIGH (ref 70–99)
Glucose-Capillary: 173 mg/dL — ABNORMAL HIGH (ref 70–99)
Glucose-Capillary: 146 mg/dL — ABNORMAL HIGH (ref 70–99)

## 2020-10-06 LAB — CBC
HCT: 52.3 % — ABNORMAL HIGH (ref 36.0–46.0)
Hemoglobin: 17.9 g/dL — ABNORMAL HIGH (ref 12.0–15.0)
MCH: 33 pg (ref 26.0–34.0)
MCHC: 34.2 g/dL (ref 30.0–36.0)
MCV: 96.3 fL (ref 80.0–100.0)
Platelets: 297 10*3/uL (ref 150–400)
RBC: 5.43 MIL/uL — ABNORMAL HIGH (ref 3.87–5.11)
RDW: 12.8 % (ref 11.5–15.5)
WBC: 11.8 10*3/uL — ABNORMAL HIGH (ref 4.0–10.5)
nRBC: 0 % (ref 0.0–0.2)

## 2020-10-06 LAB — HEMOGLOBIN A1C
Hgb A1c MFr Bld: 6.5 % — ABNORMAL HIGH (ref 4.8–5.6)
Mean Plasma Glucose: 140 mg/dL

## 2020-10-06 LAB — LIPID PANEL
Cholesterol: 199 mg/dL (ref 0–200)
HDL: 43 mg/dL (ref >40)
LDL Cholesterol: 111 mg/dL — ABNORMAL HIGH (ref 0–99)
Total CHOL/HDL Ratio: 4.6 RATIO
Triglycerides: 225 mg/dL — ABNORMAL HIGH (ref <150)
VLDL: 45 mg/dL — ABNORMAL HIGH (ref 0–40)

## 2020-10-06 LAB — TRIGLYCERIDES: Triglycerides: 211 mg/dL — ABNORMAL HIGH (ref <150)

## 2020-10-06 LAB — TROPONIN I (HIGH SENSITIVITY)
Troponin I (High Sensitivity): 18 ng/L — ABNORMAL HIGH (ref <18)
Troponin I (High Sensitivity): 20 ng/L — ABNORMAL HIGH (ref <18)

## 2020-10-06 MED ORDER — PROPOFOL 1000 MG/100ML IV EMUL
INTRAVENOUS | Status: AC
  Administered 2020-10-06: 5 ug/kg/min via INTRAVENOUS
  Filled 2020-10-06: qty 100

## 2020-10-06 MED ORDER — METOPROLOL TARTRATE 5 MG/5ML IV SOLN
5.0000 mg | Freq: Three times a day (TID) | INTRAVENOUS | Status: DC
  Administered 2020-10-06: 5 mg via INTRAVENOUS
  Filled 2020-10-06 (×2): qty 5

## 2020-10-06 MED ORDER — SODIUM CHLORIDE 0.9 % IV SOLN
750.0000 mg | Freq: Once | INTRAVENOUS | Status: AC
  Administered 2020-10-06: 750 mg via INTRAVENOUS
  Filled 2020-10-06: qty 7.5

## 2020-10-06 MED ORDER — SODIUM CHLORIDE 0.9 % IV SOLN: INTRAVENOUS | Status: DC

## 2020-10-06 MED ORDER — ALBUTEROL SULFATE (2.5 MG/3ML) 0.083% IN NEBU: 2.5000 mg | INHALATION_SOLUTION | RESPIRATORY_TRACT | Status: DC | PRN

## 2020-10-06 MED ORDER — IPRATROPIUM-ALBUTEROL 0.5-2.5 (3) MG/3ML IN SOLN
3.0000 mL | Freq: Four times a day (QID) | RESPIRATORY_TRACT | Status: DC
  Administered 2020-10-06 – 2020-10-08 (×6): 3 mL via RESPIRATORY_TRACT
  Filled 2020-10-06 (×5): qty 3

## 2020-10-06 MED ORDER — LABETALOL HCL 5 MG/ML IV SOLN: 10.0000 mg | Freq: Once | INTRAVENOUS | Status: DC

## 2020-10-06 MED ORDER — HYDRALAZINE HCL 20 MG/ML IJ SOLN
10.0000 mg | Freq: Three times a day (TID) | INTRAMUSCULAR | Status: DC | PRN
  Administered 2020-10-06: 10 mg via INTRAVENOUS
  Filled 2020-10-06: qty 1

## 2020-10-06 MED ORDER — PANTOPRAZOLE SODIUM 40 MG PO PACK
40.0000 mg | PACK | Freq: Every day | ORAL | Status: DC
  Administered 2020-10-07: 40 mg
  Filled 2020-10-06: qty 20

## 2020-10-06 MED ORDER — INSULIN DETEMIR 100 UNIT/ML ~~LOC~~ SOLN
5.0000 [IU] | Freq: Every day | SUBCUTANEOUS | Status: DC
  Administered 2020-10-06: 5 [IU] via SUBCUTANEOUS
  Filled 2020-10-06 (×3): qty 0.05

## 2020-10-06 MED ORDER — MIDAZOLAM HCL 2 MG/2ML IJ SOLN: 2.0000 mg | INTRAMUSCULAR | Status: DC | PRN

## 2020-10-06 MED ORDER — LORAZEPAM 2 MG/ML IJ SOLN
1.0000 mg | INTRAMUSCULAR | Status: DC | PRN
  Administered 2020-10-06: 1 mg via INTRAVENOUS
  Filled 2020-10-06: qty 1

## 2020-10-06 MED ORDER — ORAL CARE MOUTH RINSE: 15.0000 mL | Freq: Two times a day (BID) | OROMUCOSAL | Status: DC

## 2020-10-06 MED ORDER — MANNITOL 20 % IV SOLN
25.0000 g | Freq: Once | Status: DC
  Filled 2020-10-06: qty 125

## 2020-10-06 MED ORDER — PROPOFOL 1000 MG/100ML IV EMUL
5.0000 ug/kg/min | INTRAVENOUS | Status: DC
  Administered 2020-10-06: 5 ug/kg/min via INTRAVENOUS

## 2020-10-06 MED ORDER — LEVETIRACETAM 500 MG/5ML IV SOLN
INTRAVENOUS | Status: AC
  Filled 2020-10-06: qty 10

## 2020-10-06 MED ORDER — FENTANYL CITRATE (PF) 100 MCG/2ML IJ SOLN
50.0000 ug | INTRAMUSCULAR | Status: DC | PRN
  Administered 2020-10-06: 50 ug via INTRAVENOUS
  Filled 2020-10-06: qty 2

## 2020-10-06 MED ORDER — IOHEXOL 350 MG/ML SOLN
75.0000 mL | Freq: Once | INTRAVENOUS | Status: AC | PRN
  Administered 2020-10-06: 75 mL via INTRAVENOUS

## 2020-10-06 MED ORDER — SODIUM CHLORIDE 0.9 % IV SOLN
3.0000 g | Freq: Three times a day (TID) | INTRAVENOUS | Status: DC
  Administered 2020-10-07: 3 g via INTRAVENOUS
  Filled 2020-10-06: qty 8
  Filled 2020-10-06: qty 3
  Filled 2020-10-06 (×4): qty 8

## 2020-10-06 MED ORDER — POLYETHYLENE GLYCOL 3350 17 G PO PACK: 17.0000 g | PACK | Freq: Every day | ORAL | Status: DC

## 2020-10-06 MED ORDER — CHLORHEXIDINE GLUCONATE CLOTH 2 % EX PADS
6.0000 | MEDICATED_PAD | Freq: Every day | CUTANEOUS | Status: DC
  Administered 2020-10-06: 6 via TOPICAL

## 2020-10-06 MED ORDER — CHLORHEXIDINE GLUCONATE 0.12% ORAL RINSE (MEDLINE KIT)
15.0000 mL | Freq: Two times a day (BID) | OROMUCOSAL | Status: DC
  Administered 2020-10-06 – 2020-10-11 (×9): 15 mL via OROMUCOSAL

## 2020-10-06 MED ORDER — ORAL CARE MOUTH RINSE
15.0000 mL | OROMUCOSAL | Status: DC
  Administered 2020-10-06 – 2020-10-08 (×16): 15 mL via OROMUCOSAL

## 2020-10-06 MED ORDER — CHLORHEXIDINE GLUCONATE 0.12 % MT SOLN
15.0000 mL | Freq: Two times a day (BID) | OROMUCOSAL | Status: DC
  Administered 2020-10-06: 15 mL via OROMUCOSAL
  Filled 2020-10-06: qty 15

## 2020-10-06 MED ORDER — PROPOFOL 1000 MG/100ML IV EMUL
0.0000 ug/kg/min | INTRAVENOUS | Status: DC
  Administered 2020-10-07: 40 ug/kg/min via INTRAVENOUS
  Administered 2020-10-07: 10 ug/kg/min via INTRAVENOUS
  Filled 2020-10-06 (×3): qty 100

## 2020-10-06 MED ORDER — FENTANYL CITRATE (PF) 100 MCG/2ML IJ SOLN: 50.0000 ug | INTRAMUSCULAR | Status: DC | PRN

## 2020-10-06 MED ORDER — SODIUM CHLORIDE 0.9 % IV BOLUS: 500.0000 mL | Freq: Once | INTRAVENOUS | Status: DC

## 2020-10-06 MED ORDER — MIDAZOLAM HCL 2 MG/2ML IJ SOLN
INTRAMUSCULAR | Status: AC
  Administered 2020-10-06: 2 mg
  Filled 2020-10-06: qty 2

## 2020-10-06 MED ORDER — VITAMIN D (ERGOCALCIFEROL) 1.25 MG (50000 UNIT) PO CAPS
50000.0000 [IU] | ORAL_CAPSULE | ORAL | Status: DC
  Filled 2020-10-06: qty 1

## 2020-10-06 MED ORDER — IPRATROPIUM-ALBUTEROL 0.5-2.5 (3) MG/3ML IN SOLN: 3.0000 mL | Freq: Four times a day (QID) | RESPIRATORY_TRACT | Status: DC

## 2020-10-06 MED ORDER — DEXAMETHASONE SODIUM PHOSPHATE 4 MG/ML IJ SOLN
4.0000 mg | Freq: Three times a day (TID) | INTRAMUSCULAR | Status: DC
  Administered 2020-10-06 – 2020-10-11 (×15): 4 mg via INTRAVENOUS
  Filled 2020-10-06 (×15): qty 1

## 2020-10-06 MED ORDER — FENTANYL CITRATE (PF) 100 MCG/2ML IJ SOLN
INTRAMUSCULAR | Status: AC
  Administered 2020-10-06: 100 ug
  Filled 2020-10-06: qty 2

## 2020-10-06 MED ORDER — CYANOCOBALAMIN 1000 MCG/ML IJ SOLN
1000.0000 ug | Freq: Every day | INTRAMUSCULAR | Status: DC
  Administered 2020-10-07: 1000 ug via INTRAMUSCULAR
  Filled 2020-10-06: qty 1

## 2020-10-06 MED ORDER — ENOXAPARIN SODIUM 30 MG/0.3ML ~~LOC~~ SOLN
30.0000 mg | SUBCUTANEOUS | Status: DC
  Administered 2020-10-06 – 2020-10-10 (×5): 30 mg via SUBCUTANEOUS
  Filled 2020-10-06 (×5): qty 0.3

## 2020-10-06 NOTE — Progress Notes (Signed)
EEG complete - results pending 

## 2020-10-06 NOTE — Progress Notes (Signed)
Husband called prior to intubation. Verbalized understanding. Aware of patient going to Neuro ICU at [facility].   Intubation: 1614 10/28 by Dr. Vassie Loll  2 mg versed 100 mg Fentanyl  20 mg etomidate  7.5 ETT 22 lip Positive color change Bilateral breath sounds confirmed by ausculation   Propofol drip ordered for continuous infusion NS @ 50 mL/hr

## 2020-10-06 NOTE — Progress Notes (Signed)
TR[facility] night shift.  The patient was seen due to having a tonic-clonic seizure that lasted 90 to 120 seconds according to the staff.  The patient had trouble opening her eyes.  We had to open them for her.  Her pupils were equal and reactive to light.  She did not answer questions but withdraws extremities with noxious stimuli.  She does not follow commands.  Her vital signs were temperature 98.2 F, pulse 87, respirations 20, BP 201/110 mmHg O2 sat 92% on room air.  Respiratory: Initially tachypneic in the mid 20s, but subsided with supplemental oxygen.  She has mild rhonchi bilaterally. Cardiovascular: S1-S2, RRR Abdomen: Nondistended.  Soft nontender. Extremities: No edema, clubbing or cyanosis.  Imaging: Chest radiograph did not have any acute cardiopulmonary pathology.  Repeat CT shows now a moderate area of edema along the right occipital parietal carpus which has occurred since the brain MRI yesterday, presumably interval infarct.  CTA of head and neck was done, but no acute occlusion was found.  Assessment/plan:  Acute CVA with cerebral fissures edema. The patient received lorazepam 1 mg IVP and was loaded with IV Keppra.  I discussed the case with Dr. Marisue Humble, MD who is on-call for neurology at [facility] and he has agreed to transfer there on our service and they will consult.  Seizure precautions.  Continue neurochecks.  I have notified the patient's husband Margrit Minner) about the patient's change in clinical status and transfer to a higher level of care.  He would like to be notified at the time of transfer.   Sanda Klein, MD  Over 65 minutes of critical care time were spent during the process of this emergent event.  This document was prepared using Dragon voice recognition software and may contain some unintended transcription errors.

## 2020-10-06 NOTE — Progress Notes (Signed)
vLTM started   Neurology notified.  [facility] to monitor.   Event button tested

## 2020-10-06 NOTE — Progress Notes (Signed)
Dr. Tonia Brooms accepting at [facility]

## 2020-10-06 NOTE — Progress Notes (Signed)
Nurse in room to give patient bath. Nurse removes gown and patient's right arm begin to shake, Follow by full body convulsions and patient moaning.  Patient was then rolled to her left side. Episode lasted for one minute 30 seconds. Oxygen in place at 2L/min. MD paged to room.

## 2020-10-06 NOTE — Consult Note (Signed)
NAME:  Judith Sullivan, MRN:  [id number], DOB:  05-31-1961, LOS: 1 ADMISSION DATE:  10/04/2020, CONSULTATION DATE:  10/06/2020  REFERRING MD:  Gwenlyn Perking triad, CHIEF COMPLAINT: Respiratory distress, CVA  Brief History   59 year old woman presenting with confusion and found to have bilateral small acute cerebral infarcts with a watershed distribution, encephalopathic, then developed seizure and respiratory distress requiring BiPAP with suspicion for aspiration  History of present illness   59 year old woman with chronic pain admitted 10/27 with chief complaint of confusion for few days.  Home medications included tizanidine, gabapentin and opiates.  MRI showed evidence of bilateral hemispheric infarcts.  Neurology was consulted and recommended TEE and event monitor with dual antiplatelet therapy.  She was noted to be deficient in vitamin B12. She developed tonic-clonic seizure witnessed by ICU staff 10/28 and transfer to [facility] was plan after consultation with neurology.  Repeat CT showed area of edema along the right occipital parietal region which is new compared to previous MRI.  CT angiogram of the head and neck did not show any acute occlusion.  Swallow evaluation showed high aspiration risk.  Palliative medicine was consulted. She then developed respiratory distress and was placed on BiPAP, PCCM is consulted for possibility of intubation prior to transport  Past Medical History  COPD, smoker Type 2 diabetes Chronic pain  Significant Hospital Events   10/28 tonic-clonic seizure  Consults:  Neurology  Procedures:  ETT 10/28 >>  Significant Diagnostic Tests:  Head CT 10/26 >> negative MRI brain 10/27 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 cerebral convexities.  Remote lacunar infarcts in the left corona radiata and bilateral thalamus.  Head CT 10/28 Moderate area of edema along the right occipital parietal cortex which has occurred since brain MRI  yesterday, presumably interval infarct.   CT angiogram head/neck >> high-grade right PCA branch stenosis   Micro Data:    Antimicrobials:     Interim history/subjective:    Objective   Blood pressure (!) 185/106, pulse 88, temperature (!) 96.4 F (35.8 C), temperature source Axillary, resp. rate (!) 41, height 5\' 3"  (1.6 m), weight 40.6 kg, SpO2 95 %.        Intake/Output Summary (Last 24 hours) at 10/06/2020 1537 Last data filed at 10/06/2020 0500 Gross per 24 hour  Intake 102.95 ml  Output --  Net 102.95 ml   Filed Weights   10/04/20 1036 10/05/20 0500 10/06/20 1000  Weight: 45.4 kg 40.5 kg 40.6 kg    Examination: Gen:      Thin cachectic woman, on bipap, mild distress  HEENT:  EOMI, sclera anicteric, mild pallor ,edentulous Neck:     No JVD; no thyromegaly Lungs:    BL scattered rhonchi, no accesory muscle use CV:         Regular rate and rhythm; no murmurs Abd:      + bowel sounds; soft, non-tender; no palpable masses, no distension Ext:    No edema; adequate peripheral perfusion, RT hand splint Skin:      Warm and dry; no rash Neuro: awake, alert, follows 1 step commands, able to move both UEs off the bed 4/5, BLEs 4/5 , no facial droop  CXR 10/28 no infx or effusion     Resolved Hospital Problem list     Assessment & Plan:  Acute resp failure  - Intubate for airway protection, transport & hypoxia / increased WOB Send resp cx Empiric unasyn started for aspiration  Underlying COPD - duoneb q  6h & albuterol prn  Metabolic encephalopathy - resolved , likely post ictal Propofol / int fent for goal RASS -1 EEG Dexametasone for cerebral vasogenic edema  Acute CVA  - per neurology ASA + plavix Needs TEE & event monitor to r/o embolic causes B12 def being treated  Uncontrolled DM-2 -metformin on hold' detemir 5 U + SSI  Hypertension - anticipate drop p-intubation +propofol Dc metoprolol & use prn   Hypernatremia  - follow Add free  water   Best practice:  Diet: npo Pain/Anxiety/Delirium protocol (if indicated): Propofol/ int fent goal RASS  -1 VAP protocol (if indicated): Y DVT prophylaxis: sq heparin GI prophylaxis: protonix Glucose control: SSI Mobility: Bed rest  Code Status: full Family Communication: husband per [facility] Disposition: ICU ,transfer to [facility] 4N  Labs   CBC: Recent Labs  Lab 10/04/20 1113 10/04/20 1120 10/05/20 0320 10/06/20 0936  WBC  --  8.3 8.5 11.8*  NEUTROABS  --  7.5 6.9  --   HGB 16.3* 16.9* 16.1* 17.9*  HCT 48.0* 48.7* 47.1* 52.3*  MCV  --  92.8 94.4 96.3  PLT  --  336 327 297    Basic Metabolic Panel: Recent Labs  Lab 10/04/20 1113 10/04/20 1120 10/05/20 0320 10/06/20 0936  NA 142 140 144 151*  K 3.6 3.7 3.3* 3.6  CL 104 100 107 109  CO2  --  23 23 25   GLUCOSE 204* 201* 155* 159*  BUN 35* 37* 38* 43*  CREATININE 0.90 0.93 0.81 0.88  CALCIUM  --  10.0 9.6 10.0  MG  --   --  2.3  --    GFR: Estimated Creatinine Clearance: 44.7 mL/min (by C-G formula based on SCr of 0.88 mg/dL). Recent Labs  Lab 10/04/20 1120 10/05/20 0320 10/06/20 0936  WBC 8.3 8.5 11.8*  LATICACIDVEN 1.4  --   --     Liver Function Tests: Recent Labs  Lab 10/04/20 1120 10/05/20 0320 10/06/20 0936  AST 46* 20 15  ALT 92* 61* 43  ALKPHOS 56 49 47  BILITOT 1.3* 1.4* 1.5*  PROT 7.7 6.9 7.0  ALBUMIN 4.4 4.0 4.2   No results for input(s): LIPASE, AMYLASE in the last 168 hours. Recent Labs  Lab 10/04/20 1120  AMMONIA 9    ABG    Component Value Date/Time   PHART 7.499 (H) 10/06/2020 0935   PCO2ART 35.4 10/06/2020 0935   PO2ART 66.2 (L) 10/06/2020 0935   HCO3 28.5 (H) 10/06/2020 0935   TCO2 23 10/04/2020 1113   O2SAT 92.4 10/06/2020 0935     Coagulation Profile: No results for input(s): INR, PROTIME in the last 168 hours.  Cardiac Enzymes: Recent Labs  Lab 10/04/20 1120  CKTOTAL 961*    HbA1C: Hgb A1c MFr Bld  Date/Time Value Ref Range Status  10/04/2020 10:41 AM  6.5 (H) 4.8 - 5.6 % Final    Comment:    (NOTE)         Prediabetes: 5.7 - 6.4         Diabetes: >6.4         Glycemic control for adults with diabetes: <7.0     CBG: Recent Labs  Lab 10/05/20 1113 10/05/20 1623 10/05/20 2045 10/06/20 0859 10/06/20 1143  GLUCAP 153* 151* 159* 155* 117*    Review of Systems:   Unable to obtain since altered and on BiPAP  Past Medical History  She,  has a past medical history of Asthma with bronchitis, Backache, COPD (chronic obstructive pulmonary  disease) (HCC), CTS (carpal tunnel syndrome), Hypertension, and Kidney stones.   Surgical History    Past Surgical History:  Procedure Laterality Date  . CESAREAN SECTION    . CHOLECYSTECTOMY    . kidney stone removal    . neck fusion    . SHOULDER SURGERY       Social History   reports that she has been smoking. She has a 35.00 pack-year smoking history. She has never used smokeless tobacco. She reports current alcohol use. She reports previous drug use.   Family History   Her family history includes Heart attack in her father; Hypertension in her mother; Stroke in her mother.   Allergies No Known Allergies   Home Medications  Prior to Admission medications   Medication Sig Start Date End Date Taking? Authorizing Provider  amitriptyline (ELAVIL) 25 MG tablet Take 25 mg by mouth at bedtime. 01/29/19  Yes [provider]  gabapentin (NEURONTIN) 800 MG tablet Take 800 mg by mouth 3 (three) times daily. 01/09/19  Yes [provider]  oxyCODONE (ROXICODONE) 15 MG immediate release tablet Take 15 mg by mouth 4 (four) times daily as needed. 01/09/19  Yes [provider]  valACYclovir (VALTREX) 1000 MG tablet Take 1,000 mg by mouth daily. 09/05/20  Yes [provider]  levofloxacin (LEVAQUIN) 500 MG tablet Take 500 mg by mouth daily. Patient not taking: Reported on 10/04/2020 09/07/20   [provider]  lisinopril (ZESTRIL) 20 MG tablet  11/24/18    [provider]  metFORMIN (GLUCOPHAGE) 500 MG tablet Take by mouth 2 (two) times daily with a meal. Take 2 po qam and 1 po qhs.    [provider]  metoprolol tartrate (LOPRESSOR) 50 MG tablet  11/24/18   [provider]  predniSONE (DELTASONE) 5 MG tablet Take by mouth. Patient not taking: Reported on 10/04/2020 06/15/20   [provider]  sitaGLIPtin (JANUVIA) 100 MG tablet Take 100 mg by mouth daily.    [provider]  tiZANidine (ZANAFLEX) 4 MG tablet Take 4 mg by mouth every 8 (eight) hours as needed. 01/09/19   [provider]     Critical care time: 91m       Cyril Mourning MD. FCCP. [facility] See Amion for pager  If no response to pager , please call [phone]  After 7:00 pm call Elink  [phone]   10/06/2020

## 2020-10-06 NOTE — Progress Notes (Signed)
Notified pt's husband of tx to [facility].

## 2020-10-06 NOTE — Progress Notes (Signed)
Code stroke  Call Time  NONE Beeper   NONE Exam start  540 am Exam end  545 SOC   545 Epic   547 RAD Called  548

## 2020-10-06 NOTE — Progress Notes (Signed)
   10/06/20 0830  Assess: MEWS Score  Temp 98 F (36.7 C)  BP (!) 187/107  Pulse Rate 97  Resp (!) 40  Level of Consciousness Responds to Voice  Assess: MEWS Score  MEWS Temp 0  MEWS Systolic 0  MEWS Pulse 0  MEWS RR 3  MEWS LOC 1  MEWS Score 4  MEWS Score Color Red  Assess: if the MEWS score is Yellow or Red  Were vital signs taken at a resting state? Yes  Treat  MEWS Interventions Escalated (See documentation below);Consulted Respiratory Therapy;Other (Comment)  Escalate  MEWS: Escalate Red: discuss with charge nurse/RN and provider, consider discussing with RRT  Notify: Charge Nurse/RN  Name of Charge Nurse/RN Notified Karolee Ohs, RN  Date Charge Nurse/RN Notified 10/06/20  Notify: Provider  Provider Name/Title Vassie Loll  Date Provider Notified 10/06/20  Notification Type Call  Notification Reason Change in status  Response See new orders  Date of Provider Response 10/06/20  Document  Patient Outcome Transferred/level of care increased  Progress note created (see row info) Yes   MEWS score red.  RR 40, LOC decreased.  CRN, Director, and MD alerted.  Lab orders placed.  Pt moved to ICU at this time.

## 2020-10-06 NOTE — Progress Notes (Signed)
Patients belongings upon being admitted to 4N20 include the following; upper and lower dentures, pair of shoes, pair of pants, shirt, sweatshirt.

## 2020-10-06 NOTE — Progress Notes (Signed)
SLP Cancellation Note  Patient Details Name: MARISELA LINE MRN: [id number] DOB: 1961/09/01   Cancelled treatment:       Reason Eval/Treat Not Completed: Medical issues which prohibited therapy; Pt had a seizure and was transferred to ICU and is now on Bi-PAP and is inappropriate for po trials. SLP will sign off, please re-consult when appropriate for SLP intervention given that Pt has transferred to a higher level of care. Above to RN.   Repeat head CT from this AM shows: Moderate area of edema along the right occipital parietal cortex which has occurred since brain MRI yesterday, presumably interval infarct. The infarcts are somewhat clustered posteriorly, consider CTA to evaluate the posterior circulation. The dural sinuses were patent on the preceding brain MRI with contrast.  Thank you,  Havery Moros, CCC-SLP [phone]  Timesha Cervantez 10/06/2020, 11:06 AM

## 2020-10-06 NOTE — Consult Note (Signed)
Consultation Note Date: 10/06/2020   Patient Name: Judith Sullivan  DOB: 1961/07/05  MRN: [id number]  Age / Sex: 59 y.o., female  PCP: Glenda Chroman, MD Referring Physician: Barton Dubois, MD  Reason for Consultation: Establishing goals of care  HPI/Patient Profile: 59 y.o. female  with past medical history of COPD, current smoker, DM type 2, hypertension, carpal tunnel syndrome, arthritis, chronic pain admitted on 10/04/2020 with altered mental status and possible gabapentin overdose. Patient with acute metabolic encephalopathy found to have acute/subacute cerebral infarct. Neurology following. On 10/28 early AM, staff witnessed tonic-clonic seizure that lasted 90-120 seconds. Repeat CT revealed moderate area of edema along right occipital parietal carpus which has occurred since previous brain MRI, presumably interval infarct. CTA head/neck without acute occlusion. SLP recommending NPO for now with high aspiration risk. Palliative medicine consultation for goals of care and advanced directives.   Clinical Assessment and Goals of Care:  I have reviewed medical records, discussed with care team and met patient at bedside. She is on BiPAP. She is oriented to name, otherwise drowsy and disoriented this afternoon. Per nursing staff, husband just left [facility] for now.   Spoke with husband, Judith Sullivan via telephone to discuss goals of care.   I introduced Palliative Medicine as specialized medical care for people living with serious illness. It focuses on providing relief from the symptoms and stress of a serious illness. The goal is to improve quality of life for both the patient and the family.  We discussed a brief life review of the patient. They have been married for 31 years. One daughter and patient has another daughter from a previous relationship. Husband reports she is a heavy smoker, 2 packs a day.  Functionally, she gets around well but very poor oral intake. Chronic pain for 7-8 years due to arthritis in her back and neck.   Discussed events leading up to admission and course of hospitalization including diagnoses, interventions, plan of care. Mr. Stobaugh is appreciative of conversation with Dr. Dyann Kief earlier. [facility] care and further neurology workup. Discussed ongoing PT/OT/SLP evaluations pending improvement in cognitive status.   Advanced directives, concepts specific to code status, artifical feeding and hydration were discussed. Patient does not have a documented living will. At this point, husband does wish for her to remain a full code and if her condition worsened "give her that chance to pull through." He does mention that the odds of her surviving aggressive measures would be 'slim.' I did express my concerns with active, frequent smoking and underlying COPD becoming a barrier to successful extubation if she ever ended up on life support. Reassured of ongoing discussions pending further workup. Husband does admit her quality of life has been declining.    Questions and concerns were addressed. Reassured of ongoing support from PMT when transferred to [facility]. PMT contact information given.     SUMMARY OF RECOMMENDATIONS    Continue full code/full scope treatment per husband's wishes."Give her a  chance to pull through" if she ended up on life support.   Patient does not have a documented living will.   Continue PT/OT/SLP efforts.   Pending transfer to [facility] for further neurology workup.   PMT will continue to follow clinical course. Ongoing palliative discussions.   Code Status/Advance Care Planning:  Full code  Symptom Management:   Per attending  Palliative Prophylaxis:   Aspiration, Delirium Protocol, Frequent Pain Assessment, Oral Care and Turn Reposition  Psycho-social/Spiritual:   Desire for further Chaplaincy  support: yes  Additional Recommendations: Caregiving  Support/Resources  Prognosis:   Unable to determine  Discharge Planning: To Be Determined      Primary Diagnoses: Present on Admission: . Metabolic encephalopathy . AMS (altered mental status)   I have reviewed the medical record, interviewed the patient and family, and examined the patient. The following aspects are pertinent.  Past Medical History:  Diagnosis Date  . Asthma with bronchitis   . Backache   . COPD (chronic obstructive pulmonary disease) ([place])   . CTS (carpal tunnel syndrome)   . Hypertension   . Kidney stones    Social History   Socioeconomic History  . Marital status: Married    Spouse name: Not on file  . Number of children: 2  . Years of education: 43  . Highest education level: Not on file  Occupational History  . Occupation: hairdresser  Tobacco Use  . Smoking status: Current Every Day Smoker    Packs/day: 1.00    Years: 35.00    Pack years: 35.00  . Smokeless tobacco: Never Used  Vaping Use  . Vaping Use: Never used  Substance and Sexual Activity  . Alcohol use: Yes    Comment: very rarely  . Drug use: Not Currently  . Sexual activity: Not on file  Other Topics Concern  . Not on file  Social History Narrative   Lives with husband in a one story home.  Has 2 children.  Works as a Theme park manager.  Education: 13 years.        Left handed   Social Determinants of Health   Financial Resource Strain:   . Difficulty of Paying Living Expenses: Not on file  Food Insecurity:   . Worried About Charity fundraiser in the Last Year: Not on file  . Ran Out of Food in the Last Year: Not on file  Transportation Needs:   . Lack of Transportation (Medical): Not on file  . Lack of Transportation (Non-Medical): Not on file  Physical Activity:   . Days of Exercise per Week: Not on file  . Minutes of Exercise per Session: Not on file  Stress:   . Feeling of Stress : Not on file  Social  Connections:   . Frequency of Communication with Friends and Family: Not on file  . Frequency of Social Gatherings with Friends and Family: Not on file  . Attends Religious Services: Not on file  . Active Member of Clubs or Organizations: Not on file  . Attends Archivist Meetings: Not on file  . Marital Status: Not on file   Family History  Problem Relation Age of Onset  . Stroke Mother   . Hypertension Mother   . Heart attack Father    Scheduled Meds: . aspirin  300 mg Rectal Daily   Or  . aspirin  325 mg Oral Daily  . atorvastatin  20 mg Oral Daily  . chlorhexidine  15 mL Mouth Rinse BID  .  Chlorhexidine Gluconate Cloth  6 each Topical Daily  . clopidogrel  75 mg Oral Q breakfast  . cyanocobalamin  1,000 mcg Intramuscular Once  . dexamethasone (DECADRON) injection  4 mg Intravenous Q8H  . feeding supplement  237 mL Oral BID BM  . insulin aspart  0-15 Units Subcutaneous TID WC  . insulin aspart  0-5 Units Subcutaneous QHS  . insulin detemir  5 Units Subcutaneous QHS  . mouth rinse  15 mL Mouth Rinse q12n4p  . metoprolol tartrate  5 mg Intravenous Q8H  . nicotine  21 mg Transdermal Daily   Continuous Infusions: PRN Meds:.albuterol, hydrALAZINE, labetalol, LORazepam, ondansetron **OR** ondansetron (ZOFRAN) IV, polyethylene glycol Medications Prior to Admission:  Prior to Admission medications   Medication Sig Start Date End Date Taking? Authorizing Provider  amitriptyline (ELAVIL) 25 MG tablet Take 25 mg by mouth at bedtime. 01/29/19  Yes [provider]  gabapentin (NEURONTIN) 800 MG tablet Take 800 mg by mouth 3 (three) times daily. 01/09/19  Yes [provider]  oxyCODONE (ROXICODONE) 15 MG immediate release tablet Take 15 mg by mouth 4 (four) times daily as needed. 01/09/19  Yes [provider]  valACYclovir (VALTREX) 1000 MG tablet Take 1,000 mg by mouth daily. 09/05/20  Yes [provider]  levofloxacin (LEVAQUIN) 500 MG tablet  Take 500 mg by mouth daily. Patient not taking: Reported on 10/04/2020 09/07/20   [provider]  lisinopril (ZESTRIL) 20 MG tablet  11/24/18   [provider]  metFORMIN (GLUCOPHAGE) 500 MG tablet Take by mouth 2 (two) times daily with a meal. Take 2 po qam and 1 po qhs.    [provider]  metoprolol tartrate (LOPRESSOR) 50 MG tablet  11/24/18   [provider]  predniSONE (DELTASONE) 5 MG tablet Take by mouth. Patient not taking: Reported on 10/04/2020 06/15/20   [provider]  sitaGLIPtin (JANUVIA) 100 MG tablet Take 100 mg by mouth daily.    [provider]  tiZANidine (ZANAFLEX) 4 MG tablet Take 4 mg by mouth every 8 (eight) hours as needed. 01/09/19   [provider]   No Known Allergies Review of Systems  Unable to perform ROS: Acuity of condition   Physical Exam Vitals and nursing note reviewed.  Constitutional:      Appearance: She is cachectic. She is ill-appearing.  Cardiovascular:     Rate and Rhythm: Normal rate.  Pulmonary:     Effort: No tachypnea, accessory muscle usage or respiratory distress.     Comments: Bipap Skin:    General: Skin is warm and dry.  Neurological:     Mental Status: She is lethargic.     Comments: Drowsy, oriented to name otherwise disoriented    Vital Signs: BP (!) 191/106   Pulse 100   Temp (!) 96.4 F (35.8 C) (Axillary)   Resp (!) 40   Ht 5' 3"  (1.6 m)   Wt 40.6 kg   SpO2 96%   BMI 15.86 kg/m  Pain Scale: CPOT   Pain Score: 0-No pain   SpO2: SpO2: 96 % O2 Device:SpO2: 96 % O2 Flow Rate: .O2 Flow Rate (L/min): 3 L/min  IO: Intake/output summary:   Intake/Output Summary (Last 24 hours) at 10/06/2020 1411 Last data filed at 10/06/2020 0500 Gross per 24 hour  Intake 102.95 ml  Output --  Net 102.95 ml    LBM: Last BM Date: 10/06/20 Baseline Weight: Weight: 45.4 kg Most recent weight: Weight: 40.6 kg  Palliative Assessment/Data: PPS 30%    Time Total:  60 Greater than 50%  of this time was spent counseling and coordinating care related to the above assessment and plan.  Signed by:  Ihor Dow, DNP, FNP-C Palliative Medicine Team  Phone: [phone] Fax: [phone]   Please contact Palliative Medicine Team phone at [phone] for questions and concerns.  For individual provider: See Shea Evans

## 2020-10-06 NOTE — Procedures (Signed)
Patient Name: Judith Sullivan  MRN: [id number]  Epilepsy Attending: Charlsie Quest  Referring Physician/Provider: Dr Sanda Klein Date: 10/06/2020  Duration:   Patient history: 59yo F with acute BL cerebra infarcts and ams. EEG to evaluate for seizure.  Level of alertness:  comatose  AEDs during EEG study: Propofol  Technical aspects: This EEG study was done with scalp electrodes positioned according to the 10-20 International system of electrode placement. Electrical activity was acquired at a sampling rate of 500Hz  and reviewed with a high frequency filter of 70Hz  and a low frequency filter of 1Hz . EEG data were recorded continuously and digitally stored.   Description: EEG showed intermittent generalized 6-9Hz  theta-alpha activity as well as generalized 15-18Hz  beta activiy. Brief periods of generalized eeg attenuation were also noted. Multiple left and right temporal sharp transients were seen. Hyperventilation and photic stimulation were not performed.     ABNORMALITY - Intermittent slow, generalized - Background attenuation, generalized   IMPRESSION: This study is suggestive of profound diffuse encephalopathy, nonspecific etiology but likely related to sedation. No seizures or definite epileptiform discharges were seen throughout the recording. If suspicion for interictal/ictal activity remains a concern, a prolonged study can be considered.   Alekai Pocock 

## 2020-10-06 NOTE — Progress Notes (Signed)
PROGRESS NOTE    Judith Sullivan  NOI:[id number] DOB: [birth year] DOA: 10/04/2020 PCP: Ignatius Specking, MD   Chief Complaint  Patient presents with  . Altered Mental Status    Brief Narrative:  As per H&P written by Dr. Carren Rang on 10/04/20 Judith Sullivan  is a 59 y.o. female, with history of diabetes mellitus type 2, hypertension, COPD, presents to the ER via EMS.  Patient will open eyes to voice, but is unable to provide any history at this time as she is currently nonverbal.  I personally called to speak with husband reports that this all started with her pain medication.  He reports that she had some neck surgery and has some chronic neck pain, and if she runs out of her pain medication she tries to sedate herself.  He reports that she may have taken all of her gabapentin over the weekend.  He was going on a camping trip with her grandson, and he does not know for sure.  He reports that when he returned he found her somnolent.  She had an empty bottle of gabapentin on the ground.  I attempted to call the pharmacy to find out how much gabapentin she had been in that bottle but pharmacy is closed.  If day team would like to follow-up pharmacy is [facility] [place].  Husband reports patient remains somnolent all day Sunday.  On Monday she had vomited on herself, and when he started trying to clean her up she was combative.  He said it was like she did not know she had thrown up on herself.  He put her in the shower got her cleaned up, put her back to bed, and she remained somnolent all through the night Sunday night, all through Monday, and then she was throwing up again today.  He decided that 3 days of somnolence, being nonverbal, not eating or drinking, not taking her medications, all in the setting of diabetes with too much and he decided to call EMS.  Husband reports that when patient threw up it was frothy, with phlegm, and undigested food.  He reports he tried to give her  Coca-Cola, he thinks she was able to get it down, but then immediately came back up.  Of note husband does report that he found her bottle of Zanaflex and there was about 10 pills in the which was correct for when she last got it filled.  In the ED patient initially seemed to be slowly clearing her mental status.  The day time ED provider reported that she was able to say her name to him.  He thought that she would start to wake up and then be able to go home.  Second shift ED provider noted that after several more hours, patient still was drooling/bubbling at the mouth, nonverbal, barely following commands, and just not safe to send home.  Admission was requested for further work-up and management of acute metabolic encephalopathy.  Temperature 98.5, heart rate 101, respiratory rate 31, blood pressure 182/82, satting at 94% on room air VBG shows a pH of 7.48, PO2 of 66, PCO2 of 31.6 CBC shows white blood cell count of 8.3, hemoglobin 16.9 CHEM panel is unremarkable with a glucose of 201 Slightly elevated gap at 17, but bicarb is 23, pH is 7.48 Slightly elevated transaminases with an AST of 46 T bili slightly elevated at 1.3 Lactic acid is within normal limits at 1.4 EKG shows a heart rate of 82, sinus rhythm,  QTC of 461 UA shows glucosuria greater than 500, and 80 ketones -not indicative of UTI patient will obtain x-ray  Assessment & Plan: 1-acute metabolic encephalopathy secondary to acute/subacute cerebral infarct -also playing a role will be hyponatremia and most likely postictal event with subsequent use lorazepam/Keppra initiation. -EEG has been ordered -Per neurology recommendation we will use aspirin and Plavix -She will need TEE and event monitoring to complete work-up -Continue statins -Patient's repeat CT scan demonstrating vasogenic edema around area of infarct and patient has been started on Decadron. -Follow-up by neurology service at [facility].  2-B12 deficiency on  vitamin D deficiency -B12 supplementation and weekly high-dose vitamin D has been started.  3-acute respiratory failure with hypoxia  -With concern for aspiration pneumonia -Chest x-ray not demonstrating acute infiltrate currently concern for pneumonitis most likely -Empirically Unasyn has been started. -Patient with elevated respiratory rate (mid 30s to 40s) borderline hypoxia despite the use of BiPAP. -In order to provide adequate oxygenation and provide airway protection patient has been intubated -Appreciate assistance, consultation and management my critical care service.  4-abnormal TSH -Will check free T4 -no prior history of thyroid disorder. -TSH was low. -maybe sick thyroid  5-type 2 diabetes mellitus -Continue sliding scale insulin -continue holding oral hypoglycemic -A1c 6.5  6-hypertension -Will be permissive in the setting of acute CVA -Continue metoprolol/lisinopril -PRN labetalol and hydralazine ordered  7-HLD -will continue statins  8-hypernatremia -free water added  9-hx of COPD -no wheezing and no SOB. -continue to monitor. -PRN nebulizer to be used.  10-seizure -EEG ordered -continue keppra  11-moderate protein calorie malnutrition -Body mass index is 15.86 kg/m. -will follow rec's by dietitian for feeding supplement   DVT prophylaxis: Heparin Code Status: Full code Family Communication: Husband updated over the phone. 10/06/20 Disposition:   Status is: Inpatient  Dispo: The patient is from: home               Anticipated d/c is to: To be determined.              Anticipated d/c date is: To be determined              Patient currently No medically stable for discharge; overnight with acute seizure episode a repeat CT scan demonstrating vasogenic edema around infarcted area.  Neurology service has recommended patient to be transferred to [facility] for EEG and further evaluation.  Following this event patient has developed acute  respiratory failure with hypoxia with concerns for aspiration pneumonia.  Currently intubated for airway protection and empirically started on Unasyn.     Consultants:   Neurology    Procedures:   See below for x-ray reports   MRI/MRa: old lacunar infarcts and acute/subacute bilateral posterior cerebral convexities.  EEG: pending    Antimicrobials:  None   Subjective: Patient in mild distress; increased respiratory rate tachypnea and currently hypoxic.  Overnight with seizure event and after discussing with critical care service for airway protection she has been intubated and mechanically ventilated.  Objective: Vitals:   10/06/20 0403 10/06/20 0451 10/06/20 0621 10/06/20 0830  BP: (!) 195/112 (!) 156/102 (!) 174/102 (!) 187/107  Pulse: (!) 108 89 96 97  Resp: (!) 30 (!) 22 (!) 23 (!) 40  Temp: (!) 97.5 F (36.4 C) 98.5 F (36.9 C) 98.4 F (36.9 C) 98 F (36.7 C)  TempSrc: Oral Oral Oral Oral  SpO2: 96% 90% 92%   Weight:      Height:  Intake/Output Summary (Last 24 hours) at 10/06/2020 0936 Last data filed at 10/06/2020 0500 Gross per 24 hour  Intake 102.95 ml  Output --  Net 102.95 ml   Filed Weights   10/04/20 1036 10/05/20 0500  Weight: 45.4 kg 40.5 kg    Examination: General exam: Afebrile, intubated and mechanically ventilated. Respiratory system: No wheezing, positive rhonchi bilaterally; positive tachypnea. Cardiovascular system: Rate controlled, no rubs, no gallops, no murmurs, no JVD. Gastrointestinal system: Abdomen is nondistended, soft and nontender. No organomegaly or masses felt. Normal bowel sounds heard. Central nervous system: Unable to properly assess neurologic evaluation due to sedation. Extremities: No cyanosis or clubbing Skin: No petechiae. Psychiatry: Unable to properly assess currently secondary to sedation.   Data Reviewed: I have personally reviewed following labs and imaging studies  CBC: Recent Labs  Lab  10/04/20 1113 10/04/20 1120 10/05/20 0320  WBC  --  8.3 8.5  NEUTROABS  --  7.5 6.9  HGB 16.3* 16.9* 16.1*  HCT 48.0* 48.7* 47.1*  MCV  --  92.8 94.4  PLT  --  336 327    Basic Metabolic Panel: Recent Labs  Lab 10/04/20 1113 10/04/20 1120 10/05/20 0320  NA 142 140 144  K 3.6 3.7 3.3*  CL 104 100 107  CO2  --  23 23  GLUCOSE 204* 201* 155*  BUN 35* 37* 38*  CREATININE 0.90 0.93 0.81  CALCIUM  --  10.0 9.6  MG  --   --  2.3    GFR: Estimated Creatinine Clearance: 48.4 mL/min (by C-G formula based on SCr of 0.81 mg/dL).  Liver Function Tests: Recent Labs  Lab 10/04/20 1120 10/05/20 0320  AST 46* 20  ALT 92* 61*  ALKPHOS 56 49  BILITOT 1.3* 1.4*  PROT 7.7 6.9  ALBUMIN 4.4 4.0    CBG: Recent Labs  Lab 10/05/20 0720 10/05/20 1113 10/05/20 1623 10/05/20 2045 10/06/20 0859  GLUCAP 154* 153* 151* 159* 155*     Recent Results (from the past 240 hour(s))  Respiratory Panel by RT PCR (Flu A&B, Covid) - Nasopharyngeal Swab     Status: None   Collection Time: 10/04/20 11:46 AM   Specimen: Nasopharyngeal Swab  Result Value Ref Range Status   SARS Coronavirus 2 by RT PCR NEGATIVE NEGATIVE Final    Comment: (NOTE) SARS-CoV-2 target nucleic acids are NOT DETECTED.  The SARS-CoV-2 RNA is generally detectable in upper respiratoy specimens during the acute phase of infection. The lowest concentration of SARS-CoV-2 viral copies this assay can detect is 131 copies/mL. A negative result does not preclude SARS-Cov-2 infection and should not be used as the sole basis for treatment or other patient management decisions. A negative result may occur with  improper specimen collection/handling, submission of specimen other than nasopharyngeal swab, presence of viral mutation(s) within the areas targeted by this assay, and inadequate number of viral copies (<131 copies/mL). A negative result must be combined with clinical observations, patient history, and epidemiological  information. The expected result is Negative.  Fact Sheet for Patients:  https://www.[employer].com/  Fact Sheet for Healthcare Providers:  https://www.young.biz/  This test is no t yet approved or cleared by the [place] FDA and  has been authorized for detection and/or diagnosis of SARS-CoV-2 by FDA under an Emergency Use Authorization (EUA). This EUA will remain  in effect (meaning this test can be used) for the duration of the COVID-19 declaration under Section 564(b)(1) of the Act, 21 U.S.C. section 360bbb-3(b)(1), unless the authorization is terminated  or revoked sooner.     Influenza A by PCR NEGATIVE NEGATIVE Final   Influenza B by PCR NEGATIVE NEGATIVE Final    Comment: (NOTE) The Xpert Xpress SARS-CoV-2/FLU/RSV assay is intended as an aid in  the diagnosis of influenza from Nasopharyngeal swab specimens and  should not be used as a sole basis for treatment. Nasal washings and  aspirates are unacceptable for Xpert Xpress SARS-CoV-2/FLU/RSV  testing.  Fact Sheet for Patients: https://www.[employer].com/https://www.fda.gov/media/142436/download  Fact Sheet for Healthcare Providers: https://www.young.biz/https://www.fda.gov/media/142435/download  This test is not yet approved or cleared by the [place] FDA and  has been authorized for detection and/or diagnosis of SARS-CoV-2 by  FDA under an Emergency Use Authorization (EUA). This EUA will remain  in effect (meaning this test can be used) for the duration of the  Covid-19 declaration under Section 564(b)(1) of the Act, 21  U.S.C. section 360bbb-3(b)(1), unless the authorization is  terminated or revoked. Performed at [facility], [address] [place], [place] [address]      Radiology Studies: CT HEAD WO CONTRAST  Result Date: 10/06/2020 CLINICAL DATA:  Seizure with abnormal neuro exam EXAM: CT HEAD WITHOUT CONTRAST TECHNIQUE: Contiguous axial images were obtained from the base of the skull through the vertex without  intravenous contrast. COMPARISON:  Brain MRI from yesterday FINDINGS: Brain: New area of moderate edema in the right occipital parietal cortex. No acute hemorrhage, hydrocephalus, or collection. By MRI there are small acute to subacute infarcts and there are chronic lacunar infarcts at the bilateral thalami and left corona radiata/external capsule. No bilaterality to implicate posterior reversible encephalopathy syndrome. No dural sinus thrombosis on preceding brain MRI. Suggest repeat brain MRI. Vascular: No hyperdense vessel. The dural sinuses were patent on preceding brain MRI. Skull: Negative Sinuses/Orbits: Negative Other: A call has been placed to the ordering provider. IMPRESSION: Moderate area of edema along the right occipital parietal cortex which has occurred since brain MRI yesterday, presumably interval infarct. The infarcts are somewhat clustered posteriorly, consider CTA to evaluate the posterior circulation. The dural sinuses were patent on the preceding brain MRI with contrast. Electronically Signed   By: Marnee SpringJonathon  Watts M.D.   On: 10/06/2020 05:10   CT HEAD WO CONTRAST  Result Date: 10/04/2020 CLINICAL DATA:  Altered mental status. EXAM: CT HEAD WITHOUT CONTRAST TECHNIQUE: Contiguous axial images were obtained from the base of the skull through the vertex without intravenous contrast. COMPARISON:  Brain MRI 10/23/2018. FINDINGS: Brain: No evidence of acute infarction, hemorrhage, hydrocephalus, extra-axial collection or mass lesion/mass effect. Vascular: Atherosclerosis.  No hyperdense vessel. Skull: Intact.  No focal lesion. Sinuses/Orbits: Negative. Other: None. IMPRESSION: No acute abnormality. Atherosclerosis. Electronically Signed   By: Drusilla Kannerhomas  Dalessio M.D.   On: 10/04/2020 11:51   MR ANGIO HEAD WO CONTRAST  Result Date: 10/05/2020 CLINICAL DATA:  Mental status change with unknown cause EXAM: MRI HEAD WITHOUT AND WITH CONTRAST MRA HEAD WITHOUT CONTRAST TECHNIQUE: Multiplanar,  multiecho pulse sequences of the brain and surrounding structures were obtained without and with intravenous contrast. Angiographic images of the head were obtained using MRA technique without contrast. CONTRAST:  4mL GADAVIST GADOBUTROL 1 MMOL/ML IV SOLN COMPARISON:  Head CT from yesterday FINDINGS: MRI HEAD FINDINGS Brain: Punctate acute to subacute infarcts in the bilateral occipital cortex (see coronal diffusion), bilateral parietal white matter, and right posterior frontal white matter. Chronic lacunes are seen in the left corona radiata and bilateral thalamus. No acute hemorrhage, hydrocephalus, or masslike finding. No abnormal intracranial enhancement. Vascular: Normal flow voids  Skull and upper cervical spine: Normal marrow signal Sinuses/Orbits: Negative MRA HEAD FINDINGS The vertebral and basilar arteries are smooth and widely patent. Symmetric carotid siphons which are diffusely patent. There is presumably atheromatous (given vascular risk factors) irregularities of bilateral MCA and PCA branches. A left M2 branch appears cut off on the reformats but there are small emanating vessels on source images. The stenoses are moderate to advanced in severity. Negative for aneurysm IMPRESSION: Brain MRI: 1. 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 cerebral convexities. 2. Remote lacunar infarcts in the left corona radiata and bilateral thalamus. 3. Motion degraded. Intracranial MRA: Multifocal medium-sized vessel narrowings, presumably atheromatous. Most notable is a severe proximal left M2 branch stenosis. Electronically Signed   By: Marnee Spring M.D.   On: 10/05/2020 11:28   MR BRAIN W WO CONTRAST  Result Date: 10/05/2020 CLINICAL DATA:  Mental status change with unknown cause EXAM: MRI HEAD WITHOUT AND WITH CONTRAST MRA HEAD WITHOUT CONTRAST TECHNIQUE: Multiplanar, multiecho pulse sequences of the brain and surrounding structures were obtained without and with intravenous contrast.  Angiographic images of the head were obtained using MRA technique without contrast. CONTRAST:  4mL GADAVIST GADOBUTROL 1 MMOL/ML IV SOLN COMPARISON:  Head CT from yesterday FINDINGS: MRI HEAD FINDINGS Brain: Punctate acute to subacute infarcts in the bilateral occipital cortex (see coronal diffusion), bilateral parietal white matter, and right posterior frontal white matter. Chronic lacunes are seen in the left corona radiata and bilateral thalamus. No acute hemorrhage, hydrocephalus, or masslike finding. No abnormal intracranial enhancement. Vascular: Normal flow voids Skull and upper cervical spine: Normal marrow signal Sinuses/Orbits: Negative MRA HEAD FINDINGS The vertebral and basilar arteries are smooth and widely patent. Symmetric carotid siphons which are diffusely patent. There is presumably atheromatous (given vascular risk factors) irregularities of bilateral MCA and PCA branches. A left M2 branch appears cut off on the reformats but there are small emanating vessels on source images. The stenoses are moderate to advanced in severity. Negative for aneurysm IMPRESSION: Brain MRI: 1. 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 cerebral convexities. 2. Remote lacunar infarcts in the left corona radiata and bilateral thalamus. 3. Motion degraded. Intracranial MRA: Multifocal medium-sized vessel narrowings, presumably atheromatous. Most notable is a severe proximal left M2 branch stenosis. Electronically Signed   By: Marnee Spring M.D.   On: 10/05/2020 11:28   US Carotid Bilateral (at [facility] and [facility] only)  Result Date: 10/05/2020 CLINICAL DATA:  Hypertension, stroke symptoms, diabetes EXAM: BILATERAL CAROTID DUPLEX ULTRASOUND TECHNIQUE: Wallace Cullens scale imaging, color Doppler and duplex ultrasound were performed of bilateral carotid and vertebral arteries in the neck. COMPARISON:  None. FINDINGS: Criteria: Quantification of carotid stenosis is based on velocity parameters that correlate the  residual internal carotid diameter with NASCET-based stenosis levels, using the diameter of the distal internal carotid lumen as the denominator for stenosis measurement. The following velocity measurements were obtained: RIGHT ICA: 64/14 cm/sec CCA: 64/92 cm/sec SYSTOLIC ICA/CCA RATIO:  0.7 ECA: 378 cm/sec LEFT Unable to perform the left carotid portion of the exam because the patient is not cooperative and unable to extend in the left neck for imaging. RIGHT CAROTID ARTERY: Minor echogenic shadowing plaque formation. No hemodynamically significant right ICA stenosis, velocity elevation, or turbulent flow. Degree of narrowing less than 50%. RIGHT VERTEBRAL ARTERY:  Normal antegrade flow LEFT CAROTID ARTERY:  Not performed LEFT VERTEBRAL ARTERY:  Not performed IMPRESSION: Bilateral carotid atherosclerosis. No hemodynamically significant right ICA stenosis. Degree of narrowing less than  50% by ultrasound criteria. Normal antegrade right vertebral artery. Unable to perform the left carotid and vertebral artery portions of the exam. Electronically Signed   By: Judie Petit.  Shick M.D.   On: 10/05/2020 15:07   DG CHEST PORT 1 VIEW  Result Date: 10/06/2020 CLINICAL DATA:  Seizure.  Possible aspiration. EXAM: PORTABLE CHEST 1 VIEW COMPARISON:  10/04/2020. FINDINGS: 0426 hours. Lungs are hyperexpanded. The lungs are clear without focal pneumonia, edema, pneumothorax or pleural effusion. The cardiopericardial silhouette is within normal limits for size. The visualized bony structures of the thorax show no acute abnormality. Telemetry leads overlie the chest. IMPRESSION: No active disease. Electronically Signed   By: Kennith Center M.D.   On: 10/06/2020 05:04   DG CHEST PORT 1 VIEW  Result Date: 10/04/2020 CLINICAL DATA:  Metabolic encephalopathy.  Smoker. EXAM: PORTABLE CHEST 1 VIEW COMPARISON:  Report 03/12/2018 FINDINGS: Numerous leads and wires project over the chest. Cervical spine fixation. Apical lordotic positioning.  Midline trachea. Normal heart size. No pleural effusion or pneumothorax. Moderate hyperinflation. Clear lungs. IMPRESSION: Hyperinflation, without acute disease. Electronically Signed   By: Jeronimo Greaves M.D.   On: 10/04/2020 19:43   ECHOCARDIOGRAM COMPLETE  Result Date: 10/05/2020    ECHOCARDIOGRAM REPORT   Patient Name:   Judith Sullivan Date of Exam: 10/05/2020 Medical Rec #:  [id number]       Height:       60.0 in Accession #:    [id number]      Weight:       89.3 lb Date of Birth:  Mar 15, 1961        BSA:          1.325 m Patient Age:    59 years        BP:           172/86 mmHg Patient Gender: F               HR:           85 bpm. [facility] Procedure: 2D Echo, Cardiac Doppler and Color Doppler Indications:    Stroke 434.91 / I163.9  History:        Patient has no prior history of Echocardiogram examinations.                 COPD; Risk Factors:Hypertension. AMS (altered mental status).  Sonographer:    Celesta Gentile RCS Referring Phys: Vassie Loll  Sonographer Comments: Technically difficult study due to poor echo windows. IMPRESSIONS  1. Left ventricular ejection fraction, by estimation, is 60 to 65%. The left ventricle has normal function. The left ventricle has no regional wall motion abnormalities. There is mild left ventricular hypertrophy. Left ventricular diastolic parameters are consistent with Grade I diastolic dysfunction (impaired relaxation).  2. Right ventricular systolic function is normal. The right ventricular size is normal.  3. The mitral valve is normal in structure. No evidence of mitral valve regurgitation. No evidence of mitral stenosis.  4. The aortic valve has an indeterminant number of cusps. Aortic valve regurgitation is not visualized. No aortic stenosis is present.  5. The inferior vena cava is normal in size with greater than 50% respiratory variability, suggesting right atrial pressure of 3 mmHg. FINDINGS  Left Ventricle: Left ventricular ejection fraction, by  estimation, is 60 to 65%. The left ventricle has normal function. The left ventricle has no regional wall motion abnormalities. The left ventricular internal cavity size was normal in size. There is  mild  left ventricular hypertrophy. Left ventricular diastolic parameters are consistent with Grade I diastolic dysfunction (impaired relaxation). Normal left ventricular filling pressure. Right Ventricle: The right ventricular size is normal. No increase in right ventricular wall thickness. Right ventricular systolic function is normal. Left Atrium: Left atrial size was normal in size. Right Atrium: Right atrial size was normal in size. Pericardium: There is no evidence of pericardial effusion. Mitral Valve: The mitral valve is normal in structure. No evidence of mitral valve regurgitation. No evidence of mitral valve stenosis. Tricuspid Valve: The tricuspid valve is normal in structure. Tricuspid valve regurgitation is not demonstrated. No evidence of tricuspid stenosis. Aortic Valve: The aortic valve has an indeterminant number of cusps. Aortic valve regurgitation is not visualized. No aortic stenosis is present. Aortic valve mean gradient measures 4.5 mmHg. Aortic valve peak gradient measures 9.2 mmHg. Aortic valve area, by VTI measures 1.92 cm. Pulmonic Valve: The pulmonic valve was not well visualized. Pulmonic valve regurgitation is not visualized. No evidence of pulmonic stenosis. Aorta: The aortic root is normal in size and structure. Pulmonary Artery: Indeterminant PASP, inadequate TR jet. Venous: The inferior vena cava is normal in size with greater than 50% respiratory variability, suggesting right atrial pressure of 3 mmHg. IAS/Shunts: No atrial level shunt detected by color flow Doppler.  LEFT VENTRICLE PLAX 2D LVIDd:         2.25 cm  Diastology LVIDs:         1.62 cm  LV e' medial:    6.96 cm/s LV PW:         1.13 cm  LV E/e' medial:  10.6 LV IVS:        1.12 cm  LV e' lateral:   6.20 cm/s LVOT diam:      1.70 cm  LV E/e' lateral: 11.9 LV SV:         49 LV SV Index:   37 LVOT Area:     2.27 cm  RIGHT VENTRICLE RV S prime:     13.70 cm/s TAPSE (M-mode): 1.7 cm LEFT ATRIUM             Index       RIGHT ATRIUM          Index LA diam:        2.70 cm 2.04 cm/m  RA Area:     5.33 cm LA Vol (A2C):   22.8 ml 17.21 ml/m RA Volume:   6.79 ml  5.12 ml/m LA Vol (A4C):   19.4 ml 14.64 ml/m LA Biplane Vol: 22.3 ml 16.83 ml/m  AORTIC VALVE AV Area (Vmax):    1.63 cm AV Area (Vmean):   1.64 cm AV Area (VTI):     1.92 cm AV Vmax:           152.01 cm/s AV Vmean:          99.790 cm/s AV VTI:            0.256 m AV Peak Grad:      9.2 mmHg AV Mean Grad:      4.5 mmHg LVOT Vmax:         109.00 cm/s LVOT Vmean:        71.900 cm/s LVOT VTI:          0.217 m LVOT/AV VTI ratio: 0.85  AORTA Ao Root diam: 2.60 cm MITRAL VALVE MV Area (PHT): 2.58 cm     SHUNTS MV Decel Time: 294 msec     Systemic VTI:  0.22 m MV E velocity: 73.70 cm/s   Systemic Diam: 1.70 cm MV A velocity: 119.00 cm/s MV E/A ratio:  0.62 Dina Rich MD Electronically signed by Dina Rich MD Signature Date/Time: 10/05/2020/4:14:53 PM    Final    CT ANGIO HEAD CODE STROKE  Result Date: 10/06/2020 CLINICAL DATA:  Seizure.  Abnormal head CT EXAM: CT ANGIOGRAPHY HEAD AND NECK TECHNIQUE: Multidetector CT imaging of the head and neck was performed using the standard protocol during bolus administration of intravenous contrast. Multiplanar CT image reconstructions and MIPs were obtained to evaluate the vascular anatomy. Carotid stenosis measurements (when applicable) are obtained utilizing NASCET criteria, using the distal internal carotid diameter as the denominator. CONTRAST:  83mL OMNIPAQUE IOHEXOL 350 MG/ML SOLN COMPARISON:  MRI and MRA from yesterday. FINDINGS: CTA NECK FINDINGS Aortic arch: 3 vessel branching. Atheromatous plaque. No acute finding or dilatation. Right carotid system: Mixed density plaque at the bifurcation without stenosis or ulceration.  Left carotid system: Mixed density plaque mainly at the bifurcation without stenosis or ulceration. Vertebral arteries: Bilateral proximal subclavian atherosclerosis without significant stenosis. The vertebral arteries are smooth and widely patent to the dura. Skeleton: C6-7 ACDF. Other neck: No incidental inflammation or mass seen. Upper chest: No acute finding Review of the MIP images confirms the above findings CTA HEAD FINDINGS Anterior circulation: Atheromatous plaque along the carotid siphons with up to mild stenosis. No branch occlusion, beading, or aneurysm seen. A left M2 branch stenosis on prior MRA was primarily related to branching and artifact. MCA atheromatous changes are mild. Posterior circulation: Mild left vertebral artery dominance. The vertebral and basilar arteries are smooth and widely patent. Mild to moderate atheromatous irregularity of bilateral proximal posterior cerebral arteries. High-grade narrowing is seen at the upper branch of the right PCA. Negative for aneurysm. Venous sinuses: Diffusely patent Anatomic variants: None significant Review of the MIP images confirms the above findings IMPRESSION: 1. No emergent vascular finding.  No evident embolic source. 2. Cervical and intracranial atherosclerosis. Intracranial narrowings were overestimated on preceding MRA, but there is a high-grade right PCA branch stenosis. 3. No flow reducing stenosis or ulceration in the neck. Electronically Signed   By: Marnee Spring M.D.   On: 10/06/2020 06:08   CT ANGIO NECK CODE STROKE  Result Date: 10/06/2020 CLINICAL DATA:  Seizure.  Abnormal head CT EXAM: CT ANGIOGRAPHY HEAD AND NECK TECHNIQUE: Multidetector CT imaging of the head and neck was performed using the standard protocol during bolus administration of intravenous contrast. Multiplanar CT image reconstructions and MIPs were obtained to evaluate the vascular anatomy. Carotid stenosis measurements (when applicable) are obtained utilizing  NASCET criteria, using the distal internal carotid diameter as the denominator. CONTRAST:  8mL OMNIPAQUE IOHEXOL 350 MG/ML SOLN COMPARISON:  MRI and MRA from yesterday. FINDINGS: CTA NECK FINDINGS Aortic arch: 3 vessel branching. Atheromatous plaque. No acute finding or dilatation. Right carotid system: Mixed density plaque at the bifurcation without stenosis or ulceration. Left carotid system: Mixed density plaque mainly at the bifurcation without stenosis or ulceration. Vertebral arteries: Bilateral proximal subclavian atherosclerosis without significant stenosis. The vertebral arteries are smooth and widely patent to the dura. Skeleton: C6-7 ACDF. Other neck: No incidental inflammation or mass seen. Upper chest: No acute finding Review of the MIP images confirms the above findings CTA HEAD FINDINGS Anterior circulation: Atheromatous plaque along the carotid siphons with up to mild stenosis. No branch occlusion, beading, or aneurysm seen. A left M2 branch stenosis on prior MRA was primarily related to branching  and artifact. MCA atheromatous changes are mild. Posterior circulation: Mild left vertebral artery dominance. The vertebral and basilar arteries are smooth and widely patent. Mild to moderate atheromatous irregularity of bilateral proximal posterior cerebral arteries. High-grade narrowing is seen at the upper branch of the right PCA. Negative for aneurysm. Venous sinuses: Diffusely patent Anatomic variants: None significant Review of the MIP images confirms the above findings IMPRESSION: 1. No emergent vascular finding.  No evident embolic source. 2. Cervical and intracranial atherosclerosis. Intracranial narrowings were overestimated on preceding MRA, but there is a high-grade right PCA branch stenosis. 3. No flow reducing stenosis or ulceration in the neck. Electronically Signed   By: Marnee Spring M.D.   On: 10/06/2020 06:08    Scheduled Meds: . aspirin  300 mg Rectal Daily   Or  . aspirin  325  mg Oral Daily  . atorvastatin  20 mg Oral Daily  . clopidogrel  75 mg Oral Q breakfast  . cyanocobalamin  1,000 mcg Intramuscular Once  . dexamethasone (DECADRON) injection  4 mg Intravenous Q8H  . feeding supplement  237 mL Oral BID BM  . insulin aspart  0-15 Units Subcutaneous TID WC  . insulin aspart  0-5 Units Subcutaneous QHS  . insulin detemir  5 Units Subcutaneous QHS  . lisinopril  20 mg Oral Daily  . metoprolol tartrate  50 mg Oral BID  . nicotine  21 mg Transdermal Daily   Continuous Infusions:   LOS: 1 day    Time spent: 35 minutes.   Vassie Loll, MD [facility]   To contact the attending provider between 7A-7P or the covering provider during after hours 7P-7A, please log into the web site [email or web address] and access using universal [place] password for that web site. If you do not have the password, please call the hospital operator.  10/06/2020, 9:36 AM

## 2020-10-06 NOTE — Procedures (Signed)
Intubation Procedure Note  Judith Sullivan  [id number]  [birth year]  Date:10/06/20  Time:4:26 PM   Provider Performing:Tallon Gertz V. Colsen Modi    Procedure: Intubation (31500)  Indication(s) Respiratory Failure  Consent Risks of the procedure as well as the alternatives and risks of each were explained to the patient and/or caregiver.  Consent for the procedure was obtained and is signed in the bedside chart   Anesthesia Etomidate, Versed and Fentanyl   Time Out Verified patient identification, verified procedure, site/side was marked, verified correct patient position, special equipment/implants available, medications/allergies/relevant history reviewed, required imaging and test results available.   Sterile Technique Usual hand hygeine, masks, and gloves were used   Procedure Description Patient positioned in bed supine.  Sedation given as noted above.  Patient was intubated with endotracheal tube using Glidescope.  View was Grade 1 full glottis .  Number of attempts was 1.  Colorimetric CO2 detector was consistent with tracheal placement.   Complications/Tolerance None; patient tolerated the procedure well. Chest X-ray is ordered to verify placement.   EBL Minimal   Specimen(s) None   Judith Sullivan V. Vassie Loll MD

## 2020-10-07 ENCOUNTER — Encounter (HOSPITAL_COMMUNITY): Payer: Self-pay | Admitting: Pulmonary Disease

## 2020-10-07 ENCOUNTER — Inpatient Hospital Stay (HOSPITAL_COMMUNITY): Payer: 59

## 2020-10-07 DIAGNOSIS — G929 Unspecified toxic encephalopathy: Secondary | ICD-10-CM

## 2020-10-07 DIAGNOSIS — T6591XA Toxic effect of unspecified substance, accidental (unintentional), initial encounter: Secondary | ICD-10-CM

## 2020-10-07 DIAGNOSIS — I639 Cerebral infarction, unspecified: Secondary | ICD-10-CM

## 2020-10-07 DIAGNOSIS — G9340 Encephalopathy, unspecified: Secondary | ICD-10-CM

## 2020-10-07 DIAGNOSIS — R569 Unspecified convulsions: Secondary | ICD-10-CM | POA: Diagnosis not present

## 2020-10-07 DIAGNOSIS — E782 Mixed hyperlipidemia: Secondary | ICD-10-CM

## 2020-10-07 DIAGNOSIS — I6300 Cerebral infarction due to thrombosis of unspecified precerebral artery: Secondary | ICD-10-CM

## 2020-10-07 DIAGNOSIS — I6930 Unspecified sequelae of cerebral infarction: Secondary | ICD-10-CM

## 2020-10-07 DIAGNOSIS — Z978 Presence of other specified devices: Secondary | ICD-10-CM

## 2020-10-07 LAB — GLUCOSE, CAPILLARY
Glucose-Capillary: 153 mg/dL — ABNORMAL HIGH (ref 70–99)
Glucose-Capillary: 157 mg/dL — ABNORMAL HIGH (ref 70–99)
Glucose-Capillary: 159 mg/dL — ABNORMAL HIGH (ref 70–99)
Glucose-Capillary: 163 mg/dL — ABNORMAL HIGH (ref 70–99)
Glucose-Capillary: 172 mg/dL — ABNORMAL HIGH (ref 70–99)

## 2020-10-07 LAB — PHOSPHORUS
Phosphorus: 2.2 mg/dL — ABNORMAL LOW (ref 2.5–4.6)
Phosphorus: 2 mg/dL — ABNORMAL LOW (ref 2.5–4.6)

## 2020-10-07 LAB — MAGNESIUM
Magnesium: 2.3 mg/dL (ref 1.7–2.4)
Magnesium: 2.2 mg/dL (ref 1.7–2.4)

## 2020-10-07 LAB — MRSA PCR SCREENING: MRSA by PCR: POSITIVE — AB

## 2020-10-07 MED ORDER — CYANOCOBALAMIN 1000 MCG/ML IJ SOLN
1000.0000 ug | Freq: Every day | INTRAMUSCULAR | Status: DC
  Administered 2020-10-08 – 2020-10-12 (×5): 1000 ug via INTRAMUSCULAR
  Filled 2020-10-07 (×5): qty 1

## 2020-10-07 MED ORDER — INSULIN ASPART 100 UNIT/ML ~~LOC~~ SOLN
0.0000 [IU] | SUBCUTANEOUS | Status: DC
  Administered 2020-10-07 – 2020-10-08 (×4): 3 [IU] via SUBCUTANEOUS
  Administered 2020-10-08: 2 [IU] via SUBCUTANEOUS
  Administered 2020-10-08 (×3): 3 [IU] via SUBCUTANEOUS
  Administered 2020-10-09 (×2): 2 [IU] via SUBCUTANEOUS

## 2020-10-07 MED ORDER — MUPIROCIN 2 % EX OINT
1.0000 "application " | TOPICAL_OINTMENT | Freq: Two times a day (BID) | CUTANEOUS | Status: AC
  Administered 2020-10-07 – 2020-10-11 (×10): 1 via NASAL
  Filled 2020-10-07 (×2): qty 22

## 2020-10-07 MED ORDER — VITAMIN D 25 MCG (1000 UNIT) PO TABS
1000.0000 [IU] | ORAL_TABLET | Freq: Every day | ORAL | Status: DC
  Administered 2020-10-07: 1000 [IU]
  Filled 2020-10-07 (×2): qty 1

## 2020-10-07 MED ORDER — INSULIN DETEMIR 100 UNIT/ML ~~LOC~~ SOLN
5.0000 [IU] | Freq: Every day | SUBCUTANEOUS | Status: DC
  Administered 2020-10-07 – 2020-10-10 (×3): 5 [IU] via SUBCUTANEOUS
  Filled 2020-10-07 (×6): qty 0.05

## 2020-10-07 MED ORDER — SODIUM CHLORIDE 0.9 % IV SOLN
3.0000 g | Freq: Four times a day (QID) | INTRAVENOUS | Status: DC
  Administered 2020-10-07 – 2020-10-12 (×22): 3 g via INTRAVENOUS
  Filled 2020-10-07: qty 8
  Filled 2020-10-07: qty 3
  Filled 2020-10-07 (×5): qty 8
  Filled 2020-10-07 (×2): qty 3
  Filled 2020-10-07 (×9): qty 8
  Filled 2020-10-07 (×2): qty 3
  Filled 2020-10-07 (×4): qty 8
  Filled 2020-10-07: qty 3
  Filled 2020-10-07: qty 8

## 2020-10-07 MED ORDER — FREE WATER
200.0000 mL | Status: DC
  Administered 2020-10-07 – 2020-10-08 (×6): 200 mL

## 2020-10-07 MED ORDER — POLYETHYLENE GLYCOL 3350 17 G PO PACK
17.0000 g | PACK | Freq: Every day | ORAL | Status: DC
  Administered 2020-10-07: 17 g
  Filled 2020-10-07: qty 1

## 2020-10-07 MED ORDER — METOPROLOL TARTRATE 25 MG PO TABS
12.5000 mg | ORAL_TABLET | Freq: Two times a day (BID) | ORAL | Status: DC
  Administered 2020-10-07 (×2): 12.5 mg
  Filled 2020-10-07 (×3): qty 1

## 2020-10-07 MED ORDER — VITAL AF 1.2 CAL PO LIQD
1000.0000 mL | ORAL | Status: DC
  Administered 2020-10-07: 1000 mL

## 2020-10-07 MED ORDER — ATORVASTATIN CALCIUM 10 MG PO TABS
20.0000 mg | ORAL_TABLET | Freq: Every day | ORAL | Status: DC
  Administered 2020-10-07: 20 mg
  Filled 2020-10-07: qty 2

## 2020-10-07 MED ORDER — CLOPIDOGREL BISULFATE 75 MG PO TABS
75.0000 mg | ORAL_TABLET | Freq: Every day | ORAL | Status: DC
  Administered 2020-10-07 – 2020-10-08 (×2): 75 mg
  Filled 2020-10-07 (×2): qty 1

## 2020-10-07 MED ORDER — CHLORHEXIDINE GLUCONATE CLOTH 2 % EX PADS
6.0000 | MEDICATED_PAD | Freq: Every day | CUTANEOUS | Status: AC
  Administered 2020-10-07 – 2020-10-11 (×4): 6 via TOPICAL

## 2020-10-07 NOTE — Progress Notes (Signed)
EEG maintenance complete. There is skin breakdown at electrode site FP2 on the forehead. Moved and adjusted electrodes for prevent further skin breakdown.

## 2020-10-07 NOTE — Progress Notes (Signed)
Nutrition Follow-up  DOCUMENTATION CODES:   Underweight  INTERVENTION:   Tube Feeding via OG:  Vital AF 1.2 at 40 ml/hr Provides 1152 kcals, 72 g of protein and 778 mL of free water  TF regimen and propofol at current rate providing 1596 total kcal/day   Monitor magnesium, potassium, and phosphorus for at least 4 occurrences and MD to replete as needed, as pt is at risk for refeeding syndrome given underweight status, poor po intake, possible malnutrition and possible weight loss   NUTRITION DIAGNOSIS:   Inadequate oral intake related to lethargy/confusion as evidenced by per patient/family report (as per HPI).  Being addressed via TF   GOAL:   Patient will meet greater than or equal to 90% of their needs  Progressing  MONITOR:   Labs, I & O's, Supplement acceptance, PO intake, Weight trends  REASON FOR ASSESSMENT:   Malnutrition Screening Tool    ASSESSMENT:   59 year old female with history of DM2, HTN, COPD, asthma, and chronic neck pain presented with 3 day history of altered mental status possibly due to gabapentin overdose.  10/26 Admitted to [facility] 10/28 Intubated, tonic clonic seizure, transferred to [facility]  Pt now with acute ischemic stroke, high grade right PCA branch stenosis  Patient is currently intubated on ventilator support MV: 10.9 L/min Temp (24hrs), Avg:98.3 F (36.8 C), Min:96.3 F (35.7 C), Max:100.4 F (38 C)  Propofol: 16.8 ml/hr  Per chest xray, OG courses down esophagus and into stomach  Free water per tube initiated due to hypernatremia  Receiving B-12 injections for B-12 deficiency  Pt is underweight, with no po intake for several days PTA.  Current wt 45.4 kg; noted weight of 40.5 kg on 10/27. Weight encounter from 05/2019 of 52.6 kg indicating possible weight loss. No other weight encounters available  Suspect possibility of malnutrition; plan to perform nutrition focused physical exam on follow-up to better  assess  Labs: sodium 151 (H), Creatinine wdl Meds: decadron, ss novolog, levemir, miralax  Diet Order:   Diet Order            Diet NPO time specified  Diet effective now                 EDUCATION NEEDS:   Not appropriate for education at this time  Skin:  Skin Assessment: Reviewed RN Assessment  Last BM:  10/28  Height:   Ht Readings from Last 1 Encounters:  10/06/20 5\' 3"  (1.6 m)    Weight:   Wt Readings from Last 1 Encounters:  10/07/20 45.4 kg    Ideal Body Weight:  45.5 kg  BMI:  Body mass index is 17.73 kg/m.  Estimated Nutritional Needs:   Kcal:  1440 kcals  Protein:  68-90 g  Fluid:  >/= 1.5 L  10/09/20 MS, RDN, LDN, CNSC Registered Dietitian III Clinical Nutrition RD Pager and On-Call Pager Number Located in [place]

## 2020-10-07 NOTE — Evaluation (Signed)
Occupational Therapy Evaluation Patient Details Name: Judith Sullivan MRN: [id number] DOB: 02-Dec-1961 Today's Date: 10/07/2020    History of Present Illness This 59 y.o. female originally admitted to [facility] with vomiting and AMS secondary to possible overdose of medications as she frequently takes too much medication per spouse's report to ED provider.  MRI of brain 10/27 showed small acute to subacute infarcts along the bilateral posterior cerebral convexities, remote lacunar infarcts in the left corona radiata and bil. thalamus.  On 10/28 she was noted to have tonic/clonic seizures and was transferred to [facility] for higher level of care. On arrival at [facility], she was in respiratory distress possibly due to aspiration and was placed on BiPAP then intubated.  EEG LTM performed.  CT of head on 10/28 showed moderate area of edema along the Rt occipital parietal cortex which is presumably an infarct .  PMH includes:  HTN, CTS, COPD, asthma with bronchitis, s/p shoulder surgery, s/p neck fusion   Clinical Impression   Pt admitted with above. She demonstrates the below listed deficits and will benefit from continued OT to maximize safety and independence with BADLs.  Pt presents to OT with generalized weakness, impaired balance, decreased activity tolerance, and significant receptive communication deficits (nods yes to all questions), but can follow commands with gestural cues.  She currently requires total A for ADLs, and was able to tolerate EOB sitting with min A.   Per chart, she lives with her spouse and was independent with ADLs.  She will likely require follow up OT at discharge, however, it is too early to determine best venue as that will be dependent on her progress and spouse's ability to provide needed level of assist.       Follow Up Recommendations  Supervision/Assistance - 24 hour;Home health OT;SNF    Equipment Recommendations  Other (comment) (TBD )    Recommendations for Other Services Speech  consult     Precautions / Restrictions Precautions Precautions: Fall;Other (comment) Precaution Comments: LTM EEG; ETT       Mobility Bed Mobility Overal bed mobility: Needs Assistance Bed Mobility: Supine to Sit;Sit to Supine     Supine to sit: Max assist;+2 for physical assistance Sit to supine: Total assist   General bed mobility comments: requires assist for all aspects     Transfers                 General transfer comment: did not attempt     Balance Overall balance assessment: Needs assistance Sitting-balance support: Feet supported Sitting balance-Leahy Scale: Poor Sitting balance - Comments: requires min A for EOB sitting                                    ADL either performed or assessed with clinical judgement   ADL Overall ADL's : Needs assistance/impaired Eating/Feeding: NPO   Grooming: Wash/dry hands;Maximal assistance;Sitting   Upper Body Bathing: Total assistance;Sitting   Lower Body Bathing: Total assistance;Bed level   Upper Body Dressing : Total assistance;Sitting   Lower Body Dressing: Total assistance;Bed level     Toilet Transfer Details (indicate cue type and reason): unable to safely attempt  Toileting- Clothing Manipulation and Hygiene: Total assistance;Bed level       Functional mobility during ADLs: Maximal assistance (bed mobility )       Vision   Additional Comments: unable to accurately assess.  Pt will fixate on objects and  will track      Perception Perception Comments: to be assessed    Praxis Praxis Praxis-Other Comments: to be assessed     Pertinent Vitals/Pain Pain Assessment: Faces Faces Pain Scale: Hurts a little bit Pain Location: generalized  Pain Descriptors / Indicators: Grimacing Pain Intervention(s): Monitored during session     Hand Dominance Left   Extremity/Trunk Assessment Upper Extremity Assessment Upper Extremity Assessment: RUE deficits/detail RUE Deficits /  Details: Rt UE appears weaker than left during AROM.  Rt wrist drop noted, and little to no finger movement noted  RUE Coordination: decreased gross motor;decreased fine motor   Lower Extremity Assessment Lower Extremity Assessment: Defer to PT evaluation   Cervical / Trunk Assessment Cervical / Trunk Assessment: Other exceptions Cervical / Trunk Exceptions: maintains posterior pelvic tilt with thoracic flexion    Communication Communication Communication: Receptive difficulties;Other (comment) (nods yes to all questions )   Cognition Arousal/Alertness: Awake/alert Behavior During Therapy: Flat affect Overall Cognitive Status: Difficult to assess                                 General Comments: difficult to accurately assess due to ETT tube, and pt nods yes to all questions even those with obvious no responses.  She is unable to follow one step verbal commands, but consistently will follow simple motor commands when verbal command coupled with gesture    General Comments  vss    Exercises     Shoulder Instructions      Home Living Family/patient expects to be discharged to:: Private residence Living Arrangements: Spouse/significant other Available Help at Discharge: Family;Available 24 hours/day Type of Home: House Home Access: Level entry     Home Layout: One level     Bathroom Shower/Tub: Chief Strategy Officer: Standard Bathroom Accessibility: Yes       Additional Comments: information gleaned from chart review       Prior Functioning/Environment Level of Independence: Independent        Comments: information gleaned from chart review         OT Problem List: Decreased strength;Decreased range of motion;Decreased activity tolerance;Impaired balance (sitting and/or standing);Impaired vision/perception;Decreased coordination;Decreased cognition;Decreased safety awareness;Decreased knowledge of use of DME or AE;Decreased knowledge of  precautions;Cardiopulmonary status limiting activity;Impaired UE functional use      OT Treatment/Interventions: Self-care/ADL training;Neuromuscular education;DME and/or AE instruction;Therapeutic activities;Splinting;Manual therapy;Cognitive remediation/compensation;Visual/perceptual remediation/compensation;Patient/family education;Balance training    OT Goals(Current goals can be found in the care plan section) Acute Rehab OT Goals OT Goal Formulation: Patient unable to participate in goal setting Time For Goal Achievement: 10/21/20 Potential to Achieve Goals: Good ADL Goals Pt Will Perform Eating: (P) with set-up;with adaptive utensils;sitting Pt Will Perform Grooming: (P) with min assist;standing Pt Will Perform Upper Body Bathing: (P) with min assist;sitting Pt Will Perform Lower Body Bathing: (P) with min assist;sit to/from stand Pt Will Perform Upper Body Dressing: (P) with min assist;sitting Pt Will Perform Lower Body Dressing: (P) with min assist;sit to/from stand Pt Will Transfer to Toilet: (P) with min assist;ambulating;regular height toilet;bedside commode;grab bars Pt Will Perform Toileting - Clothing Manipulation and hygiene: (P) with min assist;sit to/from stand  OT Frequency: Min 2X/week   Barriers to D/C:            Co-evaluation PT/OT/SLP Co-Evaluation/Treatment: Yes Reason for Co-Treatment: Complexity of the patient's impairments (multi-system involvement) PT goals addressed during session: Mobility/safety with mobility;Strengthening/ROM OT goals addressed  during session: ADL's and self-care;Strengthening/ROM      AM-PAC OT "6 Clicks" Daily Activity     Outcome Measure Help from another person eating meals?: Total Help from another person taking care of personal grooming?: A Lot Help from another person toileting, which includes using toliet, bedpan, or urinal?: Total Help from another person bathing (including washing, rinsing, drying)?: Total Help from  another person to put on and taking off regular upper body clothing?: Total Help from another person to put on and taking off regular lower body clothing?: Total 6 Click Score: 7   End of Session Equipment Utilized During Treatment: Oxygen Nurse Communication: Mobility status  Activity Tolerance: Patient tolerated treatment well Patient left: in bed;with call bell/phone within reach;with bed alarm set;with restraints reapplied;with SCD's reapplied  OT Visit Diagnosis: Unsteadiness on feet (R26.81);Cognitive communication deficit (R41.841);Muscle weakness (generalized) (M62.81) Symptoms and signs involving cognitive functions: Cerebral infarction                Time: 1510-1533 OT Time Calculation (min): 23 min Charges:  OT General Charges $OT Visit: 1 Visit OT Evaluation $OT Eval High Complexity: 1 High  Eber Jones., OTR/L Acute Rehabilitation Services Pager [phone] Office [phone]   Jeani Hawking M 10/07/2020, 5:16 PM

## 2020-10-07 NOTE — H&P (Signed)
Neurology H&P  CC: Encephalopathy and stroke  History is obtained from chart reveiw  HPI: Judith Sullivan is a 59 y.o. female PMHs as reviewed below, chronic pain, morbid obesity, tendency to overmedicate admitted for acute encephalopathy and witnessed seizure found to have stroke and subsequently transferred from OSH for higher level of care.   Chart review: Triage note 10/04/2020: Pt family had left and went camping, came home and found her vomiting, says she sometimes has the tendency to overmedicate. Family states she is unsure how much of her meds she has took. Pt will respond to voice and follow commands but drowsy. Pt family told EMS unsure if she has eaten in 2-4 days. Pt denies pain by shaking head no.   ED note 10/04/2020: Last known to be at baseline 3+ days ago.  Nursing note 10/06/2020: "Nurse in room to give patient bath...right arm begin to shake, Follow by full body convulsions and patient moaning."; "Episode lasted for one minute 30 seconds."    LKW: ~6 days ago tpa given?: No IR Thrombectomy? No Modified Rankin Scale: unclear but likely 0 NIHSS: 25 LOC Responsiveness 1 LOC Questions 2 LOC Commands 2 Horizontal eye movement 2 Visual field 3 Facial palsy 0 - intubated Motor arm - Right arm 3 Motor arm - Left arm 3 Motor leg - Right leg 3 Motor leg - Left leg 3 Limb ataxia 0 Sensory test 0 Language 3 Speech 0 Extinction and inattention 0  Result 25  ROS: Unable to assess due to altered mental status and intubated.   Past Medical History:  Diagnosis Date  . Asthma with bronchitis   . Backache   . COPD (chronic obstructive pulmonary disease) (HCC)   . CTS (carpal tunnel syndrome)   . Hypertension   . Kidney stones    Family History  Problem Relation Age of Onset  . Stroke Mother   . Hypertension Mother   . Heart attack Father    Social History:  reports that she has been smoking. She has a 35.00 pack-year smoking history. She has never used  smokeless tobacco. She reports current alcohol use. She reports previous drug use.   Prior to Admission medications   Medication Sig Start Date End Date Taking? Authorizing Provider  amitriptyline (ELAVIL) 25 MG tablet Take 25 mg by mouth at bedtime. 01/29/19  Yes [provider]  gabapentin (NEURONTIN) 800 MG tablet Take 800 mg by mouth 3 (three) times daily. 01/09/19  Yes [provider]  oxyCODONE (ROXICODONE) 15 MG immediate release tablet Take 15 mg by mouth 4 (four) times daily as needed. 01/09/19  Yes [provider]  valACYclovir (VALTREX) 1000 MG tablet Take 1,000 mg by mouth daily. 09/05/20  Yes [provider]  levofloxacin (LEVAQUIN) 500 MG tablet Take 500 mg by mouth daily. Patient not taking: Reported on 10/04/2020 09/07/20   [provider]  lisinopril (ZESTRIL) 20 MG tablet  11/24/18   [provider]  metFORMIN (GLUCOPHAGE) 500 MG tablet Take by mouth 2 (two) times daily with a meal. Take 2 po qam and 1 po qhs.    [provider]  metoprolol tartrate (LOPRESSOR) 50 MG tablet  11/24/18   [provider]  predniSONE (DELTASONE) 5 MG tablet Take by mouth. Patient not taking: Reported on 10/04/2020 06/15/20   [provider]  sitaGLIPtin (JANUVIA) 100 MG tablet Take 100 mg by mouth daily.    [provider]  tiZANidine (ZANAFLEX) 4 MG tablet Take 4 mg by  mouth every 8 (eight) hours as needed. 01/09/19   [provider]   Exam: Current vital signs: BP 134/80   Pulse 95   Temp 97.6 F (36.4 C) (Axillary)   Resp (!) 28   Ht 5\' 3"  (1.6 m)   Wt 41.9 kg   SpO2 92%   BMI 16.36 kg/m   Physical Exam  Constitutional: Appears well-developed and malnourished.  Psych: Unable to assess due to altered mental status and intubated Eyes: No scleral injection HENT: No OP obstrucion Head: Normocephalic.  Cardiovascular: Normal rate and regular rhythm.  Respiratory: Effort normal and breath  sounds normal to anterior ascultation GI: Soft.  No distension. There is no tenderness.  Skin: WDI  Neuro: Mental Status: Opens eyes to voice consistently however, unable to assess due to altered mental status and intubated.  Pupils are equal, round, ~55mm and sluggishly reactive to light.  EOMI: Patient mostly resisted eye opening. VOR (-) awake.  Motor: Tone and bulk are decreased, strength unable to assess due to altered mental status and intubated. Sensory (including face): Responds to light tactile stimulus meaningfully and consistently (opens eyes as well). Deep Tendon Reflexes: 2+ and symmetric in the biceps and patellae.  Plantars: Toes are downgoing bilaterally. Cerebellar: Unable to assess due to altered mental status and intubated  I have reviewed labs in epic and the pertinent results are: LDL 111/TGs 211 Troponin 18-->20 Na 151 Glu 159 AG 17  I have reviewed the images obtained: NCT head showed 10/04/2020 showed no acute infarction, hemorrhage or mass lesion chronic l>r bilateral mesencephalic lacunes. Follow up NCT head 10/06/2020 re-demonstration of chronic strokes with new area of attenuation in right occipital-parietal cortex. MRI brain 10/05/2020 showed few small acute-subacute infarcts along bilateral posterior cerebral convexities and remote lacunar infarcts in the left corona radiata and bilateral thalami. MRA head and neck 10/05/2020 showed multifocal medium-sized vessel narrowings, presumably atheromatous. CTA head and neck 10/06/2020 showed no acute occlusion, or evident embolic source. Cervical and intracranial atherosclerosis, high-grade right PCA branch stenosis. No flow reducing stenosis or ulceration in the neck.  Primary Diagnosis:  Acute cerebral infarction due to embolism.   Secondary Diagnosis: Essential (primary) hypertension, HLD  Assessment: Judith Sullivan is a 59 y.o. female vascular risk factors with acute toxic encephalopathy, acute embolic  strokes, chronic stroke and witnessed focal to bilateral generalized seizure.  Impression:  Acute ischemic stroke High-grade right PCA branch stenosis. Acute toxic encephalopathy. Focal to bilateral tonic clonic seizure. Hypernatremia. HTN. HLD.  Plan: Patient was seen by neurology and recommended:  1. MULTIFACTORIAL ENCEPHALOPATHY INCLUDING THE ACUTE STROKES AND MEDICATION EFFECT / OVERDOSE/TOXIC METABOLIC ENCEPHALOPATHY.  Suggest discontinuation of a medications of abuse and refer to appropriate psychological services. EEG 2.  VITAMIN B12 DEFICIENCY: This should be replaced. 3.  BILATERAL SMALL ACUTE INFARCTS IN THE DEEP WATERSHED DISTRIBUTIONS: Given the series of events as has happened with medication overdose, I think this is most likely due to low flow state/hypovolemia although cardioembolic phenomena cannot be ruled out. The patient should be vigorously worked up given the relatively young age. Typical stroke workup is suggested but also a TEE may be required. A 30 day event monitor is also recommended. Patient should be placed on dual antiplatelet agents for 30 days and then aspirin 325 mg is recommended afterwards.  Control HTN and DM. Add statin. 4. Correct metabolic derangement. 5. Carotid ultrasound 10/05/2020: bilateral carotid atherosclerosis. No hemodynamically significant right ICA stenosis less than 50% by ultrasound criteria. 6. TTE 10/05/2020:  EF~60 to 65%, no asymmetric wall motion, shunt or thrombus. 7. HbA1c 6.5 8. Continue statin (LDL 111/TGs 211). 9. Continue aspirin daily. 10. Continue clopidogrel 75mg  daily for 3 weeks. 11. Telemetry monitoring for arrhythmia. 12. continue PT/OT/SLP. 13. cEEG to date did not show electrographic seizure continue to monitor.  This patient is critically ill and at significant risk of neurological worsening, death and care requires constant monitoring of vital signs, hemodynamics,respiratory and cardiac monitoring, neurological  assessment, discussion with family, other specialists and medical decision making of high complexity. I spent 75 minutes of neurocritical care time  in the care of  this patient. This was time spent independent of any time provided by nurse practitioner or PA.   Electronically signed by: Dr. Pager: [phone] 10/07/2020, 3:19 AM

## 2020-10-07 NOTE — Progress Notes (Addendum)
NAME:  Judith Sullivan, MRN:  [id number], DOB:  [birth year], LOS: 2 ADMISSION DATE:  10/04/2020, CONSULTATION DATE:  10/07/2020 REFERRING MD:  Dr. Gwenlyn Perking CHIEF COMPLAINT: Respiratory distress, CVA  Brief History   59 year old woman with chronic pain admitted 10/27 with chief complaint of confusion for few days.  Home medications included tizanidine, gabapentin and opiates.  MRI showed evidence of bilateral hemispheric infarcts.  Neurology was consulted and recommended TEE and event monitor with dual antiplatelet therapy.  She was noted to be deficient in vitamin B12.  She developed tonic-clonic seizure witnessed by ICU staff 10/28 and transfer to [facility] was planned after consultation with neurology.  Repeat CT showed area of edema along the right occipital parietal region which is new compared to previous MRI.  CT angiogram of the head and neck did not show any acute occlusion.  Swallow evaluation showed high aspiration risk.  Palliative medicine was consulted. She then developed respiratory distress and was placed on BiPAP.  Required intubation prior to transfer to [facility].   Past Medical History  COPD, smoker Type 2 diabetes Chronic pain  Significant Hospital Events   10/26 Admit  10/28 tonic-clonic seizure, tx to [facility] for Neurology evaluation   Consults:  Neurology  Procedures:  ETT 10/28 >>  Significant Diagnostic Tests:  Head CT 10/26 >> negative MRI brain 10/27 >> small acute to subacute infarcts along the bilateral posterior cerebral convexities.  Remote lacunar infarcts in the left corona radiata and bilateral thalamus. ECHO 10/27 >> poor windows, LVEF ~ 60-65%, no RWMA, grade I diastolic dysfunction Carotid US 10/27 >> unable to assess Left. No hemodynamically significant R ICA stenosis, <50% Head CT 10/28 >> moderate area of edema along the right occipital parietal cortex which has occurred since brain MRI yesterday, presumably interval infarct.  CT angiogram head/neck 10/28 >>  high-grade right PCA branch stenosis EEG 10/29 >> severe diffuse non-specific encephalopathy, no seizures  Micro Data:  COVID 10/26 >> negative  Influenza A/B 10/26 >> negative MRSA PCR 10/28 >> positive  Tracheal aspirate 10/28 >>  Antimicrobials:  Unasyn 10/29 >>  Interim history/subjective:  On vent - 40%, PEEP 5 Glucose range 117-157 Tmax 100.4 / WBC 11.8 I/O 475 ml UOP, +129ml in last 24 hours  Remains on continuous EEG   Objective   Blood pressure (!) 161/88, pulse (!) 106, temperature (!) 100.4 F (38 C), temperature source Axillary, resp. rate (!) 34, height 5\' 3"  (1.6 m), weight 45.4 kg, SpO2 93 %.    Vent Mode: PRVC FiO2 (%):  [40 %-100 %] 40 % Set Rate:  [16 bmp] 16 bmp Vt Set:  [420 mL] 420 mL PEEP:  [5 cmH20] 5 cmH20 Plateau Pressure:  [15 cmH20-21 cmH20] 18 cmH20   Intake/Output Summary (Last 24 hours) at 10/07/2020 0958 Last data filed at 10/07/2020 0500 Gross per 24 hour  Intake 730.12 ml  Output 575 ml  Net 155.12 ml   Filed Weights   10/06/20 1000 10/06/20 1828 10/07/20 0400  Weight: 40.6 kg 41.9 kg 45.4 kg    Examination: General: small adult female lying in bed in NAD on vent  HEENT: MM pink/moist, ETT, mild icterus, pupils =/reactive  Neuro: on propofol, eyes open, looks to right, no follow commands CV: s1s2 rrr, no m/r/g PULM: non-labored on vent, coarse breath sounds bilaterally, no wheezing GI: soft, bsx4 active  Extremities: warm/dry, no edema  Skin: no rashes or lesions  PCXR 10/29 >> images personally reviewed, ETT / NGT in good  position, no acute infiltrate  Resolved Hospital Problem list     Assessment & Plan:   Acute CVA   Initial CT head negative. MRI with small infarcts in bilateral posterior cerebral convexities / watershed distribution. New edema noted on CT head 10/28 along right occipital parietal cortex, presumed interval infarct.  CTA head/neck with high grad right PCA branch stenosis  -post stroke care per neurology    -continue ASA, lipitor, plavix  -consider TEE, event monitor to r/o embolic source  -B12 treatment as below   Acute Metabolic Encephalopathy  Seizure  In setting of new CVA, seizure. Hx of overmedication in past. UDS, Salicylate / tylenol negative.  -PAD protocol for sedation  -RASS goal 0 to -1  -follow EEG  -dexamethasone 4mg  Q8 for cerebral vasogenic edema  Acute Respiratory Failure in setting of CVA, Encephalopathy  Rule Out Aspiration  COPD  -PRVC 8cc/kg as rest mode  -SBT / WUA daily  -continue empiric aspiration coverage  -follow tracheal aspirate  -duoneb Q6 + PRN albuterol   Hypernatremia  -free water 200 ml Q4  -follow Na trend  Uncontrolled DM-2 -hold home metformin, januvia   -SSI, moderate scale  -levemir 5 units QD  Hypertension  -follow BP trend -resume home lopressor at reduced dose, 12.5 mg BID  B12 Deficiency  -monitor, replace    Best practice:  Diet: NPO Pain/Anxiety/Delirium protocol (if indicated): PAD protocol VAP protocol (if indicated): in place DVT prophylaxis: sq heparin GI prophylaxis: protonix Glucose control: SSI Mobility: Bed rest  Code Status: full code Family Communication: Husband Anett Ranker) updated via phone 10/29 Disposition: ICU    Labs   CBC: Recent Labs  Lab 10/04/20 1113 10/04/20 1120 10/05/20 0320 10/06/20 0936  WBC  --  8.3 8.5 11.8*  NEUTROABS  --  7.5 6.9  --   HGB 16.3* 16.9* 16.1* 17.9*  HCT 48.0* 48.7* 47.1* 52.3*  MCV  --  92.8 94.4 96.3  PLT  --  336 327 297    Basic Metabolic Panel: Recent Labs  Lab 10/04/20 1113 10/04/20 1120 10/05/20 0320 10/06/20 0936  NA 142 140 144 151*  K 3.6 3.7 3.3* 3.6  CL 104 100 107 109  CO2  --  23 23 25   GLUCOSE 204* 201* 155* 159*  BUN 35* 37* 38* 43*  CREATININE 0.90 0.93 0.81 0.88  CALCIUM  --  10.0 9.6 10.0  MG  --   --  2.3  --    GFR: Estimated Creatinine Clearance: 49.9 mL/min (by C-G formula based on SCr of 0.88 mg/dL). Recent Labs  Lab  10/04/20 1120 10/05/20 0320 10/06/20 0936  WBC 8.3 8.5 11.8*  LATICACIDVEN 1.4  --   --     Liver Function Tests: Recent Labs  Lab 10/04/20 1120 10/05/20 0320 10/06/20 0936  AST 46* 20 15  ALT 92* 61* 43  ALKPHOS 56 49 47  BILITOT 1.3* 1.4* 1.5*  PROT 7.7 6.9 7.0  ALBUMIN 4.4 4.0 4.2   No results for input(s): LIPASE, AMYLASE in the last 168 hours. Recent Labs  Lab 10/04/20 1120  AMMONIA 9    ABG    Component Value Date/Time   PHART 7.430 10/06/2020 1655   PCO2ART 40.7 10/06/2020 1655   PO2ART 362 (H) 10/06/2020 1655   HCO3 26.5 10/06/2020 1655   TCO2 23 10/04/2020 1113   O2SAT 99.3 10/06/2020 1655     Coagulation Profile: No results for input(s): INR, PROTIME in the last 168 hours.  Cardiac  Enzymes: Recent Labs  Lab 10/04/20 1120  CKTOTAL 961*    HbA1C: Hgb A1c MFr Bld  Date/Time Value Ref Range Status  10/04/2020 10:41 AM 6.5 (H) 4.8 - 5.6 % Final    Comment:    (NOTE)         Prediabetes: 5.7 - 6.4         Diabetes: >6.4         Glycemic control for adults with diabetes: <7.0     CBG: Recent Labs  Lab 10/06/20 0859 10/06/20 1143 10/06/20 1818 10/06/20 1916 10/07/20 0713  GLUCAP 155* 117* 173* 146* 157*     Critical care time: 33 minutes     Canary Brim, MSN, NP-C, AGACNP-BC [facility] 10/07/2020, 9:58 AM   Please see [email or web address] for pager details.   Pulmonary critical care attending:  59 year old female initially brought to [facility]. MRI with bilateral hemispheric infarcts. Transferred for neurologic evaluation. And recommendations for TEE. Rule out embolic disease. Additionally patient had concern for seizure.  BP (!) 166/92 (BP Location: Left Arm)   Pulse 96   Temp (!) 100.4 F (38 C) (Axillary)   Resp (!) 32   Ht 5\' 3"  (1.6 m)   Wt 45.4 kg   SpO2 94%   BMI 17.73 kg/m   General: Elderly female chronically ill-appearing intubated on mechanical life support HEENT: EEG leads in  place. Heart: Regular rhythm S1-S2 Lungs: Bilateral mechanically ventilated breath sounds. Abdomen: Soft nontender nondistended  Labs: Reviewed Chest x-ray: Lines tubes stable no infiltrate.  Assessment: Acute CVA Acute metabolic encephalopathy Seizure Acute respiratory failure requiring intubation mechanical ventilation secondary to above History of COPD, tobacco abuse Type 2 diabetes Hypertension  Plan: Continue low tidal volume ventilation PRVC, vent settings reviewed. SBT SAT as tolerated Aspiration coverage Dexamethasone per neurology Continue stroke work-up per neurology. Aspirin Lipitor plus Plavix continued  This patient is critically ill with multiple organ system failure; which, requires frequent high complexity decision making, assessment, support, evaluation, and titration of therapies. This was completed through the application of advanced monitoring technologies and extensive interpretation of multiple databases. During this encounter critical care time was devoted to patient care services described in this note for 33 minutes.  , DO [facility] 10/07/2020 11:22 AM

## 2020-10-07 NOTE — Procedures (Addendum)
Patient Name: Judith Sullivan  MRN: [id number]  Epilepsy Attending: Charlsie Quest  Referring Physician/Provider: Dr Lindie Spruce Duration: 10/06/2020 2055 to 10/29/221 2055  Patient history: 59yo F with acute BL cerebra infarcts and ams. EEG to evaluate for seizure.  Level of alertness:  comatose--->awake, asleep  AEDs during EEG study: Propofol  Technical aspects: This EEG study was done with scalp electrodes positioned according to the 10-20 International system of electrode placement. Electrical activity was acquired at a sampling rate of 500Hz  and reviewed with a high frequency filter of 70Hz  and a low frequency filter of 1Hz . EEG data were recorded continuously and digitally stored.   Description: EEG initially showed continuous generalized 2-3Hz  delta slowing with overriding 15-18Hz  beta activity which at times appears sharply contoured. After propofol was stopped, EEG showed polymorphic disorganized 8-9hz  generalized alpha activity as well as intermittent generalized 5-6Hz  theta and 13-15hz  beta activity. Sleep was characterized by  Vertex waves, sleep spindles (12-14hz ), maximal frontocentral region. Hyperventilation and photic stimulation were not performed.     ABNORMALITY - Continuous slow, generalized  IMPRESSION: This study was initially suggestive of severe diffuse encephalopathy, nonspecific etiology but likely related to sedation. After propofol ws stopped, eeg showed mild to moderate diffuse encephalopathy, non specific to etiology. No seizures or definite epileptiform discharges were seen throughout the recording.   Judith Sullivan 

## 2020-10-07 NOTE — Progress Notes (Signed)
eLink Physician-Brief Progress Note Patient Name: Judith Sullivan DOB: 1961-09-22 MRN: [id number]   Date of Service  10/07/2020  HPI/Events of Note  Patient transferred to [facility] this evening, there is a handoff note to notify Neurology that patient has arrived [facility].  eICU Interventions  I paged Dr. Thomasena Edis of neurology and notified him.        [name] [name] 10/07/2020, 1:55 AM

## 2020-10-07 NOTE — Progress Notes (Signed)
PHARMACY NOTE:  ANTIMICROBIAL RENAL DOSAGE ADJUSTMENT  Current antimicrobial regimen includes a mismatch between antimicrobial dosage and estimated renal function.  As per policy approved by the Pharmacy & Therapeutics and Medical Executive Committees, the antimicrobial dosage will be adjusted accordingly.  Current antimicrobial dosage:  Unasyn 3g IV every 8 hours  Indication: Aspiration PNA  Renal Function:  Estimated Creatinine Clearance: 49.9 mL/min (by C-G formula based on SCr of 0.88 mg/dL). []      On intermittent HD, scheduled: []      On CRRT    Antimicrobial dosage has been changed to:  Unasyn 3g IV every 6 hours  Additional comments:   Thank you for allowing pharmacy to be a part of this patient's care.  , PharmD, BCPS Clinical Pharmacist Clinical phone for 10/07/2020: [phone] 10/07/2020 9:58 AM   **Pharmacist phone directory can now be found on [email or web address] ([email or web address]).  Listed under [facility].

## 2020-10-07 NOTE — Progress Notes (Signed)
Physical Therapy Treatment Patient Details Name: LITITIA SEN MRN: [id number] DOB: August 25, 1961 Today's Date: 10/07/2020    History of Present Illness This 59 y.o. female originally admitted to [facility] with vomiting and AMS secondary to possible overdose of medications as she frequently takes too much medication per spouse's report to ED provider.  MRI of brain 10/27 showed small acute to subacute infarcts along the bilateral posterior cerebral convexities, remote lacunar infarcts in the left corona radiata and bil. thalamus.  On 10/28 she was noted to have tonic/clonic seizures and was transferred to [facility] for higher level of care. On arrival at [facility], she was in respiratory distress possibly due to aspiration and was placed on BiPAP then intubated.  EEG LTM performed.  CT of head on 10/28 showed moderate area of edema along the Rt occipital parietal cortex which is presumably an infarct .  PMH includes:  HTN, CTS, COPD, asthma with bronchitis, s/p shoulder surgery, s/p neck fusion    PT Comments    Started arousing this afternoon.  Pt not reliable with her yes/No answers, answering Yes to everything in general.  Emphasis on AAAROM of all extremities, transitioning to EOB, working on sitting EOB.   Follow Up Recommendations  SNF;Supervision for mobility/OOB;Supervision/Assistance - 24 hour     Equipment Recommendations  Other (comment) (TBA)    Recommendations for Other Services Speech consult     Precautions / Restrictions Precautions Precautions: Fall;Other (comment) Precaution Comments: LTM EEG; ETT     Mobility  Bed Mobility Overal bed mobility: Needs Assistance Bed Mobility: Supine to Sit;Sit to Supine     Supine to sit: Max assist;+2 for physical assistance Sit to supine: Total assist   General bed mobility comments: requires assist for all aspects   Transfers                 General transfer comment: did not attempt   Ambulation/Gait                 Stairs              Wheelchair Mobility    Modified Rankin (Stroke Patients Only)       Balance Overall balance assessment: Needs assistance Sitting-balance support: Feet supported Sitting balance-Leahy Scale: Poor Sitting balance - Comments: requires min A for EOB sitting                                     Cognition Arousal/Alertness: Awake/alert Behavior During Therapy: Flat affect Overall Cognitive Status: Difficult to assess                                 General Comments: difficult to accurately assess due to ETT tube, and pt nods yes to all questions even those with obvious no responses.  She is unable to follow one step verbal commands, but consistently will follow simple motor commands when verbal command coupled with gesture       Exercises      General Comments General comments (skin integrity, edema, etc.): vss      Pertinent Vitals/Pain Pain Assessment: Faces Faces Pain Scale: Hurts a little bit Pain Location: generalized  Pain Descriptors / Indicators: Grimacing Pain Intervention(s): Monitored during session    Home Living Family/patient expects to be discharged to:: Private residence Living Arrangements: Spouse/significant other Available Help at Discharge: Family;Available  24 hours/day Type of Home: House Home Access: Level entry   Home Layout: One level   Additional Comments: information gleaned from chart review     Prior Function Level of Independence: Independent      Comments: information gleaned from chart review    PT Goals (current goals can now be found in the care plan section) Acute Rehab PT Goals PT Goal Formulation: With patient Time For Goal Achievement: 10/19/20 Potential to Achieve Goals: Good Progress towards PT goals: Progressing toward goals    Frequency    Min 5X/week      PT Plan Current plan remains appropriate    Co-evaluation PT/OT/SLP Co-Evaluation/Treatment: Yes Reason for  Co-Treatment: Complexity of the patient's impairments (multi-system involvement) PT goals addressed during session: Mobility/safety with mobility;Strengthening/ROM OT goals addressed during session: ADL's and self-care;Strengthening/ROM      AM-PAC PT "6 Clicks" Mobility   Outcome Measure  Help needed turning from your back to your side while in a flat bed without using bedrails?: Total Help needed moving from lying on your back to sitting on the side of a flat bed without using bedrails?: Total Help needed moving to and from a bed to a chair (including a wheelchair)?: Total Help needed standing up from a chair using your arms (e.g., wheelchair or bedside chair)?: Total Help needed to walk in hospital room?: Total Help needed climbing 3-5 steps with a railing? : Total 6 Click Score: 6    End of Session   Activity Tolerance: Patient tolerated treatment well;Patient limited by fatigue Patient left: in bed;with call bell/phone within reach;with bed alarm set;with SCD's reapplied Nurse Communication: Mobility status PT Visit Diagnosis: Unsteadiness on feet (R26.81);Other abnormalities of gait and mobility (R26.89);Muscle weakness (generalized) (M62.81)     Time: 7939-0300 PT Time Calculation (min) (ACUTE ONLY): 27 min  Charges:  $Therapeutic Activity: 8-22 mins                     10/07/2020  Jacinto Halim., PT Acute Rehabilitation Services [phone]  (pager) [phone]  (office)   Eliseo Gum Normalee Sistare 10/07/2020, 5:22 PM

## 2020-10-07 NOTE — Progress Notes (Signed)
STROKE TEAM PROGRESS NOTE   INTERVAL HISTORY I have personally reviewed history of presenting illness, electronic medical records and imaging films in PACS.  She presented to [facility] with confusion and altered mental status on 10/05/2028 for few days and with suspicion that she had overdosed on her home medications which included tizanidine, gabapentin and opiates.  MRI scan showed bilateral parieto-occipital and frontal mostly subcortical punctate infarcts however CT scan done yesterday shows increased paradoxic edema in the right parieto-occipital region suggesting further infarction.  CT angiogram of the head and neck do not show any large vessel stenosis or occlusion.  Patient also had episode of tonic-clonic seizure witnessed by ICU staff yesterday and has been started on Keppra.  She was intubated for respiratory distress.  She is presently sedated and intubated.  Vital signs are stable.  T-max has been 100.4.  White count has been elevated to 11.8 this morning. Long-term EEG monitoring in process and shows generalized slowing and no definite ongoing seizure activity Vitals:   10/07/20 0700 10/07/20 0800 10/07/20 0808 10/07/20 0812  BP: (!) 155/86     Pulse: 93   100  Resp: (!) 30   (!) 30  Temp:  (!) 100.4 F (38 C)    TempSrc:  Axillary    SpO2: 92%  93% 93%  Weight:      Height:       CBC:  Recent Labs  Lab 10/04/20 1120 10/04/20 1120 10/05/20 0320 10/06/20 0936  WBC 8.3   < > 8.5 11.8*  NEUTROABS 7.5  --  6.9  --   HGB 16.9*   < > 16.1* 17.9*  HCT 48.7*   < > 47.1* 52.3*  MCV 92.8   < > 94.4 96.3  PLT 336   < > 327 297   < > = values in this interval not displayed.   Basic Metabolic Panel:  Recent Labs  Lab 10/05/20 0320 10/06/20 0936  NA 144 151*  K 3.3* 3.6  CL 107 109  CO2 23 25  GLUCOSE 155* 159*  BUN 38* 43*  CREATININE 0.81 0.88  CALCIUM 9.6 10.0  MG 2.3  --    Lipid Panel:  Recent Labs  Lab 10/06/20 0616 10/06/20 0616 10/06/20 1651   CHOL 199  --   --   TRIG 225*   < > 211*  HDL 43  --   --   CHOLHDL 4.6  --   --   VLDL 45*  --   --   LDLCALC 111*  --   --    < > = values in this interval not displayed.   HgbA1c:  Recent Labs  Lab 10/04/20 1041  HGBA1C 6.5*   Urine Drug Screen:  Recent Labs  Lab 10/04/20 1106  LABOPIA NONE DETECTED  COCAINSCRNUR NONE DETECTED  LABBENZ NONE DETECTED  AMPHETMU NONE DETECTED  THCU NONE DETECTED  LABBARB NONE DETECTED    Alcohol Level  Recent Labs  Lab 10/04/20 1120  ETH <10    IMAGING past 24 hours EEG  Result Date: 10/06/2020 Lora Havens, MD     10/06/2020  9:02 PM Patient Name: Judith Sullivan MRN: [id number] Epilepsy Attending: Lora Havens Referring Physician/Provider: Dr Tennis Must Date: 10/06/2020 Duration: Patient history: 59yo F with acute BL cerebra infarcts and ams. EEG to evaluate for seizure. Level of alertness:  comatose AEDs during EEG study: Propofol Technical aspects: This EEG study was done with scalp electrodes positioned according to  the 10-20 International system of electrode placement. Electrical activity was acquired at a sampling rate of 500Hz  and reviewed with a high frequency filter of 70Hz  and a low frequency filter of 1Hz . EEG data were recorded continuously and digitally stored. Description: EEG showed intermittent generalized 6-9Hz  theta-alpha activity as well as generalized 15-18Hz  beta activiy. Brief periods of generalized eeg attenuation were also noted. Multiple left and right temporal sharp transients were seen. Hyperventilation and photic stimulation were not performed.   ABNORMALITY - Intermittent slow, generalized - Background attenuation, generalized IMPRESSION: This study is suggestive of profound diffuse encephalopathy, nonspecific etiology but likely related to sedation. No seizures or definite epileptiform discharges were seen throughout the recording. If suspicion for interictal/ictal activity remains a concern, a prolonged  study can be considered. Lora Havens   Portable Chest xray  Result Date: 10/07/2020 CLINICAL DATA:  Intubation. EXAM: PORTABLE CHEST 1 VIEW COMPARISON:  Chest x-ray 10/06/2020. FINDINGS: Endotracheal tube and NG tube in stable position. Heart size normal. Lungs are clear. Mild basilar pleural thickening again noted consistent scarring. No pneumothorax. Surgical clips right upper quadrant. Mild thoracic spine scoliosis concave left. IMPRESSION: 1. Lines and tubes in stable position. 2. No acute cardiopulmonary disease. Electronically Signed   By: Marcello Moores  Register   On: 10/07/2020 05:32   Portable Chest x-ray  Result Date: 10/06/2020 CLINICAL DATA:  Intubation. EXAM: PORTABLE CHEST 1 VIEW COMPARISON:  Earlier film, same date. FINDINGS: The endotracheal tube is 3.6 cm above the carina. The NG tube is coursing down the esophagus and into the stomach. The cardiac silhouette, mediastinal and hilar contours are normal. The lungs are clear.  No pleural effusions or pneumothorax. The bony thorax is intact. IMPRESSION: 1. Endotracheal tube and NG tubes in good position. 2. No acute cardiopulmonary findings. Electronically Signed   By: Marijo Sanes M.D.   On: 10/06/2020 16:53    PHYSICAL EXAM Frail cachectic malnourished looking middle-aged lady not in distress.  She is intubated and sedated. . Afebrile. Head is nontraumatic. Neck is supple without bruit.    Cardiac exam no murmur or gallop. Lungs are clear to auscultation. Distal pulses are well felt. Neurological Exam : Patient is sedated and intubated.  She is however awake with eyes open.  She follows gaze in all directions but will not follow commands consistently.  She will only occasionally stick out her tongue or wiggle her toes to command with not consistently.  She blinks to threat bilaterally.  Face is symmetric.  Tongue midline.  She withdraws all 4 extremities to painful stimuli but will not maintain tone against  gravity. ASSESSMENT/PLAN Judith Sullivan is a 59 y.o. female with history of chronic pain, morbid obesity, tendency to overmedicate admitted for acute encephalopathy and witnessed seizure found to have stroke and subsequently transferred from OSH to [place] for higher level of care.   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   MRI 10/27 Few small B posterior cerebral convexity infarcts and remote lacunes in L corona radiata and B thalami  MRA 10/27 Multifocal medium size vessel narrowing, severe proximal L M2 branch stenosis   CT head 10/28  new R occipitoparietal hypodensity, chronic infarcts  CTA head & neck 10/28 no LVO. Diffuse atherosclerosis throughout. High-grade R PCA branch stenosis   2D Echo EF 60-65%. No source of embolus   LDL 111  HgbA1c 6.5  VTE prophylaxis - Lovenox 30 mg sq daily   No antithrombotic prior to admission, now on aspirin 325 mg daily. Continue aspirin alone for now.   Therapy recommendations:  pending   Disposition:  pending   Palliative care met w/ family 10/28 - on going aggressive care at this time. Will continue to follow  Acute Respiratory Failure Underlying COPD  Intubated 10/28  Unasyn 10/28>>  CCM on board   Hypertensive Emergency  BP as high as 228/191  Stable tdaoy . Permissive hypertension (OK if < 220/120) but gradually normalize in 5-7 days . Long-term BP goal normotensive  Hyperlipidemia  Home meds:  No statin  LDL 111, goal < 70  No on lipitor 20  Continue statin at  discharge  Diabetes type II   HgbA1c 6.5, goal < 7.0  CBGs  SSI  Dysphagia . Secondary to stroke . NPO . Speech on board   Other Stroke Risk Factors  Cigarette smoker, advised to stop smoking  Family hx stroke (mother)  Other Active Problems  Multifactorial encephalopathy, resolved, felt to be post ictal. Not related to strokes.  Vit B deficiency  Leukocytosis WBC 11.8  Elevated Hgb 17.9   Chronic pain  Hospital day # 2  Patient presented with altered mental status and confusion initially felt to be due to encephalopathy secondary to medication overdose however MRI scan shows multiple bilateral embolic infarcts and neurovascular imaging is unremarkable.  Recommend check TEE for endocarditis or cardiac source of embolism and lower extremity venous Dopplers for DVT.  Transcranial Doppler bubble study for PFO.  Check blood cultures if remains febrile.  No family available at the bedside for discussion.  Wean off ventilatory support as per critical care team.  Discussed with Noe Gens critical care nurse practitioner. This patient is critically ill and at significant risk of neurological worsening, death and care requires constant monitoring of vital signs, hemodynamics,respiratory and cardiac monitoring, extensive review of multiple databases, frequent neurological assessment, discussion with family, other specialists and medical decision making of high complexity.I have made any additions or clarifications directly to the above note.This critical care time does not reflect procedure time, or teaching time or supervisory time of PA/NP/Med Resident etc but could involve care discussion time.  I spent 30 minutes of neurocritical care time  in the care of  this patient.   Antony Contras, MD  To contact Stroke Continuity provider, please refer to [email or web address] After hours, contact General Neurology

## 2020-10-08 ENCOUNTER — Inpatient Hospital Stay (HOSPITAL_COMMUNITY): Payer: 59

## 2020-10-08 DIAGNOSIS — R569 Unspecified convulsions: Secondary | ICD-10-CM | POA: Diagnosis not present

## 2020-10-08 DIAGNOSIS — J9601 Acute respiratory failure with hypoxia: Secondary | ICD-10-CM

## 2020-10-08 DIAGNOSIS — I639 Cerebral infarction, unspecified: Secondary | ICD-10-CM | POA: Diagnosis not present

## 2020-10-08 DIAGNOSIS — I6340 Cerebral infarction due to embolism of unspecified cerebral artery: Principal | ICD-10-CM

## 2020-10-08 DIAGNOSIS — D72829 Elevated white blood cell count, unspecified: Secondary | ICD-10-CM

## 2020-10-08 DIAGNOSIS — E870 Hyperosmolality and hypernatremia: Secondary | ICD-10-CM

## 2020-10-08 LAB — MAGNESIUM: Magnesium: 2 mg/dL (ref 1.7–2.4)

## 2020-10-08 LAB — GLUCOSE, CAPILLARY
Glucose-Capillary: 170 mg/dL — ABNORMAL HIGH (ref 70–99)
Glucose-Capillary: 152 mg/dL — ABNORMAL HIGH (ref 70–99)
Glucose-Capillary: 145 mg/dL — ABNORMAL HIGH (ref 70–99)
Glucose-Capillary: 115 mg/dL — ABNORMAL HIGH (ref 70–99)
Glucose-Capillary: 177 mg/dL — ABNORMAL HIGH (ref 70–99)
Glucose-Capillary: 117 mg/dL — ABNORMAL HIGH (ref 70–99)

## 2020-10-08 LAB — CBC
HCT: 43.8 % (ref 36.0–46.0)
Hemoglobin: 14.2 g/dL (ref 12.0–15.0)
MCH: 32.4 pg (ref 26.0–34.0)
MCHC: 32.4 g/dL (ref 30.0–36.0)
MCV: 100 fL (ref 80.0–100.0)
Platelets: 168 10*3/uL (ref 150–400)
RBC: 4.38 MIL/uL (ref 3.87–5.11)
RDW: 12.6 % (ref 11.5–15.5)
WBC: 13.7 10*3/uL — ABNORMAL HIGH (ref 4.0–10.5)
nRBC: 0 % (ref 0.0–0.2)

## 2020-10-08 LAB — BASIC METABOLIC PANEL
Anion gap: 9 (ref 5–15)
BUN: 36 mg/dL — ABNORMAL HIGH (ref 6–20)
CO2: 24 mmol/L (ref 22–32)
Calcium: 8.6 mg/dL — ABNORMAL LOW (ref 8.9–10.3)
Chloride: 120 mmol/L — ABNORMAL HIGH (ref 98–111)
Creatinine, Ser: 0.9 mg/dL (ref 0.44–1.00)
GFR, Estimated: >60 mL/min (ref >60)
Glucose, Bld: 188 mg/dL — ABNORMAL HIGH (ref 70–99)
Potassium: 3.9 mmol/L (ref 3.5–5.1)
Sodium: 153 mmol/L — ABNORMAL HIGH (ref 135–145)

## 2020-10-08 LAB — URINALYSIS, COMPLETE (UACMP) WITH MICROSCOPIC
Bacteria, UA: NONE SEEN
Bilirubin Urine: NEGATIVE
Glucose, UA: 150 mg/dL — AB
Ketones, ur: NEGATIVE mg/dL
Leukocytes,Ua: NEGATIVE
Nitrite: NEGATIVE
Protein, ur: NEGATIVE mg/dL
Specific Gravity, Urine: 1.027 (ref 1.005–1.030)
pH: 6 (ref 5.0–8.0)

## 2020-10-08 LAB — PHOSPHORUS: Phosphorus: 2.6 mg/dL (ref 2.5–4.6)

## 2020-10-08 MED ORDER — SODIUM CHLORIDE 0.45 % IV SOLN: INTRAVENOUS | Status: DC

## 2020-10-08 MED ORDER — PANTOPRAZOLE SODIUM 40 MG PO TBEC
40.0000 mg | DELAYED_RELEASE_TABLET | Freq: Every day | ORAL | Status: DC
  Administered 2020-10-08: 40 mg via ORAL
  Filled 2020-10-08: qty 1

## 2020-10-08 MED ORDER — IPRATROPIUM-ALBUTEROL 0.5-2.5 (3) MG/3ML IN SOLN
3.0000 mL | Freq: Three times a day (TID) | RESPIRATORY_TRACT | Status: DC
  Administered 2020-10-08 (×2): 3 mL via RESPIRATORY_TRACT
  Filled 2020-10-08 (×3): qty 3

## 2020-10-08 MED ORDER — CLOPIDOGREL BISULFATE 75 MG PO TABS: 75.0000 mg | ORAL_TABLET | Freq: Every day | ORAL | Status: DC

## 2020-10-08 MED ORDER — ATORVASTATIN CALCIUM 40 MG PO TABS
40.0000 mg | ORAL_TABLET | Freq: Every day | ORAL | Status: DC
  Administered 2020-10-08 – 2020-10-11 (×4): 40 mg via ORAL
  Filled 2020-10-08 (×3): qty 1

## 2020-10-08 MED ORDER — VANCOMYCIN HCL IN DEXTROSE 1-5 GM/200ML-% IV SOLN
1000.0000 mg | Freq: Once | INTRAVENOUS | Status: AC
  Administered 2020-10-08: 1000 mg via INTRAVENOUS
  Filled 2020-10-08: qty 200

## 2020-10-08 MED ORDER — ATORVASTATIN CALCIUM 40 MG PO TABS
40.0000 mg | ORAL_TABLET | Freq: Every day | ORAL | Status: DC
  Filled 2020-10-08: qty 1

## 2020-10-08 MED ORDER — LEVETIRACETAM 500 MG PO TABS
500.0000 mg | ORAL_TABLET | Freq: Two times a day (BID) | ORAL | Status: DC
  Administered 2020-10-08 – 2020-10-11 (×8): 500 mg via ORAL
  Filled 2020-10-08 (×8): qty 1

## 2020-10-08 MED ORDER — VITAMIN D 25 MCG (1000 UNIT) PO TABS
1000.0000 [IU] | ORAL_TABLET | Freq: Every day | ORAL | Status: DC
  Administered 2020-10-08 – 2020-10-11 (×4): 1000 [IU] via ORAL
  Filled 2020-10-08 (×3): qty 1

## 2020-10-08 MED ORDER — VANCOMYCIN HCL IN DEXTROSE 1-5 GM/200ML-% IV SOLN
1000.0000 mg | INTRAVENOUS | Status: DC
  Administered 2020-10-09: 1000 mg via INTRAVENOUS
  Filled 2020-10-08: qty 200

## 2020-10-08 MED ORDER — METOPROLOL TARTRATE 25 MG PO TABS: 25.0000 mg | ORAL_TABLET | Freq: Two times a day (BID) | ORAL | Status: DC

## 2020-10-08 MED ORDER — ORAL CARE MOUTH RINSE
15.0000 mL | Freq: Two times a day (BID) | OROMUCOSAL | Status: DC
  Administered 2020-10-08 (×2): 15 mL via OROMUCOSAL

## 2020-10-08 MED ORDER — FREE WATER: 250.0000 mL | Status: DC

## 2020-10-08 MED ORDER — METOPROLOL TARTRATE 25 MG PO TABS
25.0000 mg | ORAL_TABLET | Freq: Two times a day (BID) | ORAL | Status: DC
  Administered 2020-10-08 – 2020-10-11 (×8): 25 mg via ORAL
  Filled 2020-10-08 (×8): qty 1

## 2020-10-08 NOTE — Progress Notes (Addendum)
NAME:  Judith Sullivan, MRN:  [id number], DOB:  1961/08/01, LOS: 3 ADMISSION DATE:  10/04/2020, CONSULTATION DATE:  10/08/2020 REFERRING MD:  Dr. Gwenlyn Perking CHIEF COMPLAINT: Respiratory distress, CVA  Brief History   59 year old woman with chronic pain admitted 10/27 with chief complaint of confusion for few days.  Home medications included tizanidine, gabapentin and opiates.  MRI showed evidence of bilateral hemispheric infarcts.  Neurology was consulted and recommended TEE and event monitor with dual antiplatelet therapy.  She was noted to be deficient in vitamin B12.  She developed tonic-clonic seizure witnessed by ICU staff 10/28 and transfer to [facility] was planned after consultation with neurology.  Repeat CT showed area of edema along the right occipital parietal region which is new compared to previous MRI.  CT angiogram of the head and neck did not show any acute occlusion.  Swallow evaluation showed high aspiration risk.  Palliative medicine was consulted. She then developed respiratory distress and was placed on BiPAP.  Required intubation prior to transfer to [facility].   Past Medical History  COPD, smoker Type 2 diabetes Chronic pain  Significant Hospital Events   10/26 Admit  10/28 tonic-clonic seizure, tx to [facility] for Neurology evaluation   Consults:  Neurology  Procedures:  ETT 10/28 >>  Significant Diagnostic Tests:  Head CT 10/26 >> negative MRI brain 10/27 >> small acute to subacute infarcts along the bilateral posterior cerebral convexities.  Remote lacunar infarcts in the left corona radiata and bilateral thalamus. ECHO 10/27 >> poor windows, LVEF ~ 60-65%, no RWMA, grade I diastolic dysfunction Carotid US 10/27 >> unable to assess Left. No hemodynamically significant R ICA stenosis, <50% Head CT 10/28 >> moderate area of edema along the right occipital parietal cortex which has occurred since brain MRI yesterday, presumably interval infarct.  CT angiogram head/neck 10/28 >>  high-grade right PCA branch stenosis EEG 10/29 >> severe diffuse non-specific encephalopathy, no seizures  Micro Data:  COVID 10/26 >> negative  Influenza A/B 10/26 >> negative MRSA PCR 10/28 >> positive  Tracheal aspirate 10/28 >>  Antimicrobials:  Unasyn 10/29 >>  Interim history/subjective:  Looks comfortable   Objective   Blood pressure 137/72, pulse 69, temperature 98.2 F (36.8 C), temperature source Oral, resp. rate (Abnormal) 26, height 5\' 3"  (1.6 m), weight 45 kg, SpO2 94 %.    Vent Mode: PRVC FiO2 (%):  [40 %] 40 % Set Rate:  [16 bmp] 16 bmp Vt Set:  [420 mL] 420 mL PEEP:  [5 cmH20] 5 cmH20 Plateau Pressure:  [13 cmH20-15 cmH20] 13 cmH20   Intake/Output Summary (Last 24 hours) at 10/08/2020 0824 Last data filed at 10/08/2020 0700 Gross per 24 hour  Intake 2936.23 ml  Output 1125 ml  Net 1811.23 ml   Filed Weights   10/06/20 1828 10/07/20 0400 10/08/20 0500  Weight: 41.9 kg 45.4 kg 45 kg    Examination: General 59 year old female actually looks much older than stated age HENT LTM monitor in place. Orally intubated. No JVD MMM Pulm clear w/ occ rhonchi excellent VT in 400s on SBT and no accessory use  Card rrr abd soft  Neuro awake and alert no focal def. Moves all ext gu cl yellow   Resolved Hospital Problem list     Assessment & Plan:   Acute CVA   MRI with small infarcts in bilateral posterior cerebral convexities / watershed distribution. New edema noted on CT head 10/28 along right occipital parietal cortex, presumed interval infarct.  CTA  head/neck with high grad right PCA branch stenosis Plan Cont secondary stroke prevention w/ asa, plavix and lipitor  B12 replacement  Additional rx per stroke team     Acute Metabolic Encephalopathy; likely post-ictal state s/p Seizure  In setting of new CVA, seizure. Hx of overmedication in past. UDS, Salicylate / tylenol negative.  Plan Cont dexamethazone for cerebral vasogenic edema PAD protocol rass  goal 0   Acute Respiratory Failure in setting of CVA, Encephalopathy  COPD  Possible aspiration  -was covered for potential aspiration but cxr clear  -mental status much improved, cxr clear, vent mechanics are excellent  Plan extubate F/u pending resp culture.  She is day 3 unasyn. Will stop at day 5 (unless cultures end-up neg could then stop early) Cont BDs   Hypernatremia -->worse. Free water def 1.9 liters; her current 200 ml q 4 should actually completely replace this but it continues to rise [employer] free water to 250 q 4 (will be conservative given recent vasogenic edema on CT head) Repeat chem in am  If no improvement 10/31 start d5w  Uncontrolled DM-2 -holding home metformin, januvia   -glycemic control achieved Plan Cont current mod ssi and levemir w/ goal glucose 140-180   Hypertension  Resumed lopressor at reduced dose 10/28 (takes 50 bid) [employer] to 25mg  bid  B12 Deficiency  Plan Cont replacement    Best practice:  Diet: NPO Pain/Anxiety/Delirium protocol (if indicated): PAD protocol VAP protocol (if indicated): in place DVT prophylaxis: sq heparin GI prophylaxis: protonix Glucose control: SSI Mobility: Bed rest  Code Status: full code Family Communication: Husband Judith Sullivan) updated via phone 10/29 Disposition: ICU    My cct 32 min  11/29 ACNP-BC [facility] Pulmonary/Critical Care Pager # [phone] OR # [phone] if no answer       Attending attestation:  Extubated this morning. Doing well post-extubation. Working with therapy now.   BP (!) 155/71   Pulse (!) 57   Temp 98.7 F (37.1 C) (Axillary)   Resp (!) 27   Ht 5\' 3"  (1.6 m)   Wt 45 kg   SpO2 100%   BMI 17.57 kg/m   ill appearing elderly woman sitting up in bed working with SLP [place]/AT, eyes anicteric, oral mucosa moist Rhonchi bilaterally, strong, wet-sounding cough.. Breathing comfortably on [place]. RRR, no murmurs abd soft, NT Awake and alert, moving all  extremities, clock drawing with numbers inappropriately placed- counted to 18 with #12 in 6:00 position No rashes No peripheral edeme  I/O +1.9L (+2.6L this admission)  CXR personally reviewed> ETT, NGT in appropriate position, no opacities.   Na+ 153 Cl 120 Bicarb 24 BUN/ Cr 36/0.9   Assessment & plan: Acute respiratory failure with hypoxia -extubated this morning -wean supplemental O2 as able to maintain SpO2 >90% -bronchodilators PRN  Worsening leukocytosis, last fever 10/29. Normal CXR.  -Trach aspirate 10/28 pending. -con't empiric unasyn (started 10/29); adding vanc given MRSA + nares and GPC in sputum -UA  Acute encephalopathy Acute cerebral edema New onset seizure CVAs bilaterally -con't keepra -con't steroids -ok for regular diet and thin liquids; will stop 1/2 NS gtt -con't PT, OT, SLP  Hypernatremia, potentially due to high sodium load from antibiotics -correct with ad-lib PO intake -AM BMP  Hyperglycemia due to steroids -basal bolus insulin + SSI -goal BG 140-180    11/28, DO 10/08/20 2:12 PM [facility]

## 2020-10-08 NOTE — Procedures (Signed)
Extubation Procedure Note  Patient Details:   Name: Judith Sullivan DOB: [birth year] MRN: [id number]   Airway Documentation:    Vent end date: 10/08/20 Vent end time: 0857   Evaluation  O2 sats: stable throughout Complications: No apparent complications Patient did tolerate procedure well. Bilateral Breath Sounds: Clear, Diminished   Pt extubated to 4L [place] per MD order. Pt had positive cuff leak and no stridor noted. Pt able to voice her name.  Guss Bunde 10/08/2020, 8:58 AM

## 2020-10-08 NOTE — Evaluation (Signed)
Speech Language Pathology Evaluation Patient Details Name: Judith Sullivan MRN: [id number] DOB: Apr 30, 1961 Today's Date: 10/08/2020 Time: 8416-6063 SLP Time Calculation (min) (ACUTE ONLY): 24 min  Problem List:  Patient Active Problem List   Diagnosis Date Noted  . Acute encephalopathy   . Cerebrovascular accident (CVA) (HCC)   . Seizures (HCC)   . Palliative care by specialist   . Goals of care, counseling/discussion   . AMS (altered mental status) 10/05/2020  . Metabolic encephalopathy 10/04/2020  . 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 SCIATICA 01/28/2008   Past Medical History:  Past Medical History:  Diagnosis Date  . Asthma with bronchitis   . Backache   . COPD (chronic obstructive pulmonary disease) (HCC)   . CTS (carpal tunnel syndrome)   . Hypertension   . Kidney stones    Past Surgical History:  Past Surgical History:  Procedure Laterality Date  . CESAREAN SECTION    . CHOLECYSTECTOMY    . kidney stone removal    . neck fusion    . SHOULDER SURGERY     HPI:  59 year old woman presenting with confusion and found to have bilateral small acute cerebral infarcts with a watershed distribution, encephalopathic, then developed seizure and respiratory distress requiring BiPAP with suspicion for aspiration. Pt had witnessed seizure, transferred to [facility]. Intubated 10/06/09/30. PMH; COPD, HTN, asthma with bronchitis. CXR No new consolidation or edema. No pleural effusion. No pneumothorax. BSE 10/27 at [facility] rec NPO (no swallow initiated).   Assessment / Plan / Recommendation Clinical Impression  Pt's speech and language have significantly improved from initial assessment 10/27 at [facility]. Area of primary concern is her cognition and SLUMS scored an 8/30. Her self assessment of her performance was "pretty good" and she scored an 8/30 on the SLUMS exam. To improve independence at discharge therapy should focus on awareness, memory and problem solving. Per this assessment she would benefit from  continued ST on CIR or ouptatient.         SLP Assessment  SLP Recommendation/Assessment: Patient needs continued Speech Lanaguage Pathology Services SLP Visit Diagnosis: Cognitive communication deficit (R41.841)    Follow Up Recommendations  Outpatient SLP    Frequency and Duration min 2x/week  2 weeks      SLP Evaluation Cognition  Overall Cognitive Status: Impaired/Different from baseline Arousal/Alertness: Awake/alert Orientation Level: Oriented to person;Oriented to place;Oriented to situation;Disoriented to time Attention: Sustained Sustained Attention: Appears intact (for verbal) Memory: Impaired Memory Impairment: Retrieval deficit Awareness: Impaired Awareness Impairment: Intellectual impairment;Emergent impairment;Anticipatory impairment Problem Solving: Impaired Problem Solving Impairment: Verbal basic Safety/Judgment: Impaired       Comprehension  Auditory Comprehension Overall Auditory Comprehension: Appears within functional limits for tasks assessed Visual Recognition/Discrimination Discrimination: Not tested Reading Comprehension Reading Status: Not tested    Expression Expression Primary Mode of Expression: Verbal Verbal Expression Overall Verbal Expression: Impaired Initiation: No impairment Level of Generative/Spontaneous Verbalization: Sentence Repetition: Impaired Level of Impairment: Sentence level Naming: Impairment Divergent:  (6 items in category (1 min)) Written Expression Dominant Hand: Left Written Expression:  (TBA)   Oral / Motor  Oral Motor/Sensory Function Overall Oral Motor/Sensory Function: Within functional limits Motor Speech Overall Motor Speech: Appears within functional limits for tasks assessed Respiration: Within functional limits Phonation: Normal Resonance: Within functional limits Articulation: Within functional limitis Intelligibility: Intelligible Motor Planning: Witnin functional limits   GO                     Royce Macadamia 10/08/2020, 2:57 PM  Breck Coons [place].Ed Nurse, children's [phone] Office [phone]

## 2020-10-08 NOTE — Progress Notes (Signed)
STROKE TEAM PROGRESS NOTE   INTERVAL HISTORY Daughter and RN are at the bedside. Pt was extubated this morning, tolerating well, able to talk and making jokes. Still not orientated to time or age. Moving all extremities. Found to have right wrist drop. Per daughter, this has been going on for several weeks, has appointment with Dr. Allena Katz in 11/2020. Otherwise moving all extremities equally.    Vitals:   10/08/20 0400 10/08/20 0500 10/08/20 0600 10/08/20 0700  BP: (!) 159/73 135/65 (!) 143/74 137/72  Pulse: 70 68 62 79  Resp: (!) 24 20 20  (!) 26  Temp: 98.2 F (36.8 C)     TempSrc: Oral     SpO2: 92% 92% 92% 93%  Weight:  45 kg    Height:       CBC:  Recent Labs  Lab 10/04/20 1120 10/04/20 1120 10/05/20 0320 10/05/20 0320 10/06/20 0936 10/08/20 0446  WBC 8.3   < > 8.5   < > 11.8* 13.7*  NEUTROABS 7.5  --  6.9  --   --   --   HGB 16.9*   < > 16.1*   < > 17.9* 14.2  HCT 48.7*   < > 47.1*   < > 52.3* 43.8  MCV 92.8   < > 94.4   < > 96.3 100.0  PLT 336   < > 327   < > 297 168   < > = values in this interval not displayed.   Basic Metabolic Panel:  Recent Labs  Lab 10/06/20 0936 10/07/20 1452 10/07/20 1816 10/08/20 0446  NA 151*  --   --  153*  K 3.6  --   --  3.9  CL 109  --   --  120*  CO2 25  --   --  24  GLUCOSE 159*  --   --  188*  BUN 43*  --   --  36*  CREATININE 0.88  --   --  0.90  CALCIUM 10.0  --   --  8.6*  MG  --    < > 2.2 2.0  PHOS  --    < > 2.0* 2.6   < > = values in this interval not displayed.   Lipid Panel:  Recent Labs  Lab 10/06/20 0616 10/06/20 0616 10/06/20 1651  CHOL 199  --   --   TRIG 225*   < > 211*  HDL 43  --   --   CHOLHDL 4.6  --   --   VLDL 45*  --   --   LDLCALC 111*  --   --    < > = values in this interval not displayed.   HgbA1c:  Recent Labs  Lab 10/04/20 1041  HGBA1C 6.5*   Urine Drug Screen:  Recent Labs  Lab 10/04/20 1106  LABOPIA NONE DETECTED  COCAINSCRNUR NONE DETECTED  LABBENZ NONE DETECTED   AMPHETMU NONE DETECTED  THCU NONE DETECTED  LABBARB NONE DETECTED    Alcohol Level  Recent Labs  Lab 10/04/20 1120  ETH <10    IMAGING past 24 hours DG CHEST PORT 1 VIEW  Result Date: 10/08/2020 CLINICAL DATA:  Acute encephalopathy EXAM: PORTABLE CHEST 1 VIEW COMPARISON:  10/07/2020 FINDINGS: Endotracheal and partially imaged enteric tubes again identified. Slight elevation of the left hemidiaphragm. No new consolidation or edema. No pleural effusion. No pneumothorax. Stable heart size. IMPRESSION: Stable lines and tubes.  Lungs remain clear. Electronically Signed   By: 10/09/2020  Patel M.D.   On: 10/08/2020 07:18   Overnight EEG with video  Result Date: 10/07/2020 Charlsie Quest, MD     10/07/2020  9:05 AM Patient Name: Judith Sullivan MRN: [id number] Epilepsy Attending: Charlsie Quest Referring Physician/Provider: Dr Lindie Spruce Duration: 10/06/2020 2055 to 10/29/221 0900  Patient history: 59yo F with acute BL cerebra infarcts and ams. EEG to evaluate for seizure.  Level of alertness:  comatose  AEDs during EEG study: Propofol  Technical aspects: This EEG study was done with scalp electrodes positioned according to the 10-20 International system of electrode placement. Electrical activity was acquired at a sampling rate of 500Hz  and reviewed with a high frequency filter of 70Hz  and a low frequency filter of 1Hz . EEG data were recorded continuously and digitally stored.  Description: EEG showed continuous generalized 2-3Hz  delta slowing with overriding 15-18Hz  beta activity which at times appears sharply contoured. Hyperventilation and photic stimulation were not performed.    ABNORMALITY - Continuous sow, generalized  IMPRESSION: This study is suggestive of severe diffuse encephalopathy, nonspecific etiology but likely related to sedation. No seizures or definite epileptiform discharges were seen throughout the recording.  Priyanka    PHYSICAL EXAM  Temp:  [98.1 F  (36.7 C)-100.3 F (37.9 C)] 99.3 F (37.4 C) (10/30 0800) Pulse Rate:  [62-110] 75 (10/30 1000) Resp:  [15-32] 25 (10/30 1000) BP: (108-174)/(65-94) 163/81 (10/30 1000) SpO2:  [92 %-100 %] 100 % (10/30 1000) FiO2 (%):  [40 %] 40 % (10/30 0806) Weight:  [45 kg] 45 kg (10/30 0500)  General - Well nourished, well developed, in no apparent distress.  Ophthalmologic - fundi not visualized due to noncooperation.  Cardiovascular - Regular rhythm and rate.  Mental Status -  Level of arousal and orientation to place (hospital), and person were intact. However, not orientated to name of hospital, age or time. Language including expression, naming, repetition, comprehension was assessed and found intact.  Cranial Nerves II - XII - II - Visual field intact OU. III, IV, VI - Extraocular movements intact. V - Facial sensation intact bilaterally. VII - Facial movement intact bilaterally. VIII - Hearing & vestibular intact bilaterally. X - Palate elevates symmetrically. XI - Chin turning & shoulder shrug intact bilaterally. XII - Tongue protrusion intact.  Motor Strength - The patient's strength was 3+/5 BUE and 3/5 BLEs, and pronator drift was absent. However, right wrist drop present.  Bulk was decreased bilaterally and fasciculations were absent.   Motor Tone - Muscle tone was assessed at the neck and appendages and was normal.  Reflexes - The patient's reflexes were symmetrical in all extremities and she had no pathological reflexes.  Sensory - Light touch, temperature/pinprick were assessed and were symmetrical.    Coordination - The patient had normal movements in the hands with no ataxia or dysmetria.  Tremor was absent.  Gait and Station - deferred.   ASSESSMENT/PLAN Ms. ONELIA CADMUS is a 59 y.o. female with history of chronic pain, morbid obesity, tendency to overmedicate admitted for acute encephalopathy and witnessed seizure found to have stroke and subsequently transferred  from OSH to [facility] for higher level of care.   Stroke:   Right posterior MCA, punctate b/l MCA/PCA, right MCA/PCA, right MCA/ACA infarcts secondary unknown source. Needs to rule out endocarditis  CT head 10/26 No acute abnormality.   MRI 10/27 Few small B posterior cerebral convexity infarcts and remote lacunes in L corona radiata and B thalami  CT head 10/28 new R occipitoparietal hypodensity, chronic infarcts  CTA head & neck 10/28 no LVO. Diffuse atherosclerosis throughout. High-grade R PCA branch stenosis   2D Echo EF 60-65%. No source of embolus   Agree with Dr. Pearlean Brownie, recommend TEE to rule out endocarditis. If neg, will consider loop recorder to rule out afib  LE Venous Dopplers - neg  LDL 111  HgbA1c 6.5  UDS - negative  VTE prophylaxis - Lovenox 30 mg sq daily   No antithrombotic prior to admission, now on aspirin 325 mg daily. Avoid DAPT for now, pending to r/o endocarditis   Therapy recommendations:  SNF  Disposition:  pending   Acute Respiratory Failure Underlying COPD  Intubated 10/28, post Sz  CCM on board  Extubated 10/30  Tolerating well  Seizure  Focal to bilateral tonic clonic seizures - witnessed by staff  EEG 10/28 generalized intermittent slowing, generalized background attenuation  LT EEG 10/06/2020 2055 to 10/30/221 0900:  milddiffuse encephalopathy, nonspecific etiology but likely related to sedation.No seizures ordefiniteepileptiform discharges were seen throughout the record  Keppra was given one time dose 750mg  on 10/28  Will add keppra 500mg  bid once po access  Fever and leukocytosis  T Max - 100.4->98.2->100.3->afebrile  Leukocytosis - 8.5->11.8->13.7 (Decadron started 10/28 by CCM)  IV Unasyn started 10/29 for aspiration pneumonia  Blood culture pending   Hypertensive Emergency  BP as high as 228/191  Stable now . Long-term BP goal normotensive  Hyperlipidemia  Home meds:  No  statin  LDL 111, goal < 70  Now on lipitor 40  Continue statin at discharge  Diabetes type II, controlled  HgbA1c 6.5, goal < 7.0  CBGs  SSI  PCP follow up  Dysphagia . Secondary to stroke . NPO now post extubation . Speech on board  R wrist drop  Per daughter, it has been several weeks  Has appointment with Dr. 11/28 on 11/21/20  Needs outpt EMG/NCS with Dr. Allena Katz  Will ask PT for wrist brace  Continue PT/OT  Hypernatremia   Na 151->153  Was on FW 200 Q4h  Currently NPO  Will put on 1/2NS @ 50  Encourage po intake once po access  Tobacco abuse  Current smoker  Smoking cessation counseling provided  Pt is willing to quit  Other Stroke Risk Factors  Family hx stroke (mother)  Other Active Problems  Vit B deficiency - on supplement  Elevated Hgb 17.9   Chronic pain  Hospital day # 3  This patient is critically ill due to stroke, seizure, fever and leukocytosis, hypernatremia and at significant risk of neurological worsening, death form recurrent stroke, hemorrhagic conversion, sepsis, status epilepticus. This patient's care requires constant monitoring of vital signs, hemodynamics, respiratory and cardiac monitoring, review of multiple databases, neurological assessment, discussion with family, other specialists and medical decision making of high complexity. I spent 35 minutes of neurocritical care time in the care of this patient. I had long discussion with pt and daughter at bedside, updated pt current condition, treatment plan and potential prognosis, and answered all the questions. They expressed understanding and appreciation.   11/23/20, MD PhD Stroke Neurology 10/08/2020 10:57 AM    To contact Stroke Continuity provider, please refer to [facility] Plan. After hours, contact General Neurology

## 2020-10-08 NOTE — Procedures (Addendum)
Patient Name:Charle CYBILL URIEGAS BFX:[id number] Epilepsy Attending:Brittnay Pigman Annabelle Harman Referring Physician/Provider:Dr Lindie Spruce Duration: 10/07/2020 2055 to 10/30/221 0947  Patient history:59yo F with acute BL cerebra infarcts and ams. EEG to evaluate for seizure.  Level of alertness:awake, asleep  AEDs during EEG study:Non  Technical aspects: This EEG study was done with scalp electrodes positioned according to the 10-20 International system of electrode placement. Electrical activity was acquired at a sampling rate of 500Hz  and reviewed with a high frequency filter of 70Hz  and a low frequency filter of 1Hz . EEG data were recorded continuously and digitally stored.   Description: No clear posterior dominant rhythm was seen. EEG showed polymorphic disorganized 8-9hz  generalized alpha activity as well as intermittent generalized 5-6Hz  theta and 13-15hz  beta activity. Sleep was characterized by  vertex waves, sleep spindles (12-14hz ), maximal frontocentral region. Hyperventilation and photic stimulation were not performed.   ABNORMALITY - Intermittent slow, generalized  IMPRESSION: This study is suggestive of mild diffuse encephalopathy, non specific to etiology. No seizures ordefiniteepileptiform discharges were seen throughout the recording.   Alzena Gerber 

## 2020-10-08 NOTE — Progress Notes (Signed)
VASCULAR LAB    Bilateral lower extremity venous duplex has been performed.  See CV proc for preliminary results.   Tyniah Kastens, RVT 10/08/2020, 1:27 PM

## 2020-10-08 NOTE — Progress Notes (Signed)
LTM EEG discontinued - FP1  Fp2 skin breakdown at [facility]. [facility] notified

## 2020-10-08 NOTE — Evaluation (Signed)
Clinical/Bedside Swallow Evaluation Patient Details  Name: Judith Sullivan MRN: [id number] Date of Birth: [birth year]  Today's Date: 10/08/2020 Time: SLP Start Time (ACUTE ONLY): 1333 SLP Stop Time (ACUTE ONLY): 1357 SLP Time Calculation (min) (ACUTE ONLY): 24 min  Past Medical History:  Past Medical History:  Diagnosis Date  . Asthma with bronchitis   . Backache   . COPD (chronic obstructive pulmonary disease) (HCC)   . CTS (carpal tunnel syndrome)   . Hypertension   . Kidney stones    Past Surgical History:  Past Surgical History:  Procedure Laterality Date  . CESAREAN SECTION    . CHOLECYSTECTOMY    . kidney stone removal    . neck fusion    . SHOULDER SURGERY     HPI:  59 year old woman presenting with confusion and found to have bilateral small acute cerebral infarcts with a watershed distribution, encephalopathic, then developed seizure and respiratory distress requiring BiPAP with suspicion for aspiration. Pt had witnessed seizure, transferred to [facility]. Intubated 10/06/09/30. PMH; COPD, HTN, asthma with bronchitis. CXR No new consolidation or edema. No pleural effusion. No pneumothorax. BSE 10/27 at [facility] rec NPO (no swallow initiated).   Assessment / Plan / Recommendation Clinical Impression  Pt's swallow function has subjectively improved from assessment on 10/27. She is alert, responsive with improved awareness and daughter at bedside. Her oral-motor exam was unremarkable and denture cream donned for pt placement of dentures. Oral cohesion/manipulation and transit across textures was consistently timely and and efficient. No difficulty appreciated with consecutive straw sips water. Her breathing was unlabored and coordinated. Pt should continue regular texture, thin liquids, cup/straw, pills with water, sit upright. No further treatment needed for swallow.      SLP Visit Diagnosis: Dysphagia, unspecified (R13.10)    Aspiration Risk  Mild aspiration risk    Diet  Recommendation Regular;Thin liquid   Liquid Administration via: Cup;Straw Medication Administration: Whole meds with liquid Supervision: Patient able to self feed;Intermittent supervision to cue for compensatory strategies Compensations: Slow rate;Small sips/bites Postural Changes: Seated upright at 90 degrees    Other  Recommendations Oral Care Recommendations: Oral care BID   Follow up Recommendations None      Frequency and Duration            Prognosis        Swallow Study   General HPI: 59 year old woman presenting with confusion and found to have bilateral small acute cerebral infarcts with a watershed distribution, encephalopathic, then developed seizure and respiratory distress requiring BiPAP with suspicion for aspiration. Pt had witnessed seizure, transferred to [facility]. Intubated 10/06/09/30. PMH; COPD, HTN, asthma with bronchitis. CXR No new consolidation or edema. No pleural effusion. No pneumothorax. BSE 10/27 at [facility] rec NPO (no swallow initiated). Type of Study: Bedside Swallow Evaluation Previous Swallow Assessment:  (BSE at [facility] 10/27) Diet Prior to this Study: Regular;Thin liquids Temperature Spikes Noted: No Respiratory Status: Nasal cannula History of Recent Intubation: Yes Length of Intubations (days):  (little < 2 days) Date extubated: 10/08/20 (9:00) Behavior/Cognition: Alert;Cooperative;Pleasant mood Oral Cavity Assessment: Dry Oral Care Completed by SLP: Yes Oral Cavity - Dentition: Dentures, top;Dentures, bottom Vision: Functional for self-feeding Self-Feeding Abilities: Able to feed self Patient Positioning: Upright in bed Baseline Vocal Quality: Normal Volitional Cough: Strong Volitional Swallow: Able to elicit    Oral/Motor/Sensory Function Overall Oral Motor/Sensory Function: Within functional limits   Ice Chips Ice chips: Not tested   Thin Liquid Thin Liquid: Within functional limits Presentation: Cup;Straw Oral Phase Impairments:   (  none) Oral Phase Functional Implications:  (none) Pharyngeal  Phase Impairments:  (none)    Nectar Thick Nectar Thick Liquid: Not tested   Honey Thick Honey Thick Liquid: Not tested   Puree Puree: Not tested   Solid     Solid: Within functional limits      Roque Cash, Breck Coons 10/08/2020,2:36 PM  Breck Coons [place].Ed Nurse, children's [phone] Office [phone]

## 2020-10-08 NOTE — Progress Notes (Signed)
Pharmacy Antibiotic Note  Judith Sullivan is a 59 y.o. female admitted on 10/04/2020 with acute stroke.  Pharmacy has been consulted for vancomycin dosing for pneumonia; also on Unasyn. MRSA PCR positive. Tm 100.3, WBC 13.7 (on scheduled dexamethasone). Renal function at baseline and stable.   Plan: Vancomycin 1000 mg IV every 24 hours.  Goal trough 15-20 mcg/mL.  Monitor renal function, clinical progress, cultures/sensitivities F/U LOT and de-escalate as able Vancomycin levels as clinically indicated  Height: 5\' 3"  (160 cm) Weight: 45 kg (99 lb 3.3 oz) IBW/kg (Calculated) : 52.4  Temp (24hrs), Avg:98.9 F (37.2 C), Min:98.1 F (36.7 C), Max:100.3 F (37.9 C)  Recent Labs  Lab 10/04/20 1113 10/04/20 1120 10/05/20 0320 10/06/20 0936 10/08/20 0446  WBC  --  8.3 8.5 11.8* 13.7*  CREATININE 0.90 0.93 0.81 0.88 0.90  LATICACIDVEN  --  1.4  --   --   --     Estimated Creatinine Clearance: 48.4 mL/min (by C-G formula based on SCr of 0.9 mg/dL).    No Known Allergies  Antimicrobials this admission: Unasyn 10/29 >>  Vancomycin 10/30 >>   Dose adjustments this admission:   Microbiology results: 10/30 BCx: pending  10/28 TA: reinc (mod GPC, rare GNR in gram stain)  10/28 MRSA PCR: positive  Thank you for involving pharmacy in this patient's care.  11/28, PharmD, BCPS Clinical Pharmacist Clinical phone for 10/08/2020 until 3p is [phone] 10/08/2020 2:24 PM  **Pharmacist phone directory can be found on [email or web address] listed under [facility]**

## 2020-10-09 DIAGNOSIS — J309 Allergic rhinitis, unspecified: Secondary | ICD-10-CM | POA: Diagnosis not present

## 2020-10-09 DIAGNOSIS — J1520 Pneumonia due to staphylococcus, unspecified: Secondary | ICD-10-CM | POA: Diagnosis not present

## 2020-10-09 LAB — CBC
HCT: 44.5 % (ref 36.0–46.0)
Hemoglobin: 14.9 g/dL (ref 12.0–15.0)
MCH: 32 pg (ref 26.0–34.0)
MCHC: 33.5 g/dL (ref 30.0–36.0)
MCV: 95.7 fL (ref 80.0–100.0)
Platelets: 173 10*3/uL (ref 150–400)
RBC: 4.65 MIL/uL (ref 3.87–5.11)
RDW: 12 % (ref 11.5–15.5)
WBC: 16.1 10*3/uL — ABNORMAL HIGH (ref 4.0–10.5)
nRBC: 0.1 % (ref 0.0–0.2)

## 2020-10-09 LAB — GLUCOSE, CAPILLARY
Glucose-Capillary: 145 mg/dL — ABNORMAL HIGH (ref 70–99)
Glucose-Capillary: 127 mg/dL — ABNORMAL HIGH (ref 70–99)
Glucose-Capillary: 170 mg/dL — ABNORMAL HIGH (ref 70–99)
Glucose-Capillary: 155 mg/dL — ABNORMAL HIGH (ref 70–99)
Glucose-Capillary: 163 mg/dL — ABNORMAL HIGH (ref 70–99)

## 2020-10-09 LAB — COMPREHENSIVE METABOLIC PANEL
ALT: 20 U/L (ref 0–44)
AST: 19 U/L (ref 15–41)
Albumin: 3.4 g/dL — ABNORMAL LOW (ref 3.5–5.0)
Alkaline Phosphatase: 37 U/L — ABNORMAL LOW (ref 38–126)
Anion gap: 9 (ref 5–15)
BUN: 23 mg/dL — ABNORMAL HIGH (ref 6–20)
CO2: 27 mmol/L (ref 22–32)
Calcium: 9.2 mg/dL (ref 8.9–10.3)
Chloride: 106 mmol/L (ref 98–111)
Creatinine, Ser: 0.83 mg/dL (ref 0.44–1.00)
GFR, Estimated: >60 mL/min (ref >60)
Glucose, Bld: 178 mg/dL — ABNORMAL HIGH (ref 70–99)
Potassium: 4.7 mmol/L (ref 3.5–5.1)
Sodium: 142 mmol/L (ref 135–145)
Total Bilirubin: 0.8 mg/dL (ref 0.3–1.2)
Total Protein: 5.5 g/dL — ABNORMAL LOW (ref 6.5–8.1)

## 2020-10-09 MED ORDER — LORATADINE 10 MG PO TABS
10.0000 mg | ORAL_TABLET | Freq: Every day | ORAL | Status: DC
  Administered 2020-10-09 – 2020-10-11 (×3): 10 mg via ORAL
  Filled 2020-10-09 (×3): qty 1

## 2020-10-09 MED ORDER — INSULIN ASPART 100 UNIT/ML ~~LOC~~ SOLN
0.0000 [IU] | Freq: Three times a day (TID) | SUBCUTANEOUS | Status: DC
  Administered 2020-10-09 (×3): 3 [IU] via SUBCUTANEOUS
  Administered 2020-10-10 (×2): 5 [IU] via SUBCUTANEOUS
  Administered 2020-10-11: 2 [IU] via SUBCUTANEOUS
  Administered 2020-10-11: 5 [IU] via SUBCUTANEOUS
  Administered 2020-10-11: 3 [IU] via SUBCUTANEOUS
  Administered 2020-10-12: 2 [IU] via SUBCUTANEOUS

## 2020-10-09 NOTE — Progress Notes (Signed)
Physical Therapy Treatment Patient Details Name: Judith Sullivan MRN: [id number] DOB: [birth year] Today's Date: 10/09/2020    History of Present Illness This 59 y.o. female originally admitted to [facility] with vomiting and AMS secondary to possible overdose of medications as she frequently takes too much medication per spouse's report to ED provider.  MRI of brain 10/27 showed small acute to subacute infarcts along the bilateral posterior cerebral convexities, remote lacunar infarcts in the left corona radiata and bil. thalamus.  On 10/28 she was noted to have tonic/clonic seizures and was transferred to [facility] for higher level of care. On arrival at [facility], she was in respiratory distress possibly due to aspiration and was placed on BiPAP then intubated.  EEG LTM performed.  CT of head on 10/28 showed moderate area of edema along the Rt occipital parietal cortex which is presumably an infarct, Extubated 10/30.  PMH includes:  HTN, CTS, COPD, asthma with bronchitis, s/p shoulder surgery, s/p neck fusion    PT Comments    Patient progressing well towards PT goals. Tolerated transfers and gait training today with Min A (HHA of 2) for balance/support. Noted to have Sp02 range from 86-94% on RA. Cues needed for pursed lip breathing. Continues to have cognitive deficits relating to orientation, safety, memory, problem solving. If pt continues to progress and has support at home, may be able to d/c home with HHPT. Will follow acutely.    Follow Up Recommendations  SNF;Supervision for mobility/OOB     Equipment Recommendations  Other (comment) (TBA)    Recommendations for Other Services       Precautions / Restrictions Precautions Precautions: Fall;Other (comment) Precaution Comments: watch 02 Restrictions Weight Bearing Restrictions: No    Mobility  Bed Mobility Overal bed mobility: Needs Assistance Bed Mobility: Supine to Sit;Rolling Rolling: Min assist   Supine to sit: Min assist;HOB elevated      General bed mobility comments: Assist to elevate trunk to get to EOB. No dizziness. Head forward position. Rolling to right/left for pericare with Min A for techqniue.  Transfers Overall transfer level: Needs assistance Equipment used: 1 person hand held assist Transfers: Sit to/from Stand Sit to Stand: Min assist         General transfer comment: Min A to power to standing with help for balance. Transferred to chair post ambulation.  Ambulation/Gait Ambulation/Gait assistance: Min assist;+2 safety/equipment Gait Distance (Feet): 24 Feet Assistive device: 2 person hand held assist Gait Pattern/deviations: Step-through pattern;Decreased stride length;Narrow base of support Gait velocity: decreased   General Gait Details: Slow, mildly unsteady gait with narrow boS and min A for balance. Sp02 dropped to 86% on RA, cues for pursed lip breathing.   Stairs             Wheelchair Mobility    Modified Rankin (Stroke Patients Only)       Balance Overall balance assessment: Needs assistance Sitting-balance support: Feet supported;No upper extremity supported Sitting balance-Leahy Scale: Fair Sitting balance - Comments: supervision for safety   Standing balance support: During functional activity Standing balance-Leahy Scale: Poor Standing balance comment: Requires external support in standing.                            Cognition Arousal/Alertness: Awake/alert Behavior During Therapy: WFL for tasks assessed/performed Overall Cognitive Status: Impaired/Different from baseline Area of Impairment: Orientation;Memory;Following commands;Problem solving;Safety/judgement                 Orientation  Level: Disoriented to;Situation   Memory: Decreased short-term memory Following Commands: Follows one step commands with increased time Safety/Judgement: Decreased awareness of safety;Decreased awareness of deficits   Problem Solving: Slow processing;Requires  verbal cues General Comments: KNows it is Halloween but thinks it is 2019. Poor STM. Follows commands well. Pt sitting in liquid stool wtihout awareness.      Exercises      General Comments General comments (skin integrity, edema, etc.): Sp02 ranged from 86-94% on RA during session. Donned 1L 02 at end of session. RN aware.      Pertinent Vitals/Pain Pain Assessment: Faces Faces Pain Scale: Hurts a little bit Pain Location: abdomen Pain Descriptors / Indicators: Grimacing;Aching Pain Intervention(s): Monitored during session    Home Living Family/patient expects to be discharged to:: Private residence Living Arrangements: Spouse/significant other                  Prior Function            PT Goals (current goals can now be found in the care plan section) Progress towards PT goals: Progressing toward goals    Frequency    Min 4X/week      PT Plan Current plan remains appropriate;Frequency needs to be updated    Co-evaluation              AM-PAC PT "6 Clicks" Mobility   Outcome Measure  Help needed turning from your back to your side while in a flat bed without using bedrails?: A Little Help needed moving from lying on your back to sitting on the side of a flat bed without using bedrails?: A Little Help needed moving to and from a bed to a chair (including a wheelchair)?: A Little Help needed standing up from a chair using your arms (e.g., wheelchair or bedside chair)?: A Little Help needed to walk in hospital room?: A Little Help needed climbing 3-5 steps with a railing? : A Lot 6 Click Score: 17    End of Session Equipment Utilized During Treatment: Gait belt Activity Tolerance: Patient tolerated treatment well Patient left: in chair;with call bell/phone within reach;with chair alarm set Nurse Communication: Mobility status PT Visit Diagnosis: Unsteadiness on feet (R26.81);Other abnormalities of gait and mobility (R26.89);Muscle weakness  (generalized) (M62.81)     Time: 1027-2536 PT Time Calculation (min) (ACUTE ONLY): 25 min  Charges:  $Gait Training: 8-22 mins $Therapeutic Activity: 8-22 mins                     Vale Haven, PT, DPT Acute Rehabilitation Services Pager [phone] Office [phone]       Blake Divine A Lanier Ensign 10/09/2020, 11:16 AM

## 2020-10-09 NOTE — Progress Notes (Signed)
STROKE TEAM PROGRESS NOTE   INTERVAL HISTORY PT and OT at bedside. Pt lying in bed, awake alert, now orientated to month and age, not to year still, better than yesterday. She had bowel movement this am but she stated that she did not feel it coming. Currently, pt is being cleaned.    Vitals:   10/09/20 0353 10/09/20 0400 10/09/20 0500 10/09/20 0600  BP:  (!) 142/70 138/69 (!) 157/64  Pulse:  65 (!) 52 (!) 55  Resp:  (!) 26 20 (!) 22  Temp: 98.2 F (36.8 C)     TempSrc: Oral     SpO2:  96% 96% 98%  Weight:      Height:       CBC:  Recent Labs  Lab 10/04/20 1120 10/04/20 1120 10/05/20 0320 10/05/20 0320 10/06/20 0936 10/08/20 0446  WBC 8.3   < > 8.5   < > 11.8* 13.7*  NEUTROABS 7.5  --  6.9  --   --   --   HGB 16.9*   < > 16.1*   < > 17.9* 14.2  HCT 48.7*   < > 47.1*   < > 52.3* 43.8  MCV 92.8   < > 94.4   < > 96.3 100.0  PLT 336   < > 327   < > 297 168   < > = values in this interval not displayed.   Basic Metabolic Panel:  Recent Labs  Lab 10/06/20 0936 10/07/20 1452 10/07/20 1816 10/08/20 0446  NA 151*  --   --  153*  K 3.6  --   --  3.9  CL 109  --   --  120*  CO2 25  --   --  24  GLUCOSE 159*  --   --  188*  BUN 43*  --   --  36*  CREATININE 0.88  --   --  0.90  CALCIUM 10.0  --   --  8.6*  MG  --    < > 2.2 2.0  PHOS  --    < > 2.0* 2.6   < > = values in this interval not displayed.   Lipid Panel:  Recent Labs  Lab 10/06/20 0616 10/06/20 0616 10/06/20 1651  CHOL 199  --   --   TRIG 225*   < > 211*  HDL 43  --   --   CHOLHDL 4.6  --   --   VLDL 45*  --   --   LDLCALC 111*  --   --    < > = values in this interval not displayed.   HgbA1c:  Recent Labs  Lab 10/04/20 1041  HGBA1C 6.5*   Urine Drug Screen:  Recent Labs  Lab 10/04/20 1106  LABOPIA NONE DETECTED  COCAINSCRNUR NONE DETECTED  LABBENZ NONE DETECTED  AMPHETMU NONE DETECTED  THCU NONE DETECTED  LABBARB NONE DETECTED    Alcohol Level  Recent Labs  Lab 10/04/20 1120  ETH  <10    IMAGING past 24 hours VAS US LOWER EXTREMITY VENOUS (DVT)  Result Date: 10/08/2020  Lower Venous DVT Study Indications: Stroke.  Comparison Study: No prior study Performing Technologist: Sherren Kernsandace Kanady RVS  Examination Guidelines: A complete evaluation includes B-mode imaging, spectral Doppler, color Doppler, and power Doppler as needed of all accessible portions of each vessel. Bilateral testing is considered an integral part of a complete examination. Limited examinations for reoccurring indications may be performed as noted. The reflux portion  of the exam is performed with the patient in reverse Trendelenburg.  +---------+---------------+---------+-----------+----------+-------------------+ RIGHT    CompressibilityPhasicitySpontaneityPropertiesThrombus Aging      +---------+---------------+---------+-----------+----------+-------------------+ CFV      Full                                         pulsatile waveforms                                                       noted               +---------+---------------+---------+-----------+----------+-------------------+ SFJ      Full                                                             +---------+---------------+---------+-----------+----------+-------------------+ FV Prox  Full                                                             +---------+---------------+---------+-----------+----------+-------------------+ FV Mid   Full                                                             +---------+---------------+---------+-----------+----------+-------------------+ FV DistalFull                                                             +---------+---------------+---------+-----------+----------+-------------------+ PFV      Full                                                             +---------+---------------+---------+-----------+----------+-------------------+ POP      Full                                          pulsatile waveforms                                                       noted               +---------+---------------+---------+-----------+----------+-------------------+ PTV  Full                                                             +---------+---------------+---------+-----------+----------+-------------------+ PERO     Full                                                             +---------+---------------+---------+-----------+----------+-------------------+   +---------+---------------+---------+-----------+----------+-------------------+ LEFT     CompressibilityPhasicitySpontaneityPropertiesThrombus Aging      +---------+---------------+---------+-----------+----------+-------------------+ CFV      Full                                         pulsatile waveforms                                                       noted               +---------+---------------+---------+-----------+----------+-------------------+ SFJ      Full                                                             +---------+---------------+---------+-----------+----------+-------------------+ FV Prox  Full                                                             +---------+---------------+---------+-----------+----------+-------------------+ FV Mid   Full                                                             +---------+---------------+---------+-----------+----------+-------------------+ FV DistalFull                                                             +---------+---------------+---------+-----------+----------+-------------------+ PFV      Full                                                             +---------+---------------+---------+-----------+----------+-------------------+ POP  Full                                         pulsatile waveforms                                                        noted               +---------+---------------+---------+-----------+----------+-------------------+ PTV      Full                                                             +---------+---------------+---------+-----------+----------+-------------------+ PERO     Full                                                             +---------+---------------+---------+-----------+----------+-------------------+    Summary: BILATERAL: - No evidence of deep vein thrombosis seen in the lower extremities, bilaterally. - RIGHT: pulsatile waveforms noted  LEFT: Pulsatile waveforms noted.  *See table(s) above for measurements and observations. Electronically signed by Waverly Ferrari MD on 10/08/2020 at 1:51:35 PM.    Final     PHYSICAL EXAM  Temp:  [98.2 F (36.8 C)-99.3 F (37.4 C)] 98.2 F (36.8 C) (10/31 0353) Pulse Rate:  [52-81] 55 (10/31 0600) Resp:  [18-28] 22 (10/31 0600) BP: (109-182)/(58-85) 157/64 (10/31 0600) SpO2:  [89 %-100 %] 98 % (10/31 0600) FiO2 (%):  [40 %] 40 % (10/30 0806)  General - Well nourished, well developed, in no apparent distress.  Ophthalmologic - fundi not visualized due to noncooperation.  Cardiovascular - Regular rhythm and rate.  Mental Status -  Level of arousal and orientation to place (hospital), age and month and person were intact. However, not orientated to year. Language including expression, naming, repetition, comprehension was assessed and found intact.  Cranial Nerves II - XII - II - Visual field intact OU. III, IV, VI - Extraocular movements intact. V - Facial sensation intact bilaterally. VII - Facial movement intact bilaterally. VIII - Hearing & vestibular intact bilaterally. X - Palate elevates symmetrically. XI - Chin turning & shoulder shrug intact bilaterally. XII - Tongue protrusion intact.  Motor Strength - The patient's strength was 3+/5 BUE and 3/5 BLEs, and pronator drift was  absent. However, chronic right wrist drop present.  Bulk was decreased bilaterally and fasciculations were absent.   Motor Tone - Muscle tone was assessed at the neck and appendages and was normal.  Reflexes - The patient's reflexes were symmetrical in all extremities and she had no pathological reflexes.  Sensory - Light touch, temperature/pinprick were assessed and were symmetrical.    Coordination - The patient had normal movements in the hands with no ataxia or dysmetria.  Tremor was absent.  Gait and Station - deferred.   ASSESSMENT/PLAN Ms. YANEL DOMBROSKY is a 59 y.o. female with history  of chronic pain, morbid obesity, tendency to overmedicate admitted for acute encephalopathy and witnessed seizure found to have stroke and subsequently transferred from OSH to [facility] for higher level of care.   Stroke:   Right posterior MCA, punctate b/l MCA/PCA, right MCA/PCA, right MCA/ACA infarcts secondary unknown source. Needs to rule out endocarditis  CT head 10/26 No acute abnormality.   MRI 10/27 Few small B posterior cerebral convexity infarcts and remote lacunes in L corona radiata and B thalami  CT head 10/28 new R occipitoparietal hypodensity, chronic infarcts  CTA head & neck 10/28 no LVO. Diffuse atherosclerosis throughout. High-grade R PCA branch stenosis   2D Echo EF 60-65%. No source of embolus   Agree with Dr. Pearlean Brownie, recommend TEE to rule out endocarditis. If neg, will consider loop recorder to rule out afib. (TEE ordered for Mon 11/1 - NPO after midnight - will hold Levemir insulin tonight - SSI in interim)  LE Venous Dopplers - neg  LDL 111  HgbA1c 6.5  UDS - negative  VTE prophylaxis - Lovenox 30 mg sq daily   No antithrombotic prior to admission, now on aspirin 325 mg daily. Avoid DAPT for now, pending to r/o endocarditis.   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 - witnessed by staff  EEG 10/28 generalized intermittent slowing, generalized background attenuation  LT EEG 10/06/2020 2055 to 10/30/221 0900:  milddiffuse encephalopathy, nonspecific etiology but likely related to sedation.No seizures ordefiniteepileptiform discharges were seen throughout the record  Keppra was given one time dose 750mg  on 10/28  On keppra 500mg  bid now  Fever and leukocytosis  T Max - 100.4->98.2->100.3->afebrile  Leukocytosis - 8.5->11.8->13.7->16.1 (Decadron started 10/28 by CCM)  Sputum culture - rare staph Aureus - pending  IV Unasyn 10/29>>  vanco 10/31>>  Blood culture - 10/30 -  pending   Hypertensive Emergency  BP as high as 228/191  Stable now  . Long-term BP goal normotensive . Avoid low BP due to high-grade R PCA branch stenosis   Hyperlipidemia  Home meds:  No statin  LDL 111, goal < 70  Now on lipitor 40  Continue statin at discharge  Diabetes type II, controlled  HgbA1c 6.5, goal < 7.0  CBGs  SSI  PCP follow up  Dysphagia . Secondary to stroke . Passed swallow post extubation . On diet now . Speech on board  R wrist drop  Per daughter, it has been several weeks  Has appointment with Dr. 11/28 on 11/21/20  Needs outpt EMG/NCS with Dr. Allena Katz  Defer to PT for wrist brace  Continue PT/OT  Hypernatremia, resolved    Na 151->153->142  Was on FW 200 Q4h and 1/2NS -> now off  On diet  Encourage po intake  Tobacco abuse  Current smoker  Smoking cessation counseling provided  Pt is willing to quit  Other Stroke Risk Factors  Family hx stroke (mother)  Other Active Problems  Vit B deficiency - on supplement  Elevated Hgb 17.9 ->14.2->14.9  Chronic pain  Hospital day # 4  This patient is critically ill due to bilateral strokes, seizure, encephalopathy, fever,  hypernatremia and at significant risk of neurological worsening, death form recurrent stroke, hemorrhagic conversion, status epilepticus, sepsis. This patient's care requires constant monitoring of vital signs, hemodynamics, respiratory and cardiac  monitoring, review of multiple databases, neurological assessment, discussion with family, other specialists and medical decision making of high complexity. I spent 30 minutes of neurocritical care time in the care of this patient.  I also discussed with CCM PA Peter.  Marvel Plan, MD PhD Stroke Neurology 10/09/2020 3:37 PM   To contact Stroke Continuity provider, please refer to [email or web address]. After hours, contact General Neurology

## 2020-10-09 NOTE — Progress Notes (Signed)
Pt being transported to 3[facility] via [facility].  Report called to nurse, Vernona Rieger, prior to transport.  All questions answered.

## 2020-10-09 NOTE — Progress Notes (Signed)
NAME:  Judith Sullivan, MRN:  [id number], DOB:  26-Dec-1960, LOS: 4 ADMISSION DATE:  10/04/2020, CONSULTATION DATE:  10/09/2020 REFERRING MD:  Dr. Gwenlyn Sullivan CHIEF COMPLAINT: Respiratory distress, CVA  Brief History   59 year old woman with chronic pain admitted 10/27 with chief complaint of confusion for few days.  Home medications included tizanidine, gabapentin and opiates.  MRI showed evidence of bilateral hemispheric infarcts.  Neurology was consulted and recommended TEE and event monitor with dual antiplatelet therapy.  She was noted to be deficient in vitamin B12.  She developed tonic-clonic seizure witnessed by ICU staff 10/28 and transfer to [facility] was planned after consultation with neurology.  Repeat CT showed area of edema along the right occipital parietal region which is new compared to previous MRI.  CT angiogram of the head and neck did not show any acute occlusion.  Swallow evaluation showed high aspiration risk.  Palliative medicine was consulted. She then developed respiratory distress and was placed on BiPAP.  Required intubation prior to transfer to [facility].   Past Medical History  COPD, smoker Type 2 diabetes Chronic pain  Significant Hospital Events   10/26 Admit  10/28 tonic-clonic seizure, tx to [facility] for Neurology evaluation  10/30 awake and cooperative. No seizures. Extubated. Added IV vanc as wbc ct inc and MRSA PCR from nare positive. UA ordered  Consults:  Neurology  Procedures:  ETT 10/28 >>10/30  Significant Diagnostic Tests:  Head CT 10/26 >> negative MRI brain 10/27 >> small acute to subacute infarcts along the bilateral posterior cerebral convexities.  Remote lacunar infarcts in the left corona radiata and bilateral thalamus. ECHO 10/27 >> poor windows, LVEF ~ 60-65%, no RWMA, grade I diastolic dysfunction Carotid US 10/27 >> unable to assess Left. No hemodynamically significant R ICA stenosis, <50% Head CT 10/28 >> moderate area of edema along the right  occipital parietal cortex which has occurred since brain MRI yesterday, presumably interval infarct.  CT angiogram head/neck 10/28 >> high-grade right PCA branch stenosis EEG 10/29 >> severe diffuse non-specific encephalopathy, no seizures  Micro Data:  COVID 10/26 >> negative  Influenza A/B 10/26 >> negative MRSA PCR 10/28 >> positive  Tracheal aspirate 10/28 >>  Antimicrobials:  Unasyn 10/29 >> Vancomycin 10/30 Interim history/subjective:  Extubated yesterday on room air Objective   Blood pressure (Abnormal) 142/70, pulse (Abnormal) 52, temperature 98.2 F (36.8 C), temperature source Oral, resp. rate 20, height 5\' 3"  (1.6 m), weight 45 kg, SpO2 96 %.       Intake/Output Summary (Last 24 hours) at 10/09/2020 0546 Last data filed at 10/09/2020 0500 Gross per 24 hour  Intake 1027.51 ml  Output 1250 ml  Net -222.49 ml   Filed Weights   10/06/20 1828 10/07/20 0400 10/08/20 0500  Weight: 41.9 kg 45.4 kg 45 kg    Examination: General this is a very pleasant 59 year old female she is resting comfortably and in no acute distress HEENT normocephalic atraumatic no jugular venous distention Pulmonary: Clear to auscultation no accessory use Cardiac regular rate and rhythm Abdomen soft nontender Extremities warm dry Neuro awake oriented x3, does have right-sided weakness in the upper extremity noted by small right drift GU clear yellow  Resolved Hospital Problem list     Assessment & Plan:   Acute CVA   MRI with small infarcts in bilateral posterior cerebral convexities / watershed distribution. New edema noted on CT head 10/28 along right occipital parietal cortex, presumed interval infarct.  CTA head/neck with high grad right PCA branch  stenosis Plan Continue secondary stroke prevention with aspirin Plavix and Lipitor  B12 replacement  Rehabilitation focus, has been seen by SLP, will ask physical therapy to see as well Suspect she would be a candidate for inpatient  rehab  Acute Metabolic Encephalopathy; likely post-ictal state s/p Seizure  In setting of new CVA, seizure. Hx of overmedication in past. UDS, Salicylate / tylenol negative.  Plan Was placed on dexamethasone for cerebral vasogenic edema will defer this to stroke team but suspect we can taper soon Supportive care   Acute Respiratory Failure in setting of CVA, Encephalopathy  COPD  Possible aspiration, sputum culture still pending, x-ray was clear, did have positive MRSA PCR Plan Mobilize  Pulse ox  Day four Unasyn, day two vancomycin, narrow as soon as cultures dictate versus completed 5-day course unless MRSA in sputum  Continue bronchodilators  Aspiration/reflux precautions   Hypernatremia -->worse. Free water def 1.9 liters; now taking p.o. intake Plan We'll follow up chemistry this a.m. and again in the morning Oral intake/thirst should regulate sodium naturally  Uncontrolled DM-2 -holding home metformin, januvia   -glycemic control achieved Plan Continue sliding scale insulin as well as Levemir Goal glucose 140-180   Hypertension  Resumed lopressor at reduced dose 10/28 (takes 50 bid), increased to 25 mg twice daily on 10/30 Plan Continue current metoprolol dosing  B12 Deficiency  Plan Continue replacement   Best practice:  Diet: NPO Pain/Anxiety/Delirium protocol (if indicated): PAD protocol stopped 10/30 VAP protocol (if indicated): in place; stopped 10/30 DVT prophylaxis: sq heparin GI prophylaxis: protonix Glucose control: SSI Mobility: Bed rest  Code Status: full code Family Communication: Husband Judith Sullivan) updated via phone 10/29 Disposition: ICU she can move out of the intensive care today  Judith Sullivan ACNP-BC [facility] Pulmonary/Critical Care Pager # [phone] OR # [phone] if no answer

## 2020-10-10 LAB — CULTURE, RESPIRATORY W GRAM STAIN

## 2020-10-10 LAB — GLUCOSE, CAPILLARY
Glucose-Capillary: 100 mg/dL — ABNORMAL HIGH (ref 70–99)
Glucose-Capillary: 120 mg/dL — ABNORMAL HIGH (ref 70–99)
Glucose-Capillary: 243 mg/dL — ABNORMAL HIGH (ref 70–99)
Glucose-Capillary: 246 mg/dL — ABNORMAL HIGH (ref 70–99)

## 2020-10-10 MED ORDER — ACETAMINOPHEN 325 MG PO TABS: 650.0000 mg | ORAL_TABLET | Freq: Four times a day (QID) | ORAL | Status: DC | PRN

## 2020-10-10 MED ORDER — DM-GUAIFENESIN ER 30-600 MG PO TB12: 1.0000 | ORAL_TABLET | Freq: Two times a day (BID) | ORAL | Status: DC | PRN

## 2020-10-10 MED ORDER — SENNOSIDES-DOCUSATE SODIUM 8.6-50 MG PO TABS: 1.0000 | ORAL_TABLET | Freq: Every evening | ORAL | Status: DC | PRN

## 2020-10-10 MED ORDER — MENTHOL 3 MG MT LOZG
1.0000 | LOZENGE | OROMUCOSAL | Status: DC | PRN
  Administered 2020-10-10: 3 mg via ORAL
  Filled 2020-10-10: qty 9

## 2020-10-10 MED ORDER — ADULT MULTIVITAMIN W/MINERALS CH
1.0000 | ORAL_TABLET | Freq: Every day | ORAL | Status: DC
  Administered 2020-10-10 – 2020-10-11 (×2): 1 via ORAL
  Filled 2020-10-10 (×2): qty 1

## 2020-10-10 MED ORDER — ENSURE ENLIVE PO LIQD
237.0000 mL | Freq: Three times a day (TID) | ORAL | Status: DC
  Administered 2020-10-10 – 2020-10-12 (×6): 237 mL via ORAL

## 2020-10-10 MED ORDER — IPRATROPIUM-ALBUTEROL 0.5-2.5 (3) MG/3ML IN SOLN: 3.0000 mL | RESPIRATORY_TRACT | Status: DC | PRN

## 2020-10-10 MED ORDER — DEXTROSE-NACL 5-0.45 % IV SOLN: INTRAVENOUS | Status: AC

## 2020-10-10 MED FILL — Midazolam HCl Inj 5 MG/5ML (Base Equivalent): INTRAMUSCULAR | Qty: 5 | Status: AC

## 2020-10-10 MED FILL — Fentanyl Citrate Preservative Free (PF) Inj 100 MCG/2ML: INTRAMUSCULAR | Qty: 2 | Status: AC

## 2020-10-10 NOTE — Progress Notes (Signed)
STROKE TEAM PROGRESS NOTE   INTERVAL HISTORY Pt PT and husband are at the bedside. Pt sitting at the edge of bed, neuro stable. pending TEE on Wednesday. Still has right wrist drop.    Vitals:   10/10/20 0409 10/10/20 0751 10/10/20 1118 10/10/20 1601  BP: (!) 170/80 (!) 169/72 (!) 170/79 (!) 157/74  Pulse: 72 66 70 80  Resp: 18 18  18   Temp: 98 F (36.7 C) 98.4 F (36.9 C) 98.1 F (36.7 C) 98.3 F (36.8 C)  TempSrc: Oral Oral Oral Oral  SpO2: 96% 97% 92% 93%  Weight:      Height:       CBC:  Recent Labs  Lab 10/04/20 1120 10/04/20 1120 10/05/20 0320 10/06/20 0936 10/08/20 0446 10/09/20 1135  WBC 8.3   < > 8.5   < > 13.7* 16.1*  NEUTROABS 7.5  --  6.9  --   --   --   HGB 16.9*   < > 16.1*   < > 14.2 14.9  HCT 48.7*   < > 47.1*   < > 43.8 44.5  MCV 92.8   < > 94.4   < > 100.0 95.7  PLT 336   < > 327   < > 168 173   < > = values in this interval not displayed.   Basic Metabolic Panel:  Recent Labs  Lab 10/06/20 0936 10/07/20 1816 10/08/20 0446 10/09/20 1135  NA   < >  --  153* 142  K   < >  --  3.9 4.7  CL   < >  --  120* 106  CO2   < >  --  24 27  GLUCOSE   < >  --  188* 178*  BUN   < >  --  36* 23*  CREATININE   < >  --  0.90 0.83  CALCIUM   < >  --  8.6* 9.2  MG  --  2.2 2.0  --   PHOS  --  2.0* 2.6  --    < > = values in this interval not displayed.   Lipid Panel:  Recent Labs  Lab 10/06/20 0616 10/06/20 0616 10/06/20 1651  CHOL 199  --   --   TRIG 225*   < > 211*  HDL 43  --   --   CHOLHDL 4.6  --   --   VLDL 45*  --   --   LDLCALC 111*  --   --    < > = values in this interval not displayed.   HgbA1c:  Recent Labs  Lab 10/04/20 1041  HGBA1C 6.5*   Urine Drug Screen:  Recent Labs  Lab 10/04/20 1106  LABOPIA NONE DETECTED  COCAINSCRNUR NONE DETECTED  LABBENZ NONE DETECTED  AMPHETMU NONE DETECTED  THCU NONE DETECTED  LABBARB NONE DETECTED    Alcohol Level  Recent Labs  Lab 10/04/20 1120  ETH <10    IMAGING past 24 hours No  results found.  PHYSICAL EXAM   Temp:  [98 F (36.7 C)-98.9 F (37.2 C)] 98.3 F (36.8 C) (11/01 1601) Pulse Rate:  [61-80] 80 (11/01 1601) Resp:  [18] 18 (11/01 1601) BP: (152-170)/(72-80) 157/74 (11/01 1601) SpO2:  [90 %-97 %] 93 % (11/01 1601)  General - Well nourished, well developed, in no apparent distress.  Ophthalmologic - fundi not visualized due to noncooperation.  Cardiovascular - Regular rhythm and rate.  Mental Status -  Level  of arousal and orientation to place (hospital), age and month and person were intact. However, not orientated to year. Language including expression, naming, repetition, comprehension was assessed and found intact.  Cranial Nerves II - XII - II - Visual field intact OU. III, IV, VI - Extraocular movements intact. V - Facial sensation intact bilaterally. VII - Facial movement intact bilaterally. VIII - Hearing & vestibular intact bilaterally. X - Palate elevates symmetrically. XI - Chin turning & shoulder shrug intact bilaterally. XII - Tongue protrusion intact.  Motor Strength - The patient's strength was 3+/5 BUE and 3/5 BLEs, and pronator drift was absent. However, chronic right wrist drop present.  Bulk was decreased bilaterally and fasciculations were absent.   Motor Tone - Muscle tone was assessed at the neck and appendages and was normal.  Reflexes - The patient's reflexes were symmetrical in all extremities and she had no pathological reflexes.  Sensory - Light touch, temperature/pinprick were assessed and were symmetrical.    Coordination - The patient had normal movements in the hands with no ataxia or dysmetria.  Tremor was absent.  Gait and Station - deferred.   ASSESSMENT/PLAN Judith Sullivan is a 59 y.o. female with history of chronic pain, morbid obesity, tendency to overmedicate admitted for acute encephalopathy and witnessed seizure found to have stroke and subsequently transferred from OSH to [facility] for higher level of care.   Stroke:   Right posterior MCA, punctate b/l MCA/PCA, right MCA/PCA, right MCA/ACA infarcts secondary unknown source. Needs to rule out endocarditis  CT head 10/26 No acute abnormality.   MRI 10/27 Few small B posterior cerebral convexity infarcts and remote lacunes in L corona radiata and B thalami  CT head 10/28 new R occipitoparietal hypodensity, chronic infarcts  CTA head & neck 10/28 no LVO. Diffuse atherosclerosis throughout. High-grade R PCA branch stenosis   2D Echo EF 60-65%. No source of embolus   TEE 11/3 at 0945 a to rule out endocarditis. If neg, will consider loop recorder to rule out afib, loop can be done on Wed, depending on planned date of d/c.   LE Venous Dopplers - neg  LDL 111  HgbA1c 6.5  UDS - negative  VTE prophylaxis - Lovenox 30 mg sq daily   No antithrombotic prior to admission, now on aspirin 325 mg daily. Avoid DAPT for now, pending to r/o endocarditis.   Therapy recommendations:  SNF  Disposition:  pending   Acute Respiratory Failure Underlying COPD  Intubated 10/28, post Sz  CCM on board  Extubated 10/30  Tolerating well  Seizure  Focal to bilateral tonic clonic seizures - witnessed by staff  EEG 10/28 generalized intermittent slowing, generalized background attenuation  LT EEG 10/06/2020 2055 to 10/30/221 0900:  milddiffuse encephalopathy, nonspecific etiology but likely related to sedation.No seizures ordefiniteepileptiform discharges were seen throughout the record  Keppra was given one time dose 750mg  on 10/28  On keppra 500mg  bid now  Fever and leukocytosis  T Max - 100.4->98.2->100.3->afebrile  Leukocytosis - 8.5->11.8->13.7->16.1 (Decadron started 10/28 by CCM)  Sputum culture - rare MRSA  IV Unasyn 10/29>>  vanco 10/30>>10/31  Blood culture - 10/30 -  NGTD   Hypertensive Emergency  BP as high as 228/191  Stable now  . Long-term BP goal normotensive . Avoid low BP  due to high-grade R PCA branch stenosis   Hyperlipidemia  Home meds:  No statin  LDL 111, goal < 70  Now on lipitor 40  Continue  statin at discharge  Diabetes type II, controlled  HgbA1c 6.5, goal < 7.0  CBGs  SSI  PCP follow up  Dysphagia . Secondary to stroke . Passed swallow post extubation . On diet now . Speech on board  R wrist drop  Per daughter, it has been several weeks  Has appointment with Dr. Allena Katz on 11/21/20  Needs outpt EMG/NCS with Dr. Allena Katz  Defer to PT for wrist brace  Continue PT/OT  Hypernatremia, resolved    Na 142  Was on FW 200 Q4h and 1/2NS -> now off  On diet  Encourage po intake  Tobacco abuse  Current smoker  Smoking cessation counseling provided  Pt is willing to quit  Other Stroke Risk Factors  Family hx stroke (mother)  Other Active Problems  Vit B deficiency - on supplement  Elevated Hgb 17.9 ->14.2->14.9 - resolved  Chronic pain  Hospital day # 5   Marvel Plan, MD PhD Stroke Neurology 10/10/2020 4:38 PM   To contact Stroke Continuity provider, please refer to [email or web address]. After hours, contact General Neurology

## 2020-10-10 NOTE — Progress Notes (Signed)
  Speech Language Pathology Treatment: Cognitive-Linquistic  Patient Details Name: ANISHA STARLIPER MRN: [id number] DOB: [birth year] Today's Date: 10/10/2020 Time: 7619-5093 SLP Time Calculation (min) (ACUTE ONLY): 25 min  Assessment / Plan / Recommendation Clinical Impression  Pt pleasant, interactive.  Speech is clear and fluent with persisting deficits in word-retrieval noted at conversational levels.  Engaged in divergent naming tasks, using categories of info to help with self-cueing. Required prompts for retrieval beyond a list of three. Demonstrated improved awareness, orientation to circumstances, and response time today.  Min verbal cues required for storage/recall of unrelated objects.  Pt will continue to benefit from acute speech services.   HPI HPI: 59 year old woman presenting with confusion and found to have bilateral small acute cerebral infarcts with a watershed distribution, encephalopathic, then developed seizure and respiratory distress requiring BiPAP with suspicion for aspiration. Pt had witnessed seizure, transferred to [facility]. Intubated 10/06/09/30. PMH; COPD, HTN, asthma with bronchitis. CXR No new consolidation or edema. No pleural effusion. No pneumothorax. BSE 10/27 at [facility] rec NPO (no swallow initiated).      SLP Plan  Continue with current plan of care       Recommendations   ongoing tx for cognition/language                Oral Care Recommendations: Oral care BID Follow up Recommendations: Outpatient SLP SLP Visit Diagnosis: Cognitive communication deficit (O67.124) Plan: Continue with current plan of care       GO                Blenda Mounts Laurice 10/10/2020, 12:59 PM  Bryleigh Ottaway L. Samson Frederic, MA CCC/SLP Acute Rehabilitation Services Office number [phone] Pager [phone]

## 2020-10-10 NOTE — Progress Notes (Signed)
Physical Therapy Treatment Patient Details Name: Judith Sullivan MRN: [id number] DOB: Apr 03, 1961 Today's Date: 10/10/2020    History of Present Illness This 59 y.o. female originally admitted to [facility] with vomiting and AMS secondary to possible overdose of medications as she frequently takes too much medication per spouse's report to ED provider.  MRI of brain 10/27 showed small acute to subacute infarcts along the bilateral posterior cerebral convexities, remote lacunar infarcts in the left corona radiata and bil. thalamus.  On 10/28 she was noted to have tonic/clonic seizures and was transferred to [facility] for higher level of care. On arrival at [facility], she was in respiratory distress possibly due to aspiration and was placed on BiPAP then intubated.  EEG LTM performed.  CT of head on 10/28 showed moderate area of edema along the Rt occipital parietal cortex which is presumably an infarct, Extubated 10/30.  PMH includes:  HTN, CTS, COPD, asthma with bronchitis, s/p shoulder surgery, s/p neck fusion    PT Comments    Pt limited by nausea this date. Was planning to perform gait training with a chair follow, but upon coming to stand pt quickly transferred and returned to sit. The nausea repeated each time upon coming to stand. BP readings were 153/86 in sitting, 159/92 immediately following standing bout, and 162/80 upon returning to supine. She had been reporting not eating much due to fear of having more diarrhea and due to throat pain from being intubated, thus nurse notified. Will continue to follow acutely and recommend SNF to address her mentioned deficits to maximize her independence and safety with all functional mobility.    Follow Up Recommendations  SNF;Supervision for mobility/OOB     Equipment Recommendations  Other (comment) (TBD)    Recommendations for Other Services       Precautions / Restrictions Precautions Precautions: Fall;Other (comment) Precaution Comments: watch  02 Restrictions Weight Bearing Restrictions: No    Mobility  Bed Mobility Overal bed mobility: Needs Assistance Bed Mobility: Rolling;Sidelying to Sit;Sit to Supine Rolling: Supervision Sidelying to sit: Min guard   Sit to supine: Min guard   General bed mobility comments: Utilized bed rail, but able to transition to sit and to supine with increased time but min guard for safety.  Transfers Overall transfer level: Needs assistance Equipment used: None Transfers: Sit to/from [employer] Sit to Stand: Min guard Stand pivot transfers: Min guard       General transfer comment: Able to come to stand in appropriate time frame, but reports nausea and quickly transfers or returns to sit. Cues provided to maintain safety. Slight trunk sway noted upon coming to stand.  Ambulation/Gait Ambulation/Gait assistance: Min guard Gait Distance (Feet): 4 Feet Assistive device: None Gait Pattern/deviations: Step-through pattern;Decreased stride length Gait velocity: decreased Gait velocity interpretation: 1.31 - 2.62 ft/sec, indicative of limited community ambulator General Gait Details: Side stepping EOB <> bedside chair with slight unsteadiness noted but no overt LOB.   Stairs             Wheelchair Mobility    Modified Rankin (Stroke Patients Only)       Balance Overall balance assessment: Needs assistance Sitting-balance support: Feet supported;No upper extremity supported Sitting balance-Leahy Scale: Fair Sitting balance - Comments: supervision for safety   Standing balance support: During functional activity Standing balance-Leahy Scale: Fair Standing balance comment: Able to perform functional mobility short distances without overt LOB but unsteadiness noted without UE support.  Cognition Arousal/Alertness: Awake/alert Behavior During Therapy: WFL for tasks assessed/performed Overall Cognitive Status:  Impaired/Different from baseline Area of Impairment: Safety/judgement;Following commands                       Following Commands: Follows one step commands with increased time Safety/Judgement: Decreased awareness of safety;Decreased awareness of deficits     General Comments: Pt requires extra time and cues to perform all tasks.      Exercises      General Comments General comments (skin integrity, edema, etc.): SpO2 ranging from 92-96% during session; BP 153/86 sitting, 159/92 immediately following standing bout, 162/80 at end of session supine in bed      Pertinent Vitals/Pain Pain Assessment: No/denies pain Pain Intervention(s): Monitored during session    Home Living                      Prior Function            PT Goals (current goals can now be found in the care plan section) Acute Rehab PT Goals Patient Stated Goal: to be able to walk further and go home PT Goal Formulation: With patient/family Time For Goal Achievement: 10/19/20 Potential to Achieve Goals: Good Progress towards PT goals: Progressing toward goals    Frequency    Min 4X/week      PT Plan Current plan remains appropriate    Co-evaluation              AM-PAC PT "6 Clicks" Mobility   Outcome Measure  Help needed turning from your back to your side while in a flat bed without using bedrails?: A Little Help needed moving from lying on your back to sitting on the side of a flat bed without using bedrails?: A Little Help needed moving to and from a bed to a chair (including a wheelchair)?: A Little Help needed standing up from a chair using your arms (e.g., wheelchair or bedside chair)?: A Little Help needed to walk in hospital room?: A Little Help needed climbing 3-5 steps with a railing? : A Lot 6 Click Score: 17    End of Session Equipment Utilized During Treatment: Gait belt Activity Tolerance: Patient tolerated treatment well;Other (comment) (limited by  nausea) Patient left: in bed;with call bell/phone within reach;with bed alarm set;with family/visitor present Nurse Communication: Other (comment) (pt reports of diarrhea and throat pain affecting her eating) PT Visit Diagnosis: Unsteadiness on feet (R26.81);Other abnormalities of gait and mobility (R26.89);Muscle weakness (generalized) (M62.81);Difficulty in walking, not elsewhere classified (R26.2)     Time: 1400-1436 PT Time Calculation (min) (ACUTE ONLY): 36 min  Charges:  $Therapeutic Activity: 23-37 mins                     Raymond Gurney, PT, DPT Acute Rehabilitation Services  Pager: [phone] Office: [phone]    Jewel Baize 10/10/2020, 4:20 PM

## 2020-10-10 NOTE — Progress Notes (Signed)
Nutrition Follow-up  DOCUMENTATION CODES:   Underweight  INTERVENTION:  -Recommend liberalizing diet to regular to provide more kcals/protein and to encourage po intake -Ensure Enlive po TID, each supplement provides 350 kcal and 20 grams of protein -Magic cup TID with meals, each supplement provides 290 kcal and 9 grams of protein -MVI daily  NUTRITION DIAGNOSIS:   Inadequate oral intake related to lethargy/confusion as evidenced by per patient/family report (as per HPI).  ongoing  GOAL:   Patient will meet greater than or equal to 90% of their needs  Not met  MONITOR:   Labs, I & O's, Supplement acceptance, PO intake, Weight trends  REASON FOR ASSESSMENT:   Malnutrition Screening Tool    ASSESSMENT:   Pt with PMH of COPD, DM2, chronic pain, HTN, and asthma admitted with CVA and seizure with encephalopathy.   10/26 Admitted to [facility] 10/28 Intubated, tonic clonic seizure, transferred to [facility] 10/30 Extubated; MRSA PCR positive  Pt continues to receive B12 replacement. Possible TEE Wednesday. Pt receiving carb modified diet since being extubated with 25-35% meal completion x3 recorded meals. Will order oral nutrition supplements to provide pt with additional kcals/protein. Recommend liberalizing diet to encourage po intake and to provide more kcals/protein. Unable to perform nutrition-focused physical exam at this time as RD is working remotely; will obtain at follow-up.  Admit wt: 45.4 kg Current wt: 45 kg  Labs: CBGs 100-120 Medications: vitamin D3, vitamin B12, decadron, ss novolog, 5 units levemir daily IVF: D5-1/2 NS @ 43m/hr  Diet Order:   Diet Order            Diet Carb Modified Fluid consistency: Thin; Room service appropriate? Yes with Assist  Diet effective now                 EDUCATION NEEDS:   Not appropriate for education at this time  Skin:  Skin Assessment: Reviewed RN Assessment  Last BM:  10/31  Height:   Ht Readings  from Last 1 Encounters:  10/06/20 5' 3"  (1.6 m)    Weight:   Wt Readings from Last 1 Encounters:  10/08/20 45 kg    Ideal Body Weight:  45.5 kg  BMI:  Body mass index is 17.57 kg/m.  Estimated Nutritional Needs:   Kcal:  1500-1700  Protein:  75-85 grams  Fluid:  >/=1.5L/d    ALarkin Ina MS, RD, LDN RD pager number and weekend/on-call pager number located in [phone]

## 2020-10-10 NOTE — Progress Notes (Signed)
PROGRESS NOTE    Judith Sullivan  HQI:[id number] DOB: 11-Oct-1961 DOA: 10/04/2020 PCP: Glenda Chroman, MD   Brief Narrative:  59 year old with history of COPD, DM2, chronic pain has been having ongoing confusion prior to admission for several days admitted initially to ICU with CVA, seizure with encephalopathy started on Keppra.  MRI showed by lateral hemispheric infarct, neurology was consulted.  Tonic-clonic seizure was witnessed by ICU staff and transferred to [facility].  CT of the head showed edema along right occipital parietal region, CTA head and neck did not show any large vessel occlusion.  Speech and swallow study-high risk of aspiration.  Due to worsening of respiratory status initially placed on BiPAP but thereafter had to be intubated on 10/28 and extubated 10/30.  Echocardiogram-EF 60 to 65%, grade 1 DD, carotid ultrasound-right-sided ICA stenosis less than 50%, EEG severe diffuse encephalopathy.  Initially started on vancomycin thereafter switched to Unasyn.   Assessment & Plan:   Active Problems:   Metabolic encephalopathy   AMS (altered mental status)   Cerebrovascular accident (CVA) ([place])   Seizures ([place])   Palliative care by specialist   Goals of care, counseling/discussion   Acute encephalopathy  Acute bilateral posterior cerebral infarcts -CTA of the head and neck negative for large vessel occlusion but shows high-grade right-sided PCA stenosis -Echocardiogram 60 to 65%, grade 1 DD -Carotid ultrasound-left side difficult to assess, right-sided ICA stenosis less than 50% -Neurology following-TEE planned for Wednesday, Loop recorder plans per TEE results.  -Antiplatelet therapy-aspirin 325 mg daily, avoid DAPT for now per neuro pending to rule out endocarditis -Statin-Lipitor 40 mg -LDL 111, A1c 6.5 -UDS-negative -PT/OT-SNF  Tonic-clonic seizures with acute metabolic encephalopathy -EEG showing severe diffuse encephalopathy -Antiepileptic-Keppra 500 mg  twice daily -Neurology following  Acute hypoxic respiratory failure secondary to probable aspiration pneumonia -Initially intubated 10/28, extubated 10/30 -Speech and swallow- -Currently on empiric Unasyn, plan to complete 5 to 7-day course -As needed bronchodilators -Aspiration precautions  Hypernatremia -Improved.  Closely monitor for now.  Diabetes mellitus type 2 -Home Metformin and Januvia on hold -Sliding scale Accu-Chek -Levemir 5 units at bedtime -D5 half-normal saline while n.p.o.  Essential hypertension -Permissive hypertension for now.  Right wrist drop -Outpatient follow-up for EMG with Dr. Posey Pronto -Wrist brace per PT  B12 deficiency -Continue replacement therapy     DVT prophylaxis: Lovenox Code Status: Full code Family Communication:    Status is: Inpatient  Remains inpatient appropriate because:Inpatient level of care appropriate due to severity of illness   Dispo: The patient is from: Home              Anticipated d/c is to: SNF              Anticipated d/c date is: > 3 days              Patient currently is not medically stable to d/c. On going evaluation of endocarditis. TEE planned for Wednesday        Body mass index is 17.57 kg/m.     Subjective: Feels ok, no complaints overnight.   Review of Systems Otherwise negative except as per HPI, including: General: Denies fever, chills, night sweats or unintended weight loss. Resp: Denies cough, wheezing, shortness of breath. Cardiac: Denies chest pain, palpitations, orthopnea, paroxysmal nocturnal dyspnea. GI: Denies abdominal pain, nausea, vomiting, diarrhea or constipation GU: Denies dysuria, frequency, hesitancy or incontinence MS: Denies muscle aches, joint pain or swelling Neuro: Denies headache, neurologic deficits (focal weakness, numbness,  tingling), abnormal gait Psych: Denies anxiety, depression, SI/HI/AVH Skin: Denies new rashes or lesions ID: Denies sick contacts, exotic  exposures, travel  Examination:  General exam: Appears calm and comfortable  Respiratory system: Clear to auscultation. Respiratory effort normal. Cardiovascular system: S1 & S2 heard, RRR. No JVD, murmurs, rubs, gallops or clicks. No pedal edema. Gastrointestinal system: Abdomen is nondistended, soft and nontender. No organomegaly or masses felt. Normal bowel sounds heard. Central nervous system: Alert and oriented. No focal neurological deficits. Extremities: Symmetric 5 x 5 power. Skin: No rashes, lesions or ulcers Psychiatry: Judgement and insight appear normal. Mood & affect appropriate.     Objective: Vitals:   10/09/20 1631 10/09/20 1955 10/10/20 0002 10/10/20 0409  BP: (!) 155/72 (!) 152/73 (!) 164/75 (!) 170/80  Pulse: 70 70 61 72  Resp: 20 18 18 18   Temp: 98.2 F (36.8 C) 98.9 F (37.2 C) 98.5 F (36.9 C) 98 F (36.7 C)  TempSrc: Oral Oral Oral Oral  SpO2: 96% 90% 92% 96%  Weight:      Height:        Intake/Output Summary (Last 24 hours) at 10/10/2020 0552 Last data filed at 10/09/2020 1400 Gross per 24 hour  Intake 240 ml  Output 250 ml  Net -10 ml   Filed Weights   10/06/20 1828 10/07/20 0400 10/08/20 0500  Weight: 41.9 kg 45.4 kg 45 kg     Data Reviewed:   CBC: Recent Labs  Lab 10/04/20 1120 10/05/20 0320 10/06/20 0936 10/08/20 0446 10/09/20 1135  WBC 8.3 8.5 11.8* 13.7* 16.1*  NEUTROABS 7.5 6.9  --   --   --   HGB 16.9* 16.1* 17.9* 14.2 14.9  HCT 48.7* 47.1* 52.3* 43.8 44.5  MCV 92.8 94.4 96.3 100.0 95.7  PLT 336 327 297 168 836   Basic Metabolic Panel: Recent Labs  Lab 10/04/20 1120 10/05/20 0320 10/06/20 0936 10/07/20 1452 10/07/20 1816 10/08/20 0446 10/09/20 1135  NA 140 144 151*  --   --  153* 142  K 3.7 3.3* 3.6  --   --  3.9 4.7  CL 100 107 109  --   --  120* 106  CO2 23 23 25   --   --  24 27  GLUCOSE 201* 155* 159*  --   --  188* 178*  BUN 37* 38* 43*  --   --  36* 23*  CREATININE 0.93 0.81 0.88  --   --  0.90 0.83    CALCIUM 10.0 9.6 10.0  --   --  8.6* 9.2  MG  --  2.3  --  2.3 2.2 2.0  --   PHOS  --   --   --  2.2* 2.0* 2.6  --    GFR: Estimated Creatinine Clearance: 52.5 mL/min (by C-G formula based on SCr of 0.83 mg/dL). Liver Function Tests: Recent Labs  Lab 10/04/20 1120 10/05/20 0320 10/06/20 0936 10/09/20 1135  AST 46* 20 15 19   ALT 92* 61* 43 20  ALKPHOS 56 49 47 37*  BILITOT 1.3* 1.4* 1.5* 0.8  PROT 7.7 6.9 7.0 5.5*  ALBUMIN 4.4 4.0 4.2 3.4*   No results for input(s): LIPASE, AMYLASE in the last 168 hours. Recent Labs  Lab 10/04/20 1120  AMMONIA 9   Coagulation Profile: No results for input(s): INR, PROTIME in the last 168 hours. Cardiac Enzymes: Recent Labs  Lab 10/04/20 1120  CKTOTAL 961*   BNP (last 3 results) No results for input(s): PROBNP in the  last 8760 hours. HbA1C: No results for input(s): HGBA1C in the last 72 hours. CBG: Recent Labs  Lab 10/09/20 0354 10/09/20 0758 10/09/20 1206 10/09/20 1629 10/09/20 1954  GLUCAP 145* 127* 170* 155* 163*   Lipid Profile: No results for input(s): CHOL, HDL, LDLCALC, TRIG, CHOLHDL, LDLDIRECT in the last 72 hours. Thyroid Function Tests: No results for input(s): TSH, T4TOTAL, FREET4, T3FREE, THYROIDAB in the last 72 hours. Anemia Panel: No results for input(s): VITAMINB12, FOLATE, FERRITIN, TIBC, IRON, RETICCTPCT in the last 72 hours. Sepsis Labs: Recent Labs  Lab 10/04/20 1120  LATICACIDVEN 1.4    Recent Results (from the past 240 hour(s))  Respiratory Panel by RT PCR (Flu A&B, Covid) - Nasopharyngeal Swab     Status: None   Collection Time: 10/04/20 11:46 AM   Specimen: Nasopharyngeal Swab  Result Value Ref Range Status   SARS Coronavirus 2 by RT PCR NEGATIVE NEGATIVE Final    Comment: (NOTE) SARS-CoV-2 target nucleic acids are NOT DETECTED.  The SARS-CoV-2 RNA is generally detectable in upper respiratoy specimens during the acute phase of infection. The lowest concentration of SARS-CoV-2 viral copies  this assay can detect is 131 copies/mL. A negative result does not preclude SARS-Cov-2 infection and should not be used as the sole basis for treatment or other patient management decisions. A negative result may occur with  improper specimen collection/handling, submission of specimen other than nasopharyngeal swab, presence of viral mutation(s) within the areas targeted by this assay, and inadequate number of viral copies (<131 copies/mL). A negative result must be combined with clinical observations, patient history, and epidemiological information. The expected result is Negative.  Fact Sheet for Patients:  PinkCheek.be  Fact Sheet for Healthcare Providers:  GravelBags.it  This test is no t yet approved or cleared by the [place] FDA and  has been authorized for detection and/or diagnosis of SARS-CoV-2 by FDA under an Emergency Use Authorization (EUA). This EUA will remain  in effect (meaning this test can be used) for the duration of the COVID-19 declaration under Section 564(b)(1) of the Act, 21 U.S.C. section 360bbb-3(b)(1), unless the authorization is terminated or revoked sooner.     Influenza A by PCR NEGATIVE NEGATIVE Final   Influenza B by PCR NEGATIVE NEGATIVE Final    Comment: (NOTE) The Xpert Xpress SARS-CoV-2/FLU/RSV assay is intended as an aid in  the diagnosis of influenza from Nasopharyngeal swab specimens and  should not be used as a sole basis for treatment. Nasal washings and  aspirates are unacceptable for Xpert Xpress SARS-CoV-2/FLU/RSV  testing.  Fact Sheet for Patients: PinkCheek.be  Fact Sheet for Healthcare Providers: GravelBags.it  This test is not yet approved or cleared by the [place] FDA and  has been authorized for detection and/or diagnosis of SARS-CoV-2 by  FDA under an Emergency Use Authorization (EUA). This EUA  will remain  in effect (meaning this test can be used) for the duration of the  Covid-19 declaration under Section 564(b)(1) of the Act, 21  U.S.C. section 360bbb-3(b)(1), unless the authorization is  terminated or revoked. Performed at [facility], [address] [place],  [address]   MRSA PCR Screening     Status: Abnormal   Collection Time: 10/06/20  9:59 AM   Specimen: Nasal Mucosa; Nasopharyngeal  Result Value Ref Range Status   MRSA by PCR POSITIVE (A) NEGATIVE Final    Comment:        The GeneXpert MRSA Assay (FDA approved for NASAL specimens only), is one component  of a comprehensive MRSA colonization surveillance program. It is not intended to diagnose MRSA infection nor to guide or monitor treatment for MRSA infections. RESULT CALLED TO, READ BACK BY AND VERIFIED WITH: C RUECKEL,RN @0457  10/07/20 [facility] Performed at [facility], [address], [place], [place] [address]   Culture, respiratory (non-expectorated)     Status: None (Preliminary result)   Collection Time: 10/06/20  4:29 PM   Specimen: Tracheal Aspirate; Respiratory  Result Value Ref Range Status   Specimen Description   Final    TRACHEAL ASPIRATE Performed at [facility], [address], [place], [place] [address]    Special Requests   Final    NONE Performed at [facility], [address], [place], [place] [address]    Gram Stain   Final    ABUNDANT WBC PRESENT, PREDOMINANTLY PMN MODERATE GRAM POSITIVE COCCI RARE GRAM NEGATIVE RODS    Culture   Final    RARE STAPHYLOCOCCUS AUREUS SUSCEPTIBILITIES TO FOLLOW Performed at [facility], [place] [address], [place], [place] [address]    Report Status PENDING  Incomplete  Culture, blood (Routine X 2) w Reflex to ID Panel     Status: None (Preliminary result)   Collection Time: 10/08/20 11:27 AM   Specimen: BLOOD RIGHT HAND  Result Value Ref Range Status   Specimen Description BLOOD RIGHT HAND  Final   Special Requests   Final     BOTTLES DRAWN AEROBIC AND ANAEROBIC Blood Culture adequate volume   Culture   Final    NO GROWTH < 24 HOURS Performed at [facility], [place] [address] [place], [place] [address]    Report Status PENDING  Incomplete  Culture, blood (Routine X 2) w Reflex to ID Panel     Status: None (Preliminary result)   Collection Time: 10/08/20 11:36 AM   Specimen: BLOOD LEFT HAND  Result Value Ref Range Status   Specimen Description BLOOD LEFT HAND  Final   Special Requests   Final    BOTTLES DRAWN AEROBIC AND ANAEROBIC Blood Culture adequate volume   Culture   Final    NO GROWTH < 24 HOURS Performed at [facility], [place] [address] [place], [place] [address]    Report Status PENDING  Incomplete         Radiology Studies: VAS [place] LOWER EXTREMITY VENOUS (DVT)  Result Date: 10/08/2020  Lower Venous DVT Study Indications: Stroke.  Comparison Study: No prior study Performing Technologist: Sharion Dove RVS  Examination Guidelines: A complete evaluation includes B-mode imaging, spectral Doppler, color Doppler, and power Doppler as needed of all accessible portions of each vessel. Bilateral testing is considered an integral part of a complete examination. Limited examinations for reoccurring indications may be performed as noted. The reflux portion of the exam is performed with the patient in reverse Trendelenburg.  +---------+---------------+---------+-----------+----------+-------------------+ RIGHT    CompressibilityPhasicitySpontaneityPropertiesThrombus Aging      +---------+---------------+---------+-----------+----------+-------------------+ CFV      Full                                         pulsatile waveforms  noted               +---------+---------------+---------+-----------+----------+-------------------+ SFJ      Full                                                              +---------+---------------+---------+-----------+----------+-------------------+ FV Prox  Full                                                             +---------+---------------+---------+-----------+----------+-------------------+ FV Mid   Full                                                             +---------+---------------+---------+-----------+----------+-------------------+ FV DistalFull                                                             +---------+---------------+---------+-----------+----------+-------------------+ PFV      Full                                                             +---------+---------------+---------+-----------+----------+-------------------+ POP      Full                                         pulsatile waveforms                                                       noted               +---------+---------------+---------+-----------+----------+-------------------+ PTV      Full                                                             +---------+---------------+---------+-----------+----------+-------------------+ PERO     Full                                                             +---------+---------------+---------+-----------+----------+-------------------+   +---------+---------------+---------+-----------+----------+-------------------+  LEFT     CompressibilityPhasicitySpontaneityPropertiesThrombus Aging      +---------+---------------+---------+-----------+----------+-------------------+ CFV      Full                                         pulsatile waveforms                                                       noted               +---------+---------------+---------+-----------+----------+-------------------+ SFJ      Full                                                             +---------+---------------+---------+-----------+----------+-------------------+ FV  Prox  Full                                                             +---------+---------------+---------+-----------+----------+-------------------+ FV Mid   Full                                                             +---------+---------------+---------+-----------+----------+-------------------+ FV DistalFull                                                             +---------+---------------+---------+-----------+----------+-------------------+ PFV      Full                                                             +---------+---------------+---------+-----------+----------+-------------------+ POP      Full                                         pulsatile waveforms                                                       noted               +---------+---------------+---------+-----------+----------+-------------------+ PTV      Full                                                             +---------+---------------+---------+-----------+----------+-------------------+  PERO     Full                                                             +---------+---------------+---------+-----------+----------+-------------------+    Summary: BILATERAL: - No evidence of deep vein thrombosis seen in the lower extremities, bilaterally. - RIGHT: pulsatile waveforms noted  LEFT: Pulsatile waveforms noted.  *See table(s) above for measurements and observations. Electronically signed by Deitra Mayo MD on 10/08/2020 at 1:51:35 PM.    Final         Scheduled Meds: . aspirin  300 mg Rectal Daily   Or  . aspirin  325 mg Oral Daily  . atorvastatin  40 mg Oral Daily  . chlorhexidine gluconate (MEDLINE KIT)  15 mL Mouth Rinse BID  . Chlorhexidine Gluconate Cloth  6 each Topical Q0600  . cholecalciferol  1,000 Units Oral Daily  . cyanocobalamin  1,000 mcg Intramuscular Daily  . dexamethasone (DECADRON) injection  4 mg Intravenous Q8H  .  enoxaparin (LOVENOX) injection  30 mg Subcutaneous Q24H  . insulin aspart  0-15 Units Subcutaneous TID AC & HS  . insulin detemir  5 Units Subcutaneous QHS  . levETIRAcetam  500 mg Oral BID  . loratadine  10 mg Oral Daily  . metoprolol tartrate  25 mg Oral BID  . mupirocin ointment  1 application Nasal BID   Continuous Infusions: . ampicillin-sulbactam (UNASYN) IV 3 g (10/10/20 0436)  . vancomycin 1,000 mg (10/09/20 1621)     LOS: 5 days   Time spent=35 mins    Inas Avena Arsenio Loader, MD [facility]  If 7PM-7AM, please contact night-coverage  10/10/2020, 5:52 AM

## 2020-10-11 LAB — CBC
HCT: 41.5 % (ref 36.0–46.0)
Hemoglobin: 14.6 g/dL (ref 12.0–15.0)
MCH: 32.3 pg (ref 26.0–34.0)
MCHC: 35.2 g/dL (ref 30.0–36.0)
MCV: 91.8 fL (ref 80.0–100.0)
Platelets: 169 10*3/uL (ref 150–400)
RBC: 4.52 MIL/uL (ref 3.87–5.11)
RDW: 11.9 % (ref 11.5–15.5)
WBC: 11.9 10*3/uL — ABNORMAL HIGH (ref 4.0–10.5)
nRBC: 0 % (ref 0.0–0.2)

## 2020-10-11 LAB — BASIC METABOLIC PANEL
Anion gap: 10 (ref 5–15)
BUN: 18 mg/dL (ref 6–20)
CO2: 23 mmol/L (ref 22–32)
Calcium: 8.3 mg/dL — ABNORMAL LOW (ref 8.9–10.3)
Chloride: 104 mmol/L (ref 98–111)
Creatinine, Ser: 0.68 mg/dL (ref 0.44–1.00)
GFR, Estimated: >60 mL/min (ref >60)
Glucose, Bld: 179 mg/dL — ABNORMAL HIGH (ref 70–99)
Potassium: 3.7 mmol/L (ref 3.5–5.1)
Sodium: 137 mmol/L (ref 135–145)

## 2020-10-11 LAB — GLUCOSE, CAPILLARY
Glucose-Capillary: 161 mg/dL — ABNORMAL HIGH (ref 70–99)
Glucose-Capillary: 231 mg/dL — ABNORMAL HIGH (ref 70–99)
Glucose-Capillary: 211 mg/dL — ABNORMAL HIGH (ref 70–99)
Glucose-Capillary: 134 mg/dL — ABNORMAL HIGH (ref 70–99)

## 2020-10-11 LAB — MAGNESIUM: Magnesium: 1.9 mg/dL (ref 1.7–2.4)

## 2020-10-11 MED ORDER — INSULIN DETEMIR 100 UNIT/ML ~~LOC~~ SOLN
8.0000 [IU] | Freq: Every day | SUBCUTANEOUS | Status: DC
  Administered 2020-10-11: 8 [IU] via SUBCUTANEOUS
  Filled 2020-10-11 (×2): qty 0.08

## 2020-10-11 MED ORDER — POTASSIUM CHLORIDE CRYS ER 20 MEQ PO TBCR
40.0000 meq | EXTENDED_RELEASE_TABLET | Freq: Once | ORAL | Status: AC
  Administered 2020-10-11: 40 meq via ORAL
  Filled 2020-10-11: qty 2

## 2020-10-11 MED ORDER — SODIUM CHLORIDE 0.9 % IV SOLN: INTRAVENOUS | Status: DC

## 2020-10-11 MED ORDER — DEXTROSE-NACL 5-0.45 % IV SOLN: INTRAVENOUS | Status: DC

## 2020-10-11 MED ORDER — DEXAMETHASONE SODIUM PHOSPHATE 4 MG/ML IJ SOLN
4.0000 mg | Freq: Two times a day (BID) | INTRAMUSCULAR | Status: DC
  Administered 2020-10-11 – 2020-10-12 (×2): 4 mg via INTRAVENOUS
  Filled 2020-10-11 (×2): qty 1

## 2020-10-11 NOTE — Progress Notes (Signed)
STROKE TEAM PROGRESS NOTE   INTERVAL HISTORY PT at bedside.  Patient sitting at edge of the bed, awake alert, no acute change overnight.  Still has right hand wrist drop, ordered wrist splint.  Discussed with Dr. Posey Sullivan from [facility] neurology, will check ESR, CRP, ANCA, cryoglobulin, hepatitis panel and ANA for vasculitis work-up for the right wrist drop.  Vitals:   10/10/20 2336 10/11/20 0413 10/11/20 0751 10/11/20 1306  BP: (!) 163/92 (!) 159/83 (!) 158/82 128/64  Pulse: 62 73 70 78  Resp: 16 18 16 16   Temp: 97.9 F (36.6 C) 98.2 F (36.8 C) 98.7 F (37.1 C) 98.7 F (37.1 C)  TempSrc: Oral Oral Oral Oral  SpO2: 97% 97% 92% 95%  Weight:      Height:       CBC:  Recent Labs  Lab 10/05/20 0320 10/06/20 0936 10/09/20 1135 10/11/20 0443  WBC 8.5   < > 16.1* 11.9*  NEUTROABS 6.9  --   --   --   HGB 16.1*   < > 14.9 14.6  HCT 47.1*   < > 44.5 41.5  MCV 94.4   < > 95.7 91.8  PLT 327   < > 173 169   < > = values in this interval not displayed.   Basic Metabolic Panel:  Recent Labs  Lab 10/06/20 0936 10/07/20 1816 10/08/20 0446 10/08/20 0446 10/09/20 1135 10/11/20 0443  NA   < >  --  153*   < > 142 137  K   < >  --  3.9   < > 4.7 3.7  CL   < >  --  120*   < > 106 104  CO2   < >  --  24   < > 27 23  GLUCOSE   < >  --  188*   < > 178* 179*  BUN   < >  --  36*   < > 23* 18  CREATININE   < >  --  0.90   < > 0.83 0.68  CALCIUM   < >  --  8.6*   < > 9.2 8.3*  MG   < > 2.2 2.0  --   --  1.9  PHOS  --  2.0* 2.6  --   --   --    < > = values in this interval not displayed.   Lipid Panel:  Recent Labs  Lab 10/06/20 0616 10/06/20 0616 10/06/20 1651  CHOL 199  --   --   TRIG 225*   < > 211*  HDL 43  --   --   CHOLHDL 4.6  --   --   VLDL 45*  --   --   LDLCALC 111*  --   --    < > = values in this interval not displayed.    IMAGING past 24 hours No results found.   PHYSICAL EXAM   Temp:  [97.9 F (36.6 C)-98.7 F (37.1 C)] 98.7 F (37.1 C) (11/02 1306) Pulse  Rate:  [62-90] 78 (11/02 1306) Resp:  [16-18] 16 (11/02 1306) BP: (128-163)/(64-92) 128/64 (11/02 1306) SpO2:  [92 %-97 %] 95 % (11/02 1306)  General - Well nourished, well developed, in no apparent distress.  Ophthalmologic - fundi not visualized due to noncooperation.  Cardiovascular - Regular rhythm and rate.  Mental Status -  Level of arousal and orientation to place (hospital), age and month and person were intact. However, not orientated  to year. Language including expression, naming, repetition, comprehension was assessed and found intact.  Cranial Nerves II - XII - II - Visual field intact OU. III, IV, VI - Extraocular movements intact. V - Facial sensation intact bilaterally. VII - Facial movement intact bilaterally. VIII - Hearing & vestibular intact bilaterally. X - Palate elevates symmetrically. XI - Chin turning & shoulder shrug intact bilaterally. XII - Tongue protrusion intact.  Motor Strength - The patient's strength was 3+/5 BUE and 3/5 BLEs, and pronator drift was absent. However, chronic right wrist drop present.  Bulk was decreased bilaterally and fasciculations were absent.   Motor Tone - Muscle tone was assessed at the neck and appendages and was normal.  Reflexes - The patient's reflexes were symmetrical in all extremities and she had no pathological reflexes.  Sensory - Light touch, temperature/pinprick were assessed and were symmetrical.    Coordination - The patient had normal movements in the hands with no ataxia or dysmetria.  Tremor was absent.  Gait and Station - deferred.   ASSESSMENT/PLAN Ms. Judith Sullivan is a 59 y.o. female with history of chronic pain, morbid obesity, tendency to overmedicate admitted for acute encephalopathy and witnessed seizure found to have stroke and subsequently transferred from OSH to [place]. [facility] for higher level of care.   Stroke:   Right posterior MCA, punctate b/l MCA/PCA, right MCA/PCA, right  MCA/ACA infarcts secondary unknown source. Needs to rule out endocarditis  CT head 10/26 No acute abnormality.   MRI 10/27 Few small B posterior cerebral convexity infarcts and remote lacunes in L corona radiata and B thalami  CT head 10/28 new R occipitoparietal hypodensity, chronic infarcts  CTA head & neck 10/28 no LVO. Diffuse atherosclerosis throughout. High-grade R PCA branch stenosis   2D Echo EF 60-65%. No source of embolus   TEE 11/3 at 0945 a to rule out endocarditis. If neg, will consider loop recorder to rule out afib, loop can be done on Wed, depending on planned date of d/c.   LE Venous Dopplers - neg  LDL 111  HgbA1c 6.5  UDS - negative  VTE prophylaxis - Lovenox 30 mg sq daily   No antithrombotic prior to admission, now on aspirin 325 mg daily. Avoid DAPT for now, pending to r/o endocarditis.   Therapy recommendations:  HH PT, H OT  Disposition:  pending   Acute Respiratory Failure Underlying COPD and probable aspiration PNA  Intubated 10/28, post Sz  CCM on board  Extubated 10/30  Tolerating well  Seizure  Focal to bilateral tonic clonic seizures - witnessed by staff  EEG 10/28 generalized intermittent slowing, generalized background attenuation  LT EEG 10/06/2020 2055 to 10/30/221 0900:  milddiffuse encephalopathy, nonspecific etiology but likely related to sedation.No seizures ordefiniteepileptiform discharges were seen throughout the record  Keppra was given one time dose 753m on 10/28  On keppra 5099mbid now  Fever and leukocytosis - probable aspiration PNA  T Max - 100.4->98.2->100.3->afebrile  Leukocytosis - 8.5->11.8->13.7->16.1->11.9 (Decadron tapering)  Sputum culture - rare MRSA  IV Unasyn 10/29>>  (for 5-7d course)  vanco 10/30>>10/31  Blood culture - 10/30 -  NGTD   Hypertensive Emergency  BP as high as 228/191  Stable now   On metoprolol . Long-term BP goal normotensive . Avoid low BP due to high-grade R  PCA branch stenosis   Hyperlipidemia  Home meds:  No statin  LDL 111, goal < 70  Now on lipitor 40  Continue statin at  discharge  Diabetes type II, controlled  HgbA1c 6.5, goal < 7.0  CBGs  SSI  On Levemir  PCP follow up  Dysphagia . Secondary to stroke . Passed swallow post extubation . On diet now . Speech on board  R wrist drop  Per daughter, it has been several weeks  Has appointment with Dr. Posey Sullivan on 11/21/20  Needs outpt EMG/NCS with Dr. Posey Sullivan  Put on wrist splint  Continue PT/OT  Discussed with Dr. Posey Sullivan from [facility] - will check ESR, CRP, ANCA, cryoglobulin, hepatitis panel and ANA for vasculitis work-up  Hypernatremia, resolved    Na 142->137  Was on FW 200 Q4h and 1/2NS -> now off  On diet  Encourage po intake  Tobacco abuse  Current smoker  Smoking cessation counseling provided  Pt is willing to quit  Other Stroke Risk Factors  Family hx stroke (mother)  Other Active Problems  Vit B 12 deficiency - on supplement  Elevated Hgb, now 14.6 - resolved  Chronic pain  Hospital day # 6   Judith Hawking, MD PhD Stroke Neurology 10/11/2020 3:19 PM   To contact Stroke Continuity provider, please refer to [email or web address] After hours, contact General Neurology

## 2020-10-11 NOTE — Progress Notes (Signed)
PROGRESS NOTE    Judith Sullivan  OXB:[id number] DOB: 1961-06-01 DOA: 10/04/2020 PCP: Glenda Chroman, MD   Brief Narrative:  59 year old with history of COPD, DM2, chronic pain has been having ongoing confusion prior to admission for several days admitted initially to ICU with CVA, seizure with encephalopathy started on Keppra.  MRI showed by lateral hemispheric infarct, neurology was consulted.  Tonic-clonic seizure was witnessed by ICU staff and transferred to [facility].  CT of the head showed edema along right occipital parietal region, CTA head and neck did not show any large vessel occlusion.  Speech and swallow study-high risk of aspiration.  Due to worsening of respiratory status initially placed on BiPAP but thereafter had to be intubated on 10/28 and extubated 10/30.  Echocardiogram-EF 60 to 65%, grade 1 DD, carotid ultrasound-right-sided ICA stenosis less than 50%, EEG severe diffuse encephalopathy.  Initially started on vancomycin thereafter switched to Unasyn.  In the ICU was also started on Decadron for cerebral vasogenic edema which can be tapered off.  Plans for TEE 11/3.   Assessment & Plan:   Active Problems:   Metabolic encephalopathy   AMS (altered mental status)   Cerebrovascular accident (CVA) ([place])   Seizures ([place])   Palliative care by specialist   Goals of care, counseling/discussion   Acute encephalopathy  Acute bilateral posterior cerebral infarcts -CTA of the head and neck negative for large vessel occlusion but shows high-grade right-sided PCA stenosis -Echocardiogram 60 to 65%, grade 1 DD -Carotid ultrasound-left side difficult to assess, right-sided ICA stenosis less than 50% -Neurology following-TEE planned 11/30, loop recorder plans for TEE results. -Antiplatelet therapy-aspirin 325 mg daily, avoid DAPT for now per neuro pending to rule out endocarditis -Statin-Lipitor 40 mg -LDL 111, A1c 6.5 -UDS-negative -PT/OT-SNF -Started on Decadron in the  ICU for cerebral vasogenic edema, will start weaning this off.  Reduce frequency to every 12 hours  Tonic-clonic seizures with acute metabolic encephalopathy -EEG showing severe diffuse encephalopathy -Antiepileptic-Keppra 500 mg twice daily -Neurology following  Acute hypoxic respiratory failure secondary to probable aspiration pneumonia -Initially intubated 10/28, extubated 10/30 -Speech and swallow-we will need outpatient follow-up -Currently on empiric Unasyn 10/29>>  , plan to complete 5 to 7-day course. -As needed bronchodilators -Aspiration precautions  Hypernatremia -Improved.  Closely monitor for now.  Diabetes mellitus type 2 -Home Metformin and Januvia on hold -Sliding scale Accu-Chek -Increase Levemir to 8 units -D5 half-normal saline while n.p.o.  Essential hypertension -Permissive hypertension for now.  Right wrist drop -Outpatient follow-up for EMG with Dr. Posey Pronto -Wrist brace per PT  B12 deficiency -Continue replacement therapy     DVT prophylaxis: Lovenox Code Status: Full code Family Communication:    Status is: Inpatient  Remains inpatient appropriate because:Inpatient level of care appropriate due to severity of illness   Dispo: The patient is from: Home              Anticipated d/c is to: SNF              Anticipated d/c date is: > 3 days              Patient currently is not medically stable to d/c. On going evaluation of endocarditis. TEE planned for Wednesday        Body mass index is 17.57 kg/m.     Subjective: Sitting in the chair no complaints. Cepacol lozenges are helping her throat discomfort.   Examination: Constitutional: Not in acute distress Respiratory: Clear to auscultation  bilaterally Cardiovascular: Normal sinus rhythm, no rubs Abdomen: Nontender nondistended good bowel sounds Musculoskeletal: No edema noted Skin: No rashes seen Neurologic: CN 2-12 grossly intact.  And nonfocal Psychiatric: Normal judgment and  insight. Alert and oriented x 3. Normal mood.    Objective: Vitals:   10/10/20 1601 10/10/20 1953 10/10/20 2336 10/11/20 0413  BP: (!) 157/74 (!) 161/82 (!) 163/92 (!) 159/83  Pulse: 80 90 62 73  Resp: _0 Temp: 98.3 F (36.8 C) 98.3 F (36.8 C) 97.9 F (36.6 C) 98.2 F (36.8 C)  TempSrc: Oral Oral Oral Oral  SpO2: 93% 93% 97% 97%  Weight:      Height:        Intake/Output Summary (Last 24 hours) at 10/11/2020 0746 Last data filed at 10/10/2020 1700 Gross per 24 hour  Intake 2210 ml  Output --  Net 2210 ml   Filed Weights   10/06/20 1828 10/07/20 0400 10/08/20 0500  Weight: 41.9 kg 45.4 kg 45 kg     Data Reviewed:   CBC: Recent Labs  Lab 10/04/20 1120 10/04/20 1120 10/05/20 0320 10/06/20 0936 10/08/20 0446 10/09/20 1135 10/11/20 0443  WBC 8.3   < > 8.5 11.8* 13.7* 16.1* 11.9*  NEUTROABS 7.5  --  6.9  --   --   --   --   HGB 16.9*   < > 16.1* 17.9* 14.2 14.9 14.6  HCT 48.7*   < > 47.1* 52.3* 43.8 44.5 41.5  MCV 92.8   < > 94.4 96.3 100.0 95.7 91.8  PLT 336   < > 327 297 168 173 169   < > = values in this interval not displayed.   Basic Metabolic Panel: Recent Labs  Lab 10/05/20 0320 10/06/20 0936 10/07/20 1452 10/07/20 1816 10/08/20 0446 10/09/20 1135 10/11/20 0443  NA 144 151*  --   --  153* 142 137  K 3.3* 3.6  --   --  3.9 4.7 3.7  CL 107 109  --   --  120* 106 104  CO2 23 25  --   --  _1 GLUCOSE 155* 159*  --   --  188* 178* 179*  BUN 38* 43*  --   --  36* 23* 18  CREATININE 0.81 0.88  --   --  0.90 0.83 0.68  CALCIUM 9.6 10.0  --   --  8.6* 9.2 8.3*  MG 2.3  --  2.3 2.2 2.0  --  1.9  PHOS  --   --  2.2* 2.0* 2.6  --   --    GFR: Estimated Creatinine Clearance: 54.5 mL/min (by C-G formula based on SCr of 0.68 mg/dL). Liver Function Tests: Recent Labs  Lab 10/04/20 1120 10/05/20 0320 10/06/20 0936 10/09/20 1135  AST 46* _2 ALT 92* 61* 43 20  ALKPHOS 56 49 47 37*  BILITOT 1.3* 1.4* 1.5* 0.8  PROT 7.7 6.9 7.0  5.5*  ALBUMIN 4.4 4.0 4.2 3.4*   No results for input(s): LIPASE, AMYLASE in the last 168 hours. Recent Labs  Lab 10/04/20 1120  AMMONIA 9   Coagulation Profile: No results for input(s): INR, PROTIME in the last 168 hours. Cardiac Enzymes: Recent Labs  Lab 10/04/20 1120  CKTOTAL 961*   BNP (last 3 results) No results for input(s): PROBNP in the last 8760 hours. HbA1C: No results for input(s): HGBA1C in the last 72 hours. CBG: Recent Labs  Lab 10/10/20 0635 10/10/20 1120  10/10/20 1624 10/10/20 2101 10/11/20 0624  GLUCAP 100* 120* 243* 246* 161*   Lipid Profile: No results for input(s): CHOL, HDL, LDLCALC, TRIG, CHOLHDL, LDLDIRECT in the last 72 hours. Thyroid Function Tests: No results for input(s): TSH, T4TOTAL, FREET4, T3FREE, THYROIDAB in the last 72 hours. Anemia Panel: No results for input(s): VITAMINB12, FOLATE, FERRITIN, TIBC, IRON, RETICCTPCT in the last 72 hours. Sepsis Labs: Recent Labs  Lab 10/04/20 1120  LATICACIDVEN 1.4    Recent Results (from the past 240 hour(s))  Respiratory Panel by RT PCR (Flu A&B, Covid) - Nasopharyngeal Swab     Status: None   Collection Time: 10/04/20 11:46 AM   Specimen: Nasopharyngeal Swab  Result Value Ref Range Status   SARS Coronavirus 2 by RT PCR NEGATIVE NEGATIVE Final    Comment: (NOTE) SARS-CoV-2 target nucleic acids are NOT DETECTED.  The SARS-CoV-2 RNA is generally detectable in upper respiratoy specimens during the acute phase of infection. The lowest concentration of SARS-CoV-2 viral copies this assay can detect is 131 copies/mL. A negative result does not preclude SARS-Cov-2 infection and should not be used as the sole basis for treatment or other patient management decisions. A negative result may occur with  improper specimen collection/handling, submission of specimen other than nasopharyngeal swab, presence of viral mutation(s) within the areas targeted by this assay, and inadequate number of viral  copies (<131 copies/mL). A negative result must be combined with clinical observations, patient history, and epidemiological information. The expected result is Negative.  Fact Sheet for Patients:  https://www.fda.gov/media/142436/download  Fact Sheet for Healthcare Providers:  https://www.fda.gov/media/142435/download  This test is no t yet approved or cleared by the [place] FDA and  has been authorized for detection and/or diagnosis of SARS-CoV-2 by FDA under an Emergency Use Authorization (EUA). This EUA will remain  in effect (meaning this test can be used) for the duration of the COVID-19 declaration under Section 564(b)(1) of the Act, 21 U.S.C. section 360bbb-3(b)(1), unless the authorization is terminated or revoked sooner.     Influenza A by PCR NEGATIVE NEGATIVE Final   Influenza B by PCR NEGATIVE NEGATIVE Final    Comment: (NOTE) The Xpert Xpress SARS-CoV-2/FLU/RSV assay is intended as an aid in  the diagnosis of influenza from Nasopharyngeal swab specimens and  should not be used as a sole basis for treatment. Nasal washings and  aspirates are unacceptable for Xpert Xpress SARS-CoV-2/FLU/RSV  testing.  Fact Sheet for Patients: https://www.fda.gov/media/142436/download  Fact Sheet for Healthcare Providers: https://www.fda.gov/media/142435/download  This test is not yet approved or cleared by the [place] FDA and  has been authorized for detection and/or diagnosis of SARS-CoV-2 by  FDA under an Emergency Use Authorization (EUA). This EUA will remain  in effect (meaning this test can be used) for the duration of the  Covid-19 declaration under Section 564(b)(1) of the Act, 21  U.S.C. section 360bbb-3(b)(1), unless the authorization is  terminated or revoked. Performed at [facility], [address] [place], [place] [address]   MRSA PCR Screening     Status: Abnormal   Collection Time: 10/06/20  9:59 AM   Specimen: Nasal Mucosa; Nasopharyngeal  Result  Value Ref Range Status   MRSA by PCR POSITIVE (A) NEGATIVE Final    Comment:        The GeneXpert MRSA Assay (FDA approved for NASAL specimens only), is one component of a comprehensive MRSA colonization surveillance program. It is not intended to diagnose MRSA infection nor to guide or monitor treatment for MRSA   infections. RESULT CALLED TO, READ BACK BY AND VERIFIED WITH: C RUECKEL,RN @0457 10/07/20 MKELLY Performed at [facility], [address] [place], [place] [address]   Culture, respiratory (non-expectorated)     Status: None   Collection Time: 10/06/20  4:29 PM   Specimen: Tracheal Aspirate; Respiratory  Result Value Ref Range Status   Specimen Description   Final    TRACHEAL ASPIRATE Performed at [facility], [address] [place], [place] [address]    Special Requests   Final    NONE Performed at  Hospital, [address] [place], [place] [address]    Gram Stain   Final    ABUNDANT WBC PRESENT, PREDOMINANTLY PMN MODERATE GRAM POSITIVE COCCI RARE GRAM NEGATIVE RODS Performed at [facility], [address] [place], [place] [address]    Culture RARE METHICILLIN RESISTANT STAPHYLOCOCCUS AUREUS  Final   Report Status 10/10/2020 FINAL  Final   Organism ID, Bacteria METHICILLIN RESISTANT STAPHYLOCOCCUS AUREUS  Final      Susceptibility   Methicillin resistant staphylococcus aureus - MIC*    CIPROFLOXACIN >=8 RESISTANT Resistant     ERYTHROMYCIN >=8 RESISTANT Resistant     GENTAMICIN <=0.5 SENSITIVE Sensitive     OXACILLIN >=4 RESISTANT Resistant     TETRACYCLINE <=1 SENSITIVE Sensitive     VANCOMYCIN 1 SENSITIVE Sensitive     TRIMETH/SULFA <=10 SENSITIVE Sensitive     CLINDAMYCIN <=0.25 SENSITIVE Sensitive     RIFAMPIN <=0.5 SENSITIVE Sensitive     Inducible Clindamycin NEGATIVE Sensitive     * RARE METHICILLIN RESISTANT STAPHYLOCOCCUS AUREUS  Culture, blood (Routine X 2) w Reflex to ID Panel     Status: None (Preliminary result)   Collection Time:  10/08/20 11:27 AM   Specimen: BLOOD RIGHT HAND  Result Value Ref Range Status   Specimen Description BLOOD RIGHT HAND  Final   Special Requests   Final    BOTTLES DRAWN AEROBIC AND ANAEROBIC Blood Culture adequate volume   Culture   Final    NO GROWTH 3 DAYS Performed at [facility], [address] [place], [place] [address]    Report Status PENDING  Incomplete  Culture, blood (Routine X 2) w Reflex to ID Panel     Status: None (Preliminary result)   Collection Time: 10/08/20 11:36 AM   Specimen: BLOOD LEFT HAND  Result Value Ref Range Status   Specimen Description BLOOD LEFT HAND  Final   Special Requests   Final    BOTTLES DRAWN AEROBIC AND ANAEROBIC Blood Culture adequate volume   Culture   Final    NO GROWTH 3 DAYS Performed at [facility], [address] ,  [address]    Report Status PENDING  Incomplete         Radiology Studies: No results found.      Scheduled Meds: . aspirin  300 mg Rectal Daily   Or  . aspirin  325 mg Oral Daily  . atorvastatin  40 mg Oral Daily  . chlorhexidine gluconate (MEDLINE KIT)  15 mL Mouth Rinse BID  . cholecalciferol  1,000 Units Oral Daily  . cyanocobalamin  1,000 mcg Intramuscular Daily  . dexamethasone (DECADRON) injection  4 mg Intravenous Q8H  . enoxaparin (LOVENOX) injection  30 mg Subcutaneous Q24H  . feeding supplement  237 mL Oral TID BM  . insulin aspart  0-15 Units Subcutaneous TID AC & HS  . insulin detemir  5 Units Subcutaneous QHS  .   levETIRAcetam  500 mg Oral BID  . loratadine  10 mg Oral Daily  . metoprolol tartrate  25 mg Oral BID  . multivitamin with minerals  1 tablet Oral Daily  . mupirocin ointment  1 application Nasal BID  . potassium chloride  40 mEq Oral Once   Continuous Infusions: . ampicillin-sulbactam (UNASYN) IV 3 g (10/11/20 0427)     LOS: 6 days   Time spent=35 mins    Ankit Chirag Amin, MD [facility]  If 7PM-7AM, please contact  night-coverage  10/11/2020, 7:46 AM   

## 2020-10-11 NOTE — Progress Notes (Addendum)
Occupational Therapy Treatment Patient Details Name: Judith Sullivan MRN: [id number] DOB: March 01, 1961 Today's Date: 10/11/2020    History of present illness This 59 y.o. female originally admitted to [facility] with vomiting and AMS secondary to possible overdose of medications as she frequently takes too much medication per spouse's report to ED provider.  MRI of brain 10/27 showed small acute to subacute infarcts along the bilateral posterior cerebral convexities, remote lacunar infarcts in the left corona radiata and bil. thalamus.  On 10/28 she was noted to have tonic/clonic seizures and was transferred to [facility] for higher level of care. On arrival at [facility], she was in respiratory distress possibly due to aspiration and was placed on BiPAP then intubated.  EEG LTM performed.  CT of head on 10/28 showed moderate area of edema along the Rt occipital parietal cortex which is presumably an infarct, Extubated 10/30.  PMH includes:  HTN, CTS, COPD, asthma with bronchitis, s/p shoulder surgery, s/p neck fusion   OT comments  Patient continues to make steady progress towards goals in skilled OT session. Patient's session encompassed functional mobility, ADLs, and further assessment of RUE splint for wrist drop. Therapist unable to don splint due to IV access on top of RUE, however from a positioning standpoint, appears to be adequate positioning. Pt with set-up for basic ADLs, but declining OOB at this time (PT to see after this session). Due to progress, discharge updated to home health OT; will continue to follow acutely.     Follow Up Recommendations  Home health OT;Supervision/Assistance - 24 hour    Equipment Recommendations  Other (comment) (TBD)    Recommendations for Other Services      Precautions / Restrictions Precautions Precautions: Fall Precaution Comments: watch 02 Restrictions Weight Bearing Restrictions: No       Mobility Bed Mobility Overal bed mobility: Needs Assistance Bed Mobility:  Supine to Sit     Supine to sit: Min guard;HOB elevated Sit to supine: Supervision;HOB elevated   General bed mobility comments: Pt able to sit EOB with no assist needed and no extra time, min gaurd for safety  Transfers Overall transfer level: Needs assistance Equipment used: None Transfers: Sit to/from Stand Sit to Stand: Min guard         General transfer comment: Cued pt for B hand placement on bed as she originally attempted to place R hand on therapist, min guard for safety and extra time to power up to stand. No LOB noted.    Balance Overall balance assessment: Needs assistance Sitting-balance support: Feet supported;No upper extremity supported Sitting balance-Leahy Scale: Fair Sitting balance - Comments: supervision for safety   Standing balance support: No upper extremity supported;During functional activity Standing balance-Leahy Scale: Fair Standing balance comment: Able to perform functional mobility short distances without overt LOB but unsteadiness noted without UE support.                           ADL either performed or assessed with clinical judgement   ADL Overall ADL's : Needs assistance/impaired Eating/Feeding: Set up   Grooming: Wash/dry hands;Wash/dry face;Set up;Sitting               Lower Body Dressing: Set up;Sitting/lateral leans Lower Body Dressing Details (indicate cue type and reason): adjust socks             Functional mobility during ADLs: Minimal assistance;Cueing for safety;Cueing for sequencing General ADL Comments: Continues to progress, much improved bed mobility  and completing of ADL tasks     Vision       Perception     Praxis      Cognition Arousal/Alertness: Awake/alert Behavior During Therapy: WFL for tasks assessed/performed Overall Cognitive Status: Within Functional Limits for tasks assessed                                          Exercises Exercises: General Lower  Extremity (HEP verbally provided of SLR, LAQ, and bridges)   Shoulder Instructions       General Comments      Pertinent Vitals/ Pain       Pain Assessment: Faces Faces Pain Scale: Hurts a little bit Pain Location: generalized Pain Descriptors / Indicators: Grimacing Pain Intervention(s): Limited activity within patient's tolerance;Monitored during session  Home Living                                          Prior Functioning/Environment              Frequency  Min 2X/week        Progress Toward Goals  OT Goals(current goals can now be found in the care plan section)  Progress towards OT goals: Progressing toward goals  Acute Rehab OT Goals Patient Stated Goal: to go home and get tangles out of hair OT Goal Formulation: With patient Time For Goal Achievement: 10/21/20 Potential to Achieve Goals: Good  Plan Discharge plan needs to be updated    Co-evaluation                 AM-PAC OT "6 Clicks" Daily Activity     Outcome Measure   Help from another person eating meals?: A Little Help from another person taking care of personal grooming?: A Little Help from another person toileting, which includes using toliet, bedpan, or urinal?: A Little Help from another person bathing (including washing, rinsing, drying)?: A Little Help from another person to put on and taking off regular upper body clothing?: A Little Help from another person to put on and taking off regular lower body clothing?: A Little 6 Click Score: 18    End of Session    OT Visit Diagnosis: Unsteadiness on feet (R26.81);Cognitive communication deficit (R41.841);Muscle weakness (generalized) (M62.81) Symptoms and signs involving cognitive functions: Cerebral infarction   Activity Tolerance Patient tolerated treatment well   Patient Left in bed;with call bell/phone within reach;with family/visitor present (sitting EOB for lunch, RN made aware)   Nurse Communication  Mobility status;Other (comment) (sitting EOB for lunch)        Time: 1206-1219 OT Time Calculation (min): 13 min  Charges: OT General Charges $OT Visit: 1 Visit OT Treatments $Self Care/Home Management : 8-22 mins  Pollyann Glen E. Elroy Schembri, COTA/L Acute Rehabilitation Services [phone]  Cherlyn Cushing 10/11/2020, 3:00 PM

## 2020-10-11 NOTE — Progress Notes (Signed)
Physical Therapy Treatment Patient Details Name: Judith Sullivan MRN: [id number] DOB: [birth year] Today's Date: 10/11/2020    History of Present Illness This 59 y.o. female originally admitted to [facility] with vomiting and AMS secondary to possible overdose of medications as she frequently takes too much medication per spouse's report to ED provider.  MRI of brain 10/27 showed small acute to subacute infarcts along the bilateral posterior cerebral convexities, remote lacunar infarcts in the left corona radiata and bil. thalamus.  On 10/28 she was noted to have tonic/clonic seizures and was transferred to [facility] for higher level of care. On arrival at [facility], she was in respiratory distress possibly due to aspiration and was placed on BiPAP then intubated.  EEG LTM performed.  CT of head on 10/28 showed moderate area of edema along the Rt occipital parietal cortex which is presumably an infarct, Extubated 10/30.  PMH includes:  HTN, CTS, COPD, asthma with bronchitis, s/p shoulder surgery, s/p neck fusion    PT Comments    Pt continues to be limited in participation by nausea and fatigue. She becomes nauseated quickly after coming to stand, preventing her from ambulating further. She requires supervision to min guard assist for all functional mobility for safety purposes. She reports that she believes her mobility limitations are medically health related rather than due to LE weakness. As she is at a low level of burden of care, her husband is available to assist her 24/7, and she is aware of her deficits her recommendations have been updated to [facility] PT rather than SNF. Will continue to follow acutely to ensure proper recommendations and to maximize her independence and safety with all functional mobility through addressing her deficits mentioned below.   Follow Up Recommendations  Home health PT;Supervision for mobility/OOB     Equipment Recommendations  Rolling walker with 5" wheels;3in1 (PT) (TBD as pt progresses)     Recommendations for Other Services       Precautions / Restrictions Precautions Precautions: Fall Precaution Comments: watch 02 Restrictions Weight Bearing Restrictions: No    Mobility  Bed Mobility Overal bed mobility: Needs Assistance Bed Mobility: Supine to Sit;Sit to Supine     Supine to sit: Min guard;HOB elevated Sit to supine: Supervision;HOB elevated   General bed mobility comments: Extra time required to come to sit, but pt able to transition supine <> sit with min guard - supervision safely.  Transfers Overall transfer level: Needs assistance Equipment used: None Transfers: Sit to/from Stand Sit to Stand: Min guard         General transfer comment: Cued pt for B hand placement on bed as she originally attempted to place R hand on therapist, min guard for safety and extra time to power up to stand. No LOB noted.  Ambulation/Gait Ambulation/Gait assistance: Min guard Gait Distance (Feet): 3 Feet Assistive device: None Gait Pattern/deviations: Step-through pattern;Narrow base of support Gait velocity: decreased Gait velocity interpretation: <1.31 ft/sec, indicative of household ambulator General Gait Details: ~3 steps anteriorly and posteriorly back to bed to return to sit as pt was nauseated this date and easily fatigued. Min guard for safety but no overt LOB.    Stairs             Wheelchair Mobility    Modified Rankin (Stroke Patients Only) Modified Rankin (Stroke Patients Only) Pre-Morbid Rankin Score: No symptoms Modified Rankin: Moderately severe disability     Balance Overall balance assessment: Needs assistance Sitting-balance support: Feet supported;No upper extremity supported Sitting balance-Leahy Scale:  Fair Sitting balance - Comments: supervision for safety   Standing balance support: No upper extremity supported;During functional activity Standing balance-Leahy Scale: Fair Standing balance comment: Able to perform functional  mobility short distances without overt LOB but unsteadiness noted without UE support.                            Cognition Arousal/Alertness: Awake/alert Behavior During Therapy: WFL for tasks assessed/performed Overall Cognitive Status: Within Functional Limits for tasks assessed                                        Exercises      General Comments        Pertinent Vitals/Pain Pain Assessment: Faces Faces Pain Scale: Hurts a little bit Pain Location: generalized Pain Descriptors / Indicators: Grimacing Pain Intervention(s): Limited activity within patient's tolerance;Monitored during session    Home Living                      Prior Function            PT Goals (current goals can now be found in the care plan section) Acute Rehab PT Goals Patient Stated Goal: to go home and get tangles out of hair PT Goal Formulation: With patient/family Time For Goal Achievement: 10/19/20 Potential to Achieve Goals: Good Progress towards PT goals: Not progressing toward goals - comment (limited by nausea and fatigue)    Frequency    Min 4X/week      PT Plan Discharge plan needs to be updated    Co-evaluation              AM-PAC PT "6 Clicks" Mobility   Outcome Measure  Help needed turning from your back to your side while in a flat bed without using bedrails?: A Little Help needed moving from lying on your back to sitting on the side of a flat bed without using bedrails?: A Little Help needed moving to and from a bed to a chair (including a wheelchair)?: A Little Help needed standing up from a chair using your arms (e.g., wheelchair or bedside chair)?: A Little Help needed to walk in hospital room?: A Little Help needed climbing 3-5 steps with a railing? : A Lot 6 Click Score: 17    End of Session Equipment Utilized During Treatment: Gait belt Activity Tolerance: Patient tolerated treatment well;Patient limited by  fatigue;Other (comment) (limited by nausea) Patient left: in bed;with call bell/phone within reach;with bed alarm set;with family/visitor present (sister present during session)   PT Visit Diagnosis: Unsteadiness on feet (R26.81);Other abnormalities of gait and mobility (R26.89);Muscle weakness (generalized) (M62.81);Difficulty in walking, not elsewhere classified (R26.2)     Time: 4580-9983 PT Time Calculation (min) (ACUTE ONLY): 20 min  Charges:  $Therapeutic Activity: 8-22 mins                     Judith Sullivan, PT, DPT Acute Rehabilitation Services  Pager: [phone] Office: [phone]    Judith Sullivan 10/11/2020, 2:00 PM

## 2020-10-11 NOTE — Progress Notes (Signed)
    [facility] has been requested to perform a transesophageal echocardiogram on 10/12/20 for Stroke with Dr. Anne Fu.  After careful review of history and examination, the risks and benefits of transesophageal echocardiogram have been explained including risks of esophageal damage, perforation (1:10,000 risk), bleeding, pharyngeal hematoma as well as other potential complications associated with conscious sedation including aspiration, arrhythmia, respiratory failure and death. Alternatives to treatment were discussed, questions were answered. Patient is willing to proceed. Labs and vital signs stable.   Laverda Page, NP-C 10/11/2020 2:09 PM

## 2020-10-11 NOTE — Progress Notes (Signed)
Orthopedic Tech Progress Note Patient Details:  Judith Sullivan 09-17-1961 [id number]  Ortho Devices Type of Ortho Device: Velcro wrist splint Ortho Device/Splint Location: RUE Ortho Device/Splint Interventions: Ordered, Application, Adjustment   Post Interventions Patient Tolerated: Well Instructions Provided: Care of device   Donald Pore 10/11/2020, 3:03 PM

## 2020-10-12 ENCOUNTER — Inpatient Hospital Stay (HOSPITAL_COMMUNITY): Payer: 59

## 2020-10-12 ENCOUNTER — Inpatient Hospital Stay (HOSPITAL_COMMUNITY): Payer: 59 | Admitting: Anesthesiology

## 2020-10-12 ENCOUNTER — Encounter (HOSPITAL_COMMUNITY)
Admission: EM | Disposition: A | Discharge status: Home Health | Payer: Self-pay | Source: Home / Self Care | Attending: Pulmonary Disease

## 2020-10-12 ENCOUNTER — Encounter (HOSPITAL_COMMUNITY): Payer: Self-pay | Admitting: Pulmonary Disease

## 2020-10-12 ENCOUNTER — Other Ambulatory Visit: Payer: Self-pay | Admitting: Nurse Practitioner

## 2020-10-12 DIAGNOSIS — I6389 Other cerebral infarction: Secondary | ICD-10-CM

## 2020-10-12 HISTORY — PX: TEE WITHOUT CARDIOVERSION: SHX5443

## 2020-10-12 HISTORY — PX: LOOP RECORDER INSERTION: EP1214

## 2020-10-12 HISTORY — PX: BUBBLE STUDY: SHX6837

## 2020-10-12 LAB — CBC
HCT: 44.7 % (ref 36.0–46.0)
Hemoglobin: 15.7 g/dL — ABNORMAL HIGH (ref 12.0–15.0)
MCH: 32 pg (ref 26.0–34.0)
MCHC: 35.1 g/dL (ref 30.0–36.0)
MCV: 91 fL (ref 80.0–100.0)
Platelets: 211 10*3/uL (ref 150–400)
RBC: 4.91 MIL/uL (ref 3.87–5.11)
RDW: 11.8 % (ref 11.5–15.5)
WBC: 16.3 10*3/uL — ABNORMAL HIGH (ref 4.0–10.5)
nRBC: 0 % (ref 0.0–0.2)

## 2020-10-12 LAB — BASIC METABOLIC PANEL
Anion gap: 9 (ref 5–15)
BUN: 17 mg/dL (ref 6–20)
CO2: 24 mmol/L (ref 22–32)
Calcium: 8.9 mg/dL (ref 8.9–10.3)
Chloride: 105 mmol/L (ref 98–111)
Creatinine, Ser: 0.62 mg/dL (ref 0.44–1.00)
GFR, Estimated: >60 mL/min (ref >60)
Glucose, Bld: 111 mg/dL — ABNORMAL HIGH (ref 70–99)
Potassium: 4 mmol/L (ref 3.5–5.1)
Sodium: 138 mmol/L (ref 135–145)

## 2020-10-12 LAB — GLUCOSE, CAPILLARY
Glucose-Capillary: 114 mg/dL — ABNORMAL HIGH (ref 70–99)
Glucose-Capillary: 103 mg/dL — ABNORMAL HIGH (ref 70–99)
Glucose-Capillary: 106 mg/dL — ABNORMAL HIGH (ref 70–99)
Glucose-Capillary: 123 mg/dL — ABNORMAL HIGH (ref 70–99)

## 2020-10-12 LAB — HEPATITIS PANEL, ACUTE
HCV Ab: NONREACTIVE
Hep A IgM: NONREACTIVE
Hep B C IgM: NONREACTIVE
Hepatitis B Surface Ag: NONREACTIVE

## 2020-10-12 LAB — C-REACTIVE PROTEIN: CRP: 0.5 mg/dL (ref <1.0)

## 2020-10-12 LAB — SEDIMENTATION RATE: Sed Rate: 1 mm/hr (ref 0–22)

## 2020-10-12 LAB — MAGNESIUM: Magnesium: 1.9 mg/dL (ref 1.7–2.4)

## 2020-10-12 SURGERY — ECHOCARDIOGRAM, TRANSESOPHAGEAL
Anesthesia: Monitor Anesthesia Care

## 2020-10-12 SURGERY — LOOP RECORDER INSERTION

## 2020-10-12 MED ORDER — LIDOCAINE-EPINEPHRINE 1 %-1:100000 IJ SOLN
INTRAMUSCULAR | Status: DC | PRN
  Administered 2020-10-12: 10 mL

## 2020-10-12 MED ORDER — LIDOCAINE-EPINEPHRINE 1 %-1:100000 IJ SOLN
INTRAMUSCULAR | Status: AC
  Filled 2020-10-12: qty 1

## 2020-10-12 MED ORDER — DEXAMETHASONE 4 MG PO TABS: 4.0000 mg | ORAL_TABLET | Freq: Every day | ORAL | 0 refills | Status: AC

## 2020-10-12 MED ORDER — METOPROLOL TARTRATE 25 MG PO TABS: 25.0000 mg | ORAL_TABLET | Freq: Two times a day (BID) | ORAL | 0 refills | Status: DC

## 2020-10-12 MED ORDER — PROPOFOL 500 MG/50ML IV EMUL
INTRAVENOUS | Status: DC | PRN
  Administered 2020-10-12: 75 ug/kg/min via INTRAVENOUS

## 2020-10-12 MED ORDER — ASPIRIN 325 MG PO TABS: 325.0000 mg | ORAL_TABLET | Freq: Every day | ORAL | 0 refills | Status: AC

## 2020-10-12 MED ORDER — LACTATED RINGERS IV SOLN
INTRAVENOUS | Status: AC | PRN
  Administered 2020-10-12: 1000 mL via INTRAVENOUS

## 2020-10-12 MED ORDER — AMOXICILLIN-POT CLAVULANATE 875-125 MG PO TABS: 1.0000 | ORAL_TABLET | Freq: Two times a day (BID) | ORAL | 0 refills | Status: AC

## 2020-10-12 MED ORDER — CLOPIDOGREL BISULFATE 75 MG PO TABS: 75.0000 mg | ORAL_TABLET | Freq: Every day | ORAL | 0 refills | Status: AC

## 2020-10-12 MED ORDER — BUTAMBEN-TETRACAINE-BENZOCAINE 2-2-14 % EX AERO
INHALATION_SPRAY | CUTANEOUS | Status: DC | PRN
  Administered 2020-10-12: 1 via TOPICAL

## 2020-10-12 MED ORDER — VITAMIN D3 25 MCG PO TABS: 1000.0000 [IU] | ORAL_TABLET | Freq: Every day | ORAL | 0 refills | Status: AC

## 2020-10-12 MED ORDER — LIDOCAINE 2% (20 MG/ML) 5 ML SYRINGE
INTRAMUSCULAR | Status: DC | PRN
  Administered 2020-10-12: 40 mg via INTRAVENOUS

## 2020-10-12 MED ORDER — LEVETIRACETAM 500 MG PO TABS: 500.0000 mg | ORAL_TABLET | Freq: Two times a day (BID) | ORAL | 0 refills | Status: DC

## 2020-10-12 MED ORDER — ATORVASTATIN CALCIUM 40 MG PO TABS: 40.0000 mg | ORAL_TABLET | Freq: Every day | ORAL | 0 refills | Status: DC

## 2020-10-12 MED ORDER — VITAMIN B-12 100 MCG PO TABS: 100.0000 ug | ORAL_TABLET | Freq: Every day | ORAL | 0 refills | Status: AC

## 2020-10-12 SURGICAL SUPPLY — 2 items
MONITOR REVEAL LINQ II (Prosthesis & Implant Heart) ×2 IMPLANT
PACK LOOP INSERTION (CUSTOM PROCEDURE TRAY) ×2 IMPLANT

## 2020-10-12 NOTE — H&P (View-Only) (Signed)
ELECTROPHYSIOLOGY CONSULT NOTE  Patient ID: Judith Sullivan MRN: [id number], DOB/AGE: 58-Mar-1962   Admit date: 10/04/2020 Date of Consult: 10/12/2020  Primary Physician: Glenda Chroman, MD Primary Cardiologist: No primary care provider on file.  Primary Electrophysiologist: New to Dr. Lovena Le Reason for Consultation: Cryptogenic stroke; recommendations regarding Implantable Loop Recorder Insurance: [facility]  History of Present Illness EP has been asked to evaluate Judith Sullivan for placement of an implantable loop recorder to monitor for atrial fibrillation by Dr Erlinda Hong.  The patient was admitted on 10/04/2020 with acute encephalopathy and witnessed seizure, found to have stroke and transferred from [facility] to [facility] for higher level of care.  Imaging demonstrated: CT head 10/26 No acute abnormality.  MRI 10/27 Few small B posterior cerebral convexity infarcts and remote lacunes in L corona radiata and B thalami CT head 10/28 new R occipitoparietal hypodensity, chronic infarcts CTA head & neck 10/28 no LVO. Diffuse atherosclerosis throughout. High-grade R PCA branch stenosis   The patient has been monitored on telemetry which has demonstrated sinus rhythm with no arrhythmias.  Inpatient stroke work-up Judith Sullivan require a TEE per Neurology.   Echocardiogram this admission demonstrated 60-65% without source of embolus.  Lab work is reviewed.  Prior to admission, the patient denies chest pain, shortness of breath, dizziness, palpitations, or syncope.  They are recovering from their stroke with plans to return home  at discharge.  Past Medical History:  Diagnosis Date   Asthma with bronchitis    Backache    COPD (chronic obstructive pulmonary disease) (HCC)    CTS (carpal tunnel syndrome)    Hypertension    Kidney stones      Surgical History:  Past Surgical History:  Procedure Laterality Date   CESAREAN SECTION     CHOLECYSTECTOMY     kidney stone removal     neck fusion     SHOULDER SURGERY        Medications Prior to Admission  Medication Sig Dispense Refill Last Dose   amitriptyline (ELAVIL) 25 MG tablet Take 25 mg by mouth at bedtime.   unknown   gabapentin (NEURONTIN) 800 MG tablet Take 800 mg by mouth 3 (three) times daily.      oxyCODONE (ROXICODONE) 15 MG immediate release tablet Take 15 mg by mouth 4 (four) times daily as needed.      valACYclovir (VALTREX) 1000 MG tablet Take 1,000 mg by mouth daily.      levofloxacin (LEVAQUIN) 500 MG tablet Take 500 mg by mouth daily. (Patient not taking: Reported on 10/04/2020)   Completed Course at Unknown time   lisinopril (ZESTRIL) 20 MG tablet       metFORMIN (GLUCOPHAGE) 500 MG tablet Take by mouth 2 (two) times daily with a meal. Take 2 po qam and 1 po qhs.      metoprolol tartrate (LOPRESSOR) 50 MG tablet       predniSONE (DELTASONE) 5 MG tablet Take by mouth. (Patient not taking: Reported on 10/04/2020)   Completed Course at Unknown time   sitaGLIPtin (JANUVIA) 100 MG tablet Take 100 mg by mouth daily.      tiZANidine (ZANAFLEX) 4 MG tablet Take 4 mg by mouth every 8 (eight) hours as needed.       Inpatient Medications:   aspirin  300 mg Rectal Daily   Or   aspirin  325 mg Oral Daily   atorvastatin  40 mg Oral Daily   chlorhexidine gluconate (MEDLINE KIT)  15 mL Mouth Rinse BID  cholecalciferol  1,000 Units Oral Daily   cyanocobalamin  1,000 mcg Intramuscular Daily   dexamethasone (DECADRON) injection  4 mg Intravenous Q12H   enoxaparin (LOVENOX) injection  30 mg Subcutaneous Q24H   feeding supplement  237 mL Oral TID BM   insulin aspart  0-15 Units Subcutaneous TID AC & HS   insulin detemir  8 Units Subcutaneous QHS   levETIRAcetam  500 mg Oral BID   loratadine  10 mg Oral Daily   metoprolol tartrate  25 mg Oral BID   multivitamin with minerals  1 tablet Oral Daily    Allergies: No Known Allergies  Social History   Socioeconomic History   Marital status: Married    Spouse name: Not on file   Number of  children: 2   Years of education: 13   Highest education level: Not on file  Occupational History   Occupation: hairdresser  Tobacco Use   Smoking status: Current Every Day Smoker    Packs/day: 1.00    Years: 35.00    Pack years: 35.00   Smokeless tobacco: Never Used  Scientific laboratory technician Use: Never used  Substance and Sexual Activity   Alcohol use: Yes    Comment: very rarely   Drug use: Not Currently   Sexual activity: Not on file  Other Topics Concern   Not on file  Social History Narrative   Lives with husband in a one story home.  Has 2 children.  Works as a Theme park manager.  Education: 13 years.        Left handed   Social Determinants of Health   Financial Resource Strain:    Difficulty of Paying Living Expenses: Not on file  Food Insecurity:    Worried About [name] in the Last Year: Not on file   Ran Out of Food in the Last Year: Not on file  Transportation Needs:    Lack of Transportation (Medical): Not on file   Lack of Transportation (Non-Medical): Not on file  Physical Activity:    Days of Exercise per Week: Not on file   Minutes of Exercise per Session: Not on file  Stress:    Feeling of Stress : Not on file  Social Connections:    Frequency of Communication with Friends and Family: Not on file   Frequency of Social Gatherings with Friends and Family: Not on file   Attends Religious Services: Not on file   Active Member of Clubs or Organizations: Not on file   Attends Archivist Meetings: Not on file   Marital Status: Not on file  Intimate Partner Violence:    Fear of Current or Ex-Partner: Not on file   Emotionally Abused: Not on file   Physically Abused: Not on file   Sexually Abused: Not on file     Family History  Problem Relation Age of Onset   Stroke Mother    Hypertension Mother    Heart attack Father       Review of Systems: All other systems reviewed and are otherwise negative except as noted above.  Physical  Exam: Vitals:   10/11/20 1924 10/12/20 0015 10/12/20 0424 10/12/20 0857  BP: (!) 156/85 (!) 173/82 (!) 149/74 134/70  Pulse: 81 65 73 88  Resp: _0 Temp: 98.2 F (36.8 C) 97.8 F (36.6 C) 98 F (36.7 C) 98.4 F (36.9 C)  TempSrc: Oral Oral Oral Oral  SpO2: 96% 96% 99% 100%  Weight:  Height:        GEN- The patient is well appearing, alert and oriented x 3 today.   Head- normocephalic, atraumatic Eyes-  Sclera clear, conjunctiva pink Ears- hearing intact Oropharynx- clear Neck- supple Lungs- Clear to ausculation bilaterally, normal work of breathing Heart- Regular rate and rhythm, no murmurs, rubs or gallops  GI- soft, NT, ND, + BS Extremities- no clubbing, cyanosis, or edema MS- no significant deformity or atrophy Skin- no rash or lesion Psych- euthymic mood, full affect   Labs:   Lab Results  Component Value Date   WBC 16.3 (H) 10/12/2020   HGB 15.7 (H) 10/12/2020   HCT 44.7 10/12/2020   MCV 91.0 10/12/2020   PLT 211 10/12/2020    Recent Labs  Lab 10/09/20 1135 10/11/20 0443 10/12/20 0238  NA 142   < > 138  K 4.7   < > 4.0  CL 106   < > 105  CO2 27   < > 24  BUN 23*   < > 17  CREATININE 0.83   < > 0.62  CALCIUM 9.2   < > 8.9  PROT 5.5*  --   --   BILITOT 0.8  --   --   ALKPHOS 37*  --   --   ALT 20  --   --   AST 19  --   --   GLUCOSE 178*   < > 111*   < > = values in this interval not displayed.     Radiology/Studies: EEG  Result Date: 10/06/2020 Lora Havens, MD     10/06/2020  9:02 PM Patient Name: ROSABELLA EDGIN MRN: [id number] Epilepsy Attending: Lora Havens Referring Physician/Provider: Dr Tennis Must Date: 10/06/2020 Duration: Patient history: 59yo F with acute BL cerebra infarcts and ams. EEG to evaluate for seizure. Level of alertness:  comatose AEDs during EEG study: Propofol Technical aspects: This EEG study was done with scalp electrodes positioned according to the 10-20 International system of electrode placement.  Electrical activity was acquired at a sampling rate of _0  and reviewed with a high frequency filter of _1  and a low frequency filter of _2 . EEG data were recorded continuously and digitally stored. Description: EEG showed intermittent generalized 6-_3  theta-alpha activity as well as generalized 15-_4  beta activiy. Brief periods of generalized eeg attenuation were also noted. Multiple left and right temporal sharp transients were seen. Hyperventilation and photic stimulation were not performed.   ABNORMALITY - Intermittent slow, generalized - Background attenuation, generalized IMPRESSION: This study is suggestive of profound diffuse encephalopathy, nonspecific etiology but likely related to sedation. No seizures or definite epileptiform discharges were seen throughout the recording. If suspicion for interictal/ictal activity remains a concern, a prolonged study can be considered. Lora Havens   CT HEAD WO CONTRAST  Result Date: 10/06/2020 CLINICAL DATA:  Seizure with abnormal neuro exam EXAM: CT HEAD WITHOUT CONTRAST TECHNIQUE: Contiguous axial images were obtained from the base of the skull through the vertex without intravenous contrast. COMPARISON:  Brain MRI from yesterday FINDINGS: Brain: New area of moderate edema in the right occipital parietal cortex. No acute hemorrhage, hydrocephalus, or collection. By MRI there are small acute to subacute infarcts and there are chronic lacunar infarcts at the bilateral thalami and left corona radiata/external capsule. No bilaterality to implicate posterior reversible encephalopathy syndrome. No dural sinus thrombosis on preceding brain MRI. Suggest repeat brain MRI. Vascular: No hyperdense vessel. The dural sinuses were patent on preceding brain MRI. Skull: Negative  Sinuses/Orbits: Negative Other: A call has been placed to the ordering provider. IMPRESSION: Moderate area of edema along the right occipital parietal cortex which has occurred since brain  MRI yesterday, presumably interval infarct. The infarcts are somewhat clustered posteriorly, consider CTA to evaluate the posterior circulation. The dural sinuses were patent on the preceding brain MRI with contrast. Electronically Signed   By: Monte Fantasia M.D.   On: 10/06/2020 05:10   CT HEAD WO CONTRAST  Result Date: 10/04/2020 CLINICAL DATA:  Altered mental status. EXAM: CT HEAD WITHOUT CONTRAST TECHNIQUE: Contiguous axial images were obtained from the base of the skull through the vertex without intravenous contrast. COMPARISON:  Brain MRI 10/23/2018. FINDINGS: Brain: No evidence of acute infarction, hemorrhage, hydrocephalus, extra-axial collection or mass lesion/mass effect. Vascular: Atherosclerosis.  No hyperdense vessel. Skull: Intact.  No focal lesion. Sinuses/Orbits: Negative. Other: None. IMPRESSION: No acute abnormality. Atherosclerosis. Electronically Signed   By: Inge Rise M.D.   On: 10/04/2020 11:51   MR ANGIO HEAD WO CONTRAST  Result Date: 10/05/2020 CLINICAL DATA:  Mental status change with unknown cause EXAM: MRI HEAD WITHOUT AND WITH CONTRAST MRA HEAD WITHOUT CONTRAST TECHNIQUE: Multiplanar, multiecho pulse sequences of the brain and surrounding structures were obtained without and with intravenous contrast. Angiographic images of the head were obtained using MRA technique without contrast. CONTRAST:  60m GADAVIST GADOBUTROL 1 MMOL/ML IV SOLN COMPARISON:  Head CT from yesterday FINDINGS: MRI HEAD FINDINGS Brain: Punctate acute to subacute infarcts in the bilateral occipital cortex (see coronal diffusion), bilateral parietal white matter, and right posterior frontal white matter. Chronic lacunes are seen in the left corona radiata and bilateral thalamus. No acute hemorrhage, hydrocephalus, or masslike finding. No abnormal intracranial enhancement. Vascular: Normal flow voids Skull and upper cervical spine: Normal marrow signal Sinuses/Orbits: Negative MRA HEAD FINDINGS The  vertebral and basilar arteries are smooth and widely patent. Symmetric carotid siphons which are diffusely patent. There is presumably atheromatous (given vascular risk factors) irregularities of bilateral MCA and PCA branches. A left M2 branch appears cut off on the reformats but there are small emanating vessels on source images. The stenoses are moderate to advanced in severity. Negative for aneurysm IMPRESSION: Brain MRI: 1. 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 cerebral convexities. 2. Remote lacunar infarcts in the left corona radiata and bilateral thalamus. 3. Motion degraded. Intracranial MRA: Multifocal medium-sized vessel narrowings, presumably atheromatous. Most notable is a severe proximal left M2 branch stenosis. Electronically Signed   By: JMonte FantasiaM.D.   On: 10/05/2020 11:28   MR BRAIN W WO CONTRAST  Result Date: 10/05/2020 CLINICAL DATA:  Mental status change with unknown cause EXAM: MRI HEAD WITHOUT AND WITH CONTRAST MRA HEAD WITHOUT CONTRAST TECHNIQUE: Multiplanar, multiecho pulse sequences of the brain and surrounding structures were obtained without and with intravenous contrast. Angiographic images of the head were obtained using MRA technique without contrast. CONTRAST:  473mGADAVIST GADOBUTROL 1 MMOL/ML IV SOLN COMPARISON:  Head CT from yesterday FINDINGS: MRI HEAD FINDINGS Brain: Punctate acute to subacute infarcts in the bilateral occipital cortex (see coronal diffusion), bilateral parietal white matter, and right posterior frontal white matter. Chronic lacunes are seen in the left corona radiata and bilateral thalamus. No acute hemorrhage, hydrocephalus, or masslike finding. No abnormal intracranial enhancement. Vascular: Normal flow voids Skull and upper cervical spine: Normal marrow signal Sinuses/Orbits: Negative MRA HEAD FINDINGS The vertebral and basilar arteries are smooth and widely patent. Symmetric carotid siphons which are diffusely patent.  There is  presumably atheromatous (given vascular risk factors) irregularities of bilateral MCA and PCA branches. A left M2 branch appears cut off on the reformats but there are small emanating vessels on source images. The stenoses are moderate to advanced in severity. Negative for aneurysm IMPRESSION: Brain MRI: 1. 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 cerebral convexities. 2. Remote lacunar infarcts in the left corona radiata and bilateral thalamus. 3. Motion degraded. Intracranial MRA: Multifocal medium-sized vessel narrowings, presumably atheromatous. Most notable is a severe proximal left M2 branch stenosis. Electronically Signed   By: Monte Fantasia M.D.   On: 10/05/2020 11:28   US Carotid Bilateral (at [facility] and [facility] only)  Result Date: 10/05/2020 CLINICAL DATA:  Hypertension, stroke symptoms, diabetes EXAM: BILATERAL CAROTID DUPLEX ULTRASOUND TECHNIQUE: Pearline Cables scale imaging, color Doppler and duplex ultrasound were performed of bilateral carotid and vertebral arteries in the neck. COMPARISON:  None. FINDINGS: Criteria: Quantification of carotid stenosis is based on velocity parameters that correlate the residual internal carotid diameter with NASCET-based stenosis levels, using the diameter of the distal internal carotid lumen as the denominator for stenosis measurement. The following velocity measurements were obtained: RIGHT ICA: 64/14 cm/sec CCA: 19/50 cm/sec SYSTOLIC ICA/CCA RATIO:  0.7 ECA: 378 cm/sec LEFT Unable to perform the left carotid portion of the exam because the patient is not cooperative and unable to extend in the left neck for imaging. RIGHT CAROTID ARTERY: Minor echogenic shadowing plaque formation. No hemodynamically significant right ICA stenosis, velocity elevation, or turbulent flow. Degree of narrowing less than 50%. RIGHT VERTEBRAL ARTERY:  Normal antegrade flow LEFT CAROTID ARTERY:  Not performed LEFT VERTEBRAL ARTERY:  Not performed IMPRESSION: Bilateral  carotid atherosclerosis. No hemodynamically significant right ICA stenosis. Degree of narrowing less than 50% by ultrasound criteria. Normal antegrade right vertebral artery. Unable to perform the left carotid and vertebral artery portions of the exam. Electronically Signed   By: Jerilynn Mages.  Shick M.D.   On: 10/05/2020 15:07   DG CHEST PORT 1 VIEW  Result Date: 10/08/2020 CLINICAL DATA:  Acute encephalopathy EXAM: PORTABLE CHEST 1 VIEW COMPARISON:  10/07/2020 FINDINGS: Endotracheal and partially imaged enteric tubes again identified. Slight elevation of the left hemidiaphragm. No new consolidation or edema. No pleural effusion. No pneumothorax. Stable heart size. IMPRESSION: Stable lines and tubes.  Lungs remain clear. Electronically Signed   By: Macy Mis M.D.   On: 10/08/2020 07:18   Portable Chest xray  Result Date: 10/07/2020 CLINICAL DATA:  Intubation. EXAM: PORTABLE CHEST 1 VIEW COMPARISON:  Chest x-ray 10/06/2020. FINDINGS: Endotracheal tube and NG tube in stable position. Heart size normal. Lungs are clear. Mild basilar pleural thickening again noted consistent scarring. No pneumothorax. Surgical clips right upper quadrant. Mild thoracic spine scoliosis concave left. IMPRESSION: 1. Lines and tubes in stable position. 2. No acute cardiopulmonary disease. Electronically Signed   By: Marcello Moores  Register   On: 10/07/2020 05:32   Portable Chest x-ray  Result Date: 10/06/2020 CLINICAL DATA:  Intubation. EXAM: PORTABLE CHEST 1 VIEW COMPARISON:  Earlier film, same date. FINDINGS: The endotracheal tube is 3.6 cm above the carina. The NG tube is coursing down the esophagus and into the stomach. The cardiac silhouette, mediastinal and hilar contours are normal. The lungs are clear.  No pleural effusions or pneumothorax. The bony thorax is intact. IMPRESSION: 1. Endotracheal tube and NG tubes in good position. 2. No acute cardiopulmonary findings. Electronically Signed   By: Marijo Sanes M.D.   On: 10/06/2020  16:53   DG CHEST PORT  1 VIEW  Result Date: 10/06/2020 CLINICAL DATA:  Seizure.  Possible aspiration. EXAM: PORTABLE CHEST 1 VIEW COMPARISON:  10/04/2020. FINDINGS: 0426 hours. Lungs are hyperexpanded. The lungs are clear without focal pneumonia, edema, pneumothorax or pleural effusion. The cardiopericardial silhouette is within normal limits for size. The visualized bony structures of the thorax show no acute abnormality. Telemetry leads overlie the chest. IMPRESSION: No active disease. Electronically Signed   By: Misty Stanley M.D.   On: 10/06/2020 05:04   DG CHEST PORT 1 VIEW  Result Date: 10/04/2020 CLINICAL DATA:  Metabolic encephalopathy.  Smoker. EXAM: PORTABLE CHEST 1 VIEW COMPARISON:  Report 03/12/2018 FINDINGS: Numerous leads and wires project over the chest. Cervical spine fixation. Apical lordotic positioning. Midline trachea. Normal heart size. No pleural effusion or pneumothorax. Moderate hyperinflation. Clear lungs. IMPRESSION: Hyperinflation, without acute disease. Electronically Signed   By: Abigail Miyamoto M.D.   On: 10/04/2020 19:43   Overnight EEG with video  Result Date: 10/07/2020 Lora Havens, MD     10/08/2020  9:03 AM Patient Name: SAFIATOU ISLAM MRN: [id number] Epilepsy Attending: Lora Havens Referring Physician/Provider: Dr Zeb Comfort Duration: 10/06/2020 2055 to 10/29/221 2055   Patient history: 59yo F with acute BL cerebra infarcts and ams. EEG to evaluate for seizure.   Level of alertness:  comatose--->awake, asleep   AEDs during EEG study: Propofol   Technical aspects: This EEG study was done with scalp electrodes positioned according to the 10-20 International system of electrode placement. Electrical activity was acquired at a sampling rate of _0  and reviewed with a high frequency filter of _1  and a low frequency filter of _2 . EEG data were recorded continuously and digitally stored.   Description: EEG initially showed continuous generalized 2-_3  delta  slowing with overriding 15-_4  beta activity which at times appears sharply contoured. After propofol was stopped, EEG showed polymorphic disorganized 8-_5  generalized alpha activity as well as intermittent generalized 5-_6  theta and 13-_7  beta activity. Sleep was characterized by  Vertex waves, sleep spindles (12-_8 ), maximal frontocentral region. Hyperventilation and photic stimulation were not performed.     ABNORMALITY - Continuous slow, generalized   IMPRESSION: This study was initially suggestive of severe diffuse encephalopathy, nonspecific etiology but likely related to sedation. After propofol ws stopped, eeg showed mild to moderate diffuse encephalopathy, non specific to etiology. No seizures or definite epileptiform discharges were seen throughout the recording.   Lora Havens    ECHOCARDIOGRAM COMPLETE  Result Date: 10/05/2020    ECHOCARDIOGRAM REPORT   Patient Name:   ARIYEL JEANGILLES Date of Exam: 10/05/2020 Medical Rec #:  [id number]       Height:       60.0 in Accession #:    [id number]      Weight:       89.3 lb Date of Birth:  08-20-1961        BSA:          1.325 m Patient Age:    22 years        BP:           172/86 mmHg Patient Gender: F               HR:           85 bpm. Exam Location:  [facility] Procedure: 2D Echo, Cardiac Doppler and Color Doppler Indications:    Stroke 434.91 / I163.9  History:        Patient has no prior history of  Echocardiogram examinations.                 COPD; Risk Factors:Hypertension. AMS (altered mental status).  Sonographer:    Alvino Chapel RCS Referring Phys: Barton Dubois  Sonographer Comments: Technically difficult study due to poor echo windows. IMPRESSIONS  1. Left ventricular ejection fraction, by estimation, is 60 to 65%. The left ventricle has normal function. The left ventricle has no regional wall motion abnormalities. There is mild left ventricular hypertrophy. Left ventricular diastolic parameters are consistent with Grade I diastolic  dysfunction (impaired relaxation).  2. Right ventricular systolic function is normal. The right ventricular size is normal.  3. The mitral valve is normal in structure. No evidence of mitral valve regurgitation. No evidence of mitral stenosis.  4. The aortic valve has an indeterminant number of cusps. Aortic valve regurgitation is not visualized. No aortic stenosis is present.  5. The inferior vena cava is normal in size with greater than 50% respiratory variability, suggesting right atrial pressure of 3 mmHg. FINDINGS  Left Ventricle: Left ventricular ejection fraction, by estimation, is 60 to 65%. The left ventricle has normal function. The left ventricle has no regional wall motion abnormalities. The left ventricular internal cavity size was normal in size. There is  mild left ventricular hypertrophy. Left ventricular diastolic parameters are consistent with Grade I diastolic dysfunction (impaired relaxation). Normal left ventricular filling pressure. Right Ventricle: The right ventricular size is normal. No increase in right ventricular wall thickness. Right ventricular systolic function is normal. Left Atrium: Left atrial size was normal in size. Right Atrium: Right atrial size was normal in size. Pericardium: There is no evidence of pericardial effusion. Mitral Valve: The mitral valve is normal in structure. No evidence of mitral valve regurgitation. No evidence of mitral valve stenosis. Tricuspid Valve: The tricuspid valve is normal in structure. Tricuspid valve regurgitation is not demonstrated. No evidence of tricuspid stenosis. Aortic Valve: The aortic valve has an indeterminant number of cusps. Aortic valve regurgitation is not visualized. No aortic stenosis is present. Aortic valve mean gradient measures 4.5 mmHg. Aortic valve peak gradient measures 9.2 mmHg. Aortic valve area, by VTI measures 1.92 cm. Pulmonic Valve: The pulmonic valve was not well visualized. Pulmonic valve regurgitation is not  visualized. No evidence of pulmonic stenosis. Aorta: The aortic root is normal in size and structure. Pulmonary Artery: Indeterminant PASP, inadequate TR jet. Venous: The inferior vena cava is normal in size with greater than 50% respiratory variability, suggesting right atrial pressure of 3 mmHg. IAS/Shunts: No atrial level shunt detected by color flow Doppler.  LEFT VENTRICLE PLAX 2D LVIDd:         2.25 cm  Diastology LVIDs:         1.62 cm  LV e' medial:    6.96 cm/s LV PW:         1.13 cm  LV E/e' medial:  10.6 LV IVS:        1.12 cm  LV e' lateral:   6.20 cm/s LVOT diam:     1.70 cm  LV E/e' lateral: 11.9 LV SV:         49 LV SV Index:   37 LVOT Area:     2.27 cm  RIGHT VENTRICLE RV S prime:     13.70 cm/s TAPSE (M-mode): 1.7 cm LEFT ATRIUM             Index       RIGHT ATRIUM  Index LA diam:        2.70 cm 2.04 cm/m  RA Area:     5.33 cm LA Vol (A2C):   22.8 ml 17.21 ml/m RA Volume:   6.79 ml  5.12 ml/m LA Vol (A4C):   19.4 ml 14.64 ml/m LA Biplane Vol: 22.3 ml 16.83 ml/m  AORTIC VALVE AV Area (Vmax):    1.63 cm AV Area (Vmean):   1.64 cm AV Area (VTI):     1.92 cm AV Vmax:           152.01 cm/s AV Vmean:          99.790 cm/s AV VTI:            0.256 m AV Peak Grad:      9.2 mmHg AV Mean Grad:      4.5 mmHg LVOT Vmax:         109.00 cm/s LVOT Vmean:        71.900 cm/s LVOT VTI:          0.217 m LVOT/AV VTI ratio: 0.85  AORTA Ao Root diam: 2.60 cm MITRAL VALVE MV Area (PHT): 2.58 cm     SHUNTS MV Decel Time: 294 msec     Systemic VTI:  0.22 m MV E velocity: 73.70 cm/s   Systemic Diam: 1.70 cm MV A velocity: 119.00 cm/s MV E/A ratio:  0.62 Carlyle Dolly MD Electronically signed by Carlyle Dolly MD Signature Date/Time: 10/05/2020/4:14:53 PM    Final    VAS [place] LOWER EXTREMITY VENOUS (DVT)  Result Date: 10/08/2020  Lower Venous DVT Study Indications: Stroke.  Comparison Study: No prior study Performing Technologist: Sharion Dove RVS  Examination Guidelines: A complete evaluation  includes B-mode imaging, spectral Doppler, color Doppler, and power Doppler as needed of all accessible portions of each vessel. Bilateral testing is considered an integral part of a complete examination. Limited examinations for reoccurring indications may be performed as noted. The reflux portion of the exam is performed with the patient in reverse Trendelenburg.  +---------+---------------+---------+-----------+----------+-------------------+ RIGHT    CompressibilityPhasicitySpontaneityPropertiesThrombus Aging      +---------+---------------+---------+-----------+----------+-------------------+ CFV      Full                                         pulsatile waveforms                                                       noted               +---------+---------------+---------+-----------+----------+-------------------+ SFJ      Full                                                             +---------+---------------+---------+-----------+----------+-------------------+ FV Prox  Full                                                             +---------+---------------+---------+-----------+----------+-------------------+  FV Mid   Full                                                             +---------+---------------+---------+-----------+----------+-------------------+ FV DistalFull                                                             +---------+---------------+---------+-----------+----------+-------------------+ PFV      Full                                                             +---------+---------------+---------+-----------+----------+-------------------+ POP      Full                                         pulsatile waveforms                                                       noted               +---------+---------------+---------+-----------+----------+-------------------+ PTV      Full                                                              +---------+---------------+---------+-----------+----------+-------------------+ PERO     Full                                                             +---------+---------------+---------+-----------+----------+-------------------+   +---------+---------------+---------+-----------+----------+-------------------+ LEFT     CompressibilityPhasicitySpontaneityPropertiesThrombus Aging      +---------+---------------+---------+-----------+----------+-------------------+ CFV      Full                                         pulsatile waveforms                                                       noted               +---------+---------------+---------+-----------+----------+-------------------+ SFJ      Full                                                             +---------+---------------+---------+-----------+----------+-------------------+  FV Prox  Full                                                             +---------+---------------+---------+-----------+----------+-------------------+ FV Mid   Full                                                             +---------+---------------+---------+-----------+----------+-------------------+ FV DistalFull                                                             +---------+---------------+---------+-----------+----------+-------------------+ PFV      Full                                                             +---------+---------------+---------+-----------+----------+-------------------+ POP      Full                                         pulsatile waveforms                                                       noted               +---------+---------------+---------+-----------+----------+-------------------+ PTV      Full                                                              +---------+---------------+---------+-----------+----------+-------------------+ PERO     Full                                                             +---------+---------------+---------+-----------+----------+-------------------+    Summary: BILATERAL: - No evidence of deep vein thrombosis seen in the lower extremities, bilaterally. - RIGHT: pulsatile waveforms noted  LEFT: Pulsatile waveforms noted.  *See table(s) above for measurements and observations. Electronically signed by Deitra Mayo MD on 10/08/2020 at 1:51:35 PM.    Final    CT ANGIO HEAD CODE STROKE  Result Date: 10/06/2020 CLINICAL DATA:  Seizure.  Abnormal head CT EXAM: CT ANGIOGRAPHY HEAD AND NECK TECHNIQUE: Multidetector CT imaging of the  head and neck was performed using the standard protocol during bolus administration of intravenous contrast. Multiplanar CT image reconstructions and MIPs were obtained to evaluate the vascular anatomy. Carotid stenosis measurements (when applicable) are obtained utilizing NASCET criteria, using the distal internal carotid diameter as the denominator. CONTRAST:  21m OMNIPAQUE IOHEXOL 350 MG/ML SOLN COMPARISON:  MRI and MRA from yesterday. FINDINGS: CTA NECK FINDINGS Aortic arch: 3 vessel branching. Atheromatous plaque. No acute finding or dilatation. Right carotid system: Mixed density plaque at the bifurcation without stenosis or ulceration. Left carotid system: Mixed density plaque mainly at the bifurcation without stenosis or ulceration. Vertebral arteries: Bilateral proximal subclavian atherosclerosis without significant stenosis. The vertebral arteries are smooth and widely patent to the dura. Skeleton: C6-7 ACDF. Other neck: No incidental inflammation or mass seen. Upper chest: No acute finding Review of the MIP images confirms the above findings CTA HEAD FINDINGS Anterior circulation: Atheromatous plaque along the carotid siphons with up to mild stenosis. No branch occlusion,  beading, or aneurysm seen. A left M2 branch stenosis on prior MRA was primarily related to branching and artifact. MCA atheromatous changes are mild. Posterior circulation: Mild left vertebral artery dominance. The vertebral and basilar arteries are smooth and widely patent. Mild to moderate atheromatous irregularity of bilateral proximal posterior cerebral arteries. High-grade narrowing is seen at the upper branch of the right PCA. Negative for aneurysm. Venous sinuses: Diffusely patent Anatomic variants: None significant Review of the MIP images confirms the above findings IMPRESSION: 1. No emergent vascular finding.  No evident embolic source. 2. Cervical and intracranial atherosclerosis. Intracranial narrowings were overestimated on preceding MRA, but there is a high-grade right PCA branch stenosis. 3. No flow reducing stenosis or ulceration in the neck. Electronically Signed   By: JMonte FantasiaM.D.   On: 10/06/2020 06:08   CT ANGIO NECK CODE STROKE  Result Date: 10/06/2020 CLINICAL DATA:  Seizure.  Abnormal head CT EXAM: CT ANGIOGRAPHY HEAD AND NECK TECHNIQUE: Multidetector CT imaging of the head and neck was performed using the standard protocol during bolus administration of intravenous contrast. Multiplanar CT image reconstructions and MIPs were obtained to evaluate the vascular anatomy. Carotid stenosis measurements (when applicable) are obtained utilizing NASCET criteria, using the distal internal carotid diameter as the denominator. CONTRAST:  761mOMNIPAQUE IOHEXOL 350 MG/ML SOLN COMPARISON:  MRI and MRA from yesterday. FINDINGS: CTA NECK FINDINGS Aortic arch: 3 vessel branching. Atheromatous plaque. No acute finding or dilatation. Right carotid system: Mixed density plaque at the bifurcation without stenosis or ulceration. Left carotid system: Mixed density plaque mainly at the bifurcation without stenosis or ulceration. Vertebral arteries: Bilateral proximal subclavian atherosclerosis without  significant stenosis. The vertebral arteries are smooth and widely patent to the dura. Skeleton: C6-7 ACDF. Other neck: No incidental inflammation or mass seen. Upper chest: No acute finding Review of the MIP images confirms the above findings CTA HEAD FINDINGS Anterior circulation: Atheromatous plaque along the carotid siphons with up to mild stenosis. No branch occlusion, beading, or aneurysm seen. A left M2 branch stenosis on prior MRA was primarily related to branching and artifact. MCA atheromatous changes are mild. Posterior circulation: Mild left vertebral artery dominance. The vertebral and basilar arteries are smooth and widely patent. Mild to moderate atheromatous irregularity of bilateral proximal posterior cerebral arteries. High-grade narrowing is seen at the upper branch of the right PCA. Negative for aneurysm. Venous sinuses: Diffusely patent Anatomic variants: None significant Review of the MIP images confirms the above findings IMPRESSION: 1. No emergent vascular  finding.  No evident embolic source. 2. Cervical and intracranial atherosclerosis. Intracranial narrowings were overestimated on preceding MRA, but there is a high-grade right PCA branch stenosis. 3. No flow reducing stenosis or ulceration in the neck. Electronically Signed   By: Monte Fantasia M.D.   On: 10/06/2020 06:08    12-lead ECG NSR at 82 bpm (personally reviewed) No prior EKG's available   Telemetry NSR 60-90s (personally reviewed)  Assessment and Plan:  1. Cryptogenic stroke The patient presents with cryptogenic stroke.  The patient does have a TEE planned for this AM.  I spoke at length with the patient about monitoring for afib with an implantable loop recorder.  Risks, benefits, and alteratives to implantable loop recorder were discussed with the patient today.   At this time, the patient is very clear in their decision to proceed with implantable loop recorder.   2. HTN Per neuro s/p stroke  3. Seizures - EEG  10/28 generalized intermittent slowing, generalized background attenuation - LT EEG 10/06/2020 2055 to 10/30/221 0900:  mild diffuse encephalopathy, nonspecific etiology but likely related to sedation. No seizures or definite epileptiform discharges were seen throughout the record - Neuro following on Keppra  4. Tendency to over-medicate There is some question re: her home meds. Neuro following recommends psych referral and discontinuation of medications with potential for abuse. She denies taking more of her meds than ordered currently.    Wound care was reviewed with the patient (keep incision clean and dry for 3 days).  Wound check scheduled and entered in AVS. Please call with questions.   Shirley Friar, PA-C 10/12/2020 9:55 AM  I have seen and examined this patient with Oda Kilts.  Agree with above, note added to reflect my findings.  On exam, RRR, no murmurs.  Patient presented to the hospital with cryptogenic stroke. To date, no cause has been found. TEE planned for today. If unrevealing, Onesti Bonfiglio plan for LINQ monitor to look for atrial fibrillation. Risks and benefits discussed. Risks include but not limited to bleeding and infection. The patient understands the risks and has agreed to the procedure.  Kalisa Girtman M. Cyndia Degraff MD 10/12/2020 10:58 AM

## 2020-10-12 NOTE — Anesthesia Preprocedure Evaluation (Addendum)
Anesthesia Evaluation  Patient identified by MRN, date of birth, ID band Patient awake    Reviewed: Allergy & Precautions, NPO status , Patient's Chart, lab work & pertinent test results  Airway Mallampati: I  TM Distance: >3 FB Neck ROM: Full    Dental  (+) Edentulous Upper, Edentulous Lower   Pulmonary COPD, Current Smoker,    breath sounds clear to auscultation       Cardiovascular hypertension, Pt. on medications and Pt. on home beta blockers  Rhythm:Regular Rate:Normal     Neuro/Psych Seizures -,   Neuromuscular disease CVA negative psych ROS   GI/Hepatic negative GI ROS, Neg liver ROS,   Endo/Other  diabetes, Type 2, Oral Hypoglycemic Agents  Renal/GU      Musculoskeletal negative musculoskeletal ROS (+)   Abdominal Normal abdominal exam  (+)   Peds  Hematology negative hematology ROS (+)   Anesthesia Other Findings   Reproductive/Obstetrics                           Anesthesia Physical Anesthesia Plan  ASA: III  Anesthesia Plan: MAC   Post-op Pain Management:    Induction: Intravenous  PONV Risk Score and Plan: 0 and Propofol infusion  Airway Management Planned: Natural Airway and Simple Face Mask  Additional Equipment: None  Intra-op Plan:   Post-operative Plan:   Informed Consent: I have reviewed the patients History and Physical, chart, labs and discussed the procedure including the risks, benefits and alternatives for the proposed anesthesia with the patient or authorized representative who has indicated his/her understanding and acceptance.       Plan Discussed with: CRNA  Anesthesia Plan Comments:        Anesthesia Quick Evaluation

## 2020-10-12 NOTE — Discharge Summary (Signed)
Physician Discharge Summary  [name]:[id number] DOB: [birth year] DOA: 10/04/2020  PCP: Glenda Chroman, MD  Admit date: 10/04/2020 Discharge date: 10/12/2020  Admitted From: Home Discharge disposition: Home with home health PT   Code Status: Full Code  Diet Recommendation: Cardiac diet  Discharge Diagnosis:   Active Problems:   Metabolic encephalopathy   AMS (altered mental status)   Cerebrovascular accident (CVA) ([place])   Seizures ([place])   Palliative care by specialist   Goals of care, counseling/discussion   Acute encephalopathy  History of Present Illness / Brief narrative:  Patient is a 59 year old with history of COPD, DM2, chronic pain and tendency to overmedicate.   Patient presented to an outside hospital ED on 10/26 with progressively worsening confusion for several days.  She was admitted to ICU for acute encephalopathy.  Subsequent MRI showed lateral hemispheric infarct. Neurology was consulted.   Tonic-clonic seizure was witnessed by ICU staff and transferred to [facility].   CT of the head showed edema along right occipital parietal region, CTA head and neck did not show any large vessel occlusion.   Speech and swallow study showed a high risk of aspiration.    Due to worsening of respiratory status, patient was initially placed on BiPAP but thereafter had to be intubated on 10/28 and extubated 10/30.  11/3, patient underwent TEE and loop recorder placement.  Subjective:  Seen and examined this morning. Middle-aged Caucasian female.  Not in distress.  She was waiting for procedure done and was ready to go home in the afternoon.  Hospital Course:  Acute bilateral posterior cerebral infarcts -Per imaging studies and neurology note, patient has right posterior MCA, punctate b/l MCA/PCA, right MCA/PCA, right MCA/ACA infarcts secondary unknown source -Echocardiogram 60 to 65%, grade 1 DD.  TEE 11/3 ruled out endocarditis but showed a small  PFO. -Patient underwent loop recorder placement today 11/3. -Carotid ultrasound-left side difficult to assess, right-sided ICA stenosis less than 50% -Neurology has started the patient on antiplatelet therapy-aspirin 325 mg daily and Plavix 75 mg daily.  After 3 months, patient will continue aspirin alone. -Statin-Lipitor 40 mg -LDL 111, A1c 6.5 -UDS-negative -PT/OT obtained.  Acute metabolic encephalopathy Tonic-clonic seizures -EEG showing severe diffuse encephalopathy -Antiepileptic-Keppra 500 mg twice daily -In the ICU, she was also started on Decadron for cerebral vasogenic edema.  Currently on a tapering course.  3 more days of 4 mg oral Decadron post discharge.  Acute hypoxic respiratory failure secondary to probable aspiration pneumonia -Initially intubated 10/28, extubated 10/30 -Speech and swallow evaluation obtained.  Needs outpatient follow-up -Currently on empiric Unasyn  since 10/29.  Switch to oral Augmentin to complete 5 to 7-day course.  Hypernatremia -Improved.  Closely monitor for now. Recent Labs  Lab 10/06/20 0936 10/08/20 0446 10/09/20 1135 10/11/20 0443 10/12/20 0238  NA 151* 153* 142 137 138   Diabetes mellitus type 2 -A1c 6.5 on 10/26.   -Home Metformin and which Januvia can be resumed post discharge. Recent Labs  Lab 10/11/20 1308 10/11/20 1627 10/11/20 2108 10/12/20 0613 10/12/20 0819  GLUCAP 231* 211* 134* 114* 103*   Essential hypertension -On lisinopril and metoprolol at home.  Currently blood pressures running elevated on metoprolol.  Okay to resume lisinopril post discharge.    Right wrist drop -Outpatient follow-up for EMG with Dr. Posey Pronto -Wrist brace per PT -Vasculitis work-up sent by neurology.  ESR, CRP and hepatitis panel normal.  ANCA, cryoglobulin and ANA pending.  B12 deficiency -Continue replacement therapy  Stable for discharge home today with home health PT, rolling walker, 3 in 1   Wound care:    Discharge  Exam:   Vitals:   10/12/20 1206 10/12/20 1320 10/12/20 1330 10/12/20 1340  BP: (!) 163/74 132/77 (!) 159/65 (!) 181/78  Pulse: 85 89 83 82  Resp: (!) 22 18 (!) 21 (!) 21  Temp: 98.3 F (36.8 C) 98 F (36.7 C)    TempSrc: Oral Oral    SpO2: 96% 93% 93% 93%  Weight: 45 kg     Height: 5' 3"  (1.6 m)       Body mass index is 17.57 kg/m.  General exam: Appears calm and comfortable.  Not in physical distress Skin: No rashes, lesions or ulcers. HEENT: Atraumatic, normocephalic, supple neck, no obvious bleeding Lungs: Clear to auscultation bilaterally CVS: Regular rate and rhythm, no murmur GI/Abd soft, nontender, nondistended, bowel sound present CNS: Alert, awake monitor x3 Psychiatry: Mood appropriate Extremities: No pedal edema, no calf tenderness  Follow ups:   Discharge Instructions    Diet - low sodium heart healthy   Complete by: As directed    Diet Carb Modified   Complete by: As directed    Increase activity slowly   Complete by: As directed       Follow-up Information    [facility] Follow up.   Specialty: Home Health Services Why: The home health agency will contact you for the first home visit. Contact information: [address] [address] [phone]        [facility] Follow up on 10/27/2020.   Why: at 400 pm. Virtual visit for loop wound check Contact information: [place] [address] [phone]       Vyas, Dhruv B, MD Follow up.   Specialty: Internal Medicine Contact information: [place] [address] [phone]        [facility] Follow up.   Contact information: [address]     [place] [address] [phone]              Recommendations for Outpatient Follow-Up:   1. Follow-up with neurology as an outpatient 2. Follow-up with PCP as an  outpatient  Discharge Instructions:  Follow with Primary MD Jerene Bears B, MD in 7 days   Get CBC/BMP checked in next visit within 1 week by PCP or SNF MD ( we routinely change or add medications that can affect your baseline labs and fluid status, therefore we recommend that you get the mentioned basic workup next visit with your PCP, your PCP may decide not to get them or add new tests based on their clinical decision)  On your next visit with your PCP, please Get Medicines reviewed and adjusted.  Please request your PCP  to go over all Hospital Tests and Procedure/Radiological results at the follow up, please get all Hospital records sent to your Prim MD by signing hospital release before you go home.  Activity: As tolerated with Full fall precautions use walker/cane & assistance as needed  For Heart failure patients - Check your Weight same time everyday, if you gain over 2 pounds, or you develop in leg swelling, experience more shortness of breath or chest pain, call your Primary MD immediately. Follow Cardiac Low Salt Diet and 1.5 lit/day fluid restriction.  If you have smoked or chewed Tobacco in the last 2 yrs please stop smoking,  stop any regular Alcohol  and or any Recreational drug use.  If you experience worsening of your admission symptoms, develop shortness of breath, life threatening emergency, suicidal or homicidal thoughts you must seek medical attention immediately by calling 911 or calling your MD immediately  if symptoms less severe.  You Must read complete instructions/literature along with all the possible adverse reactions/side effects for all the Medicines you take and that have been prescribed to you. Take any new Medicines after you have completely understood and accpet all the possible adverse reactions/side effects.   Do not drive, operate heavy machinery, perform activities at heights, swimming or participation in water activities or provide baby sitting services if  your were admitted for syncope or siezures until you have seen by Primary MD or a Neurologist and advised to do so again.  Do not drive when taking Pain medications.  Do not take more than prescribed Pain, Sleep and Anxiety Medications  Wear Seat belts while driving.   Please note You were cared for by a hospitalist during your hospital stay. If you have any questions about your discharge medications or the care you received while you were in the hospital after you are discharged, you can call the unit and asked to speak with the hospitalist on call if the hospitalist that took care of you is not available. Once you are discharged, your primary care physician will handle any further medical issues. Please note that NO REFILLS for any discharge medications will be authorized once you are discharged, as it is imperative that you return to your primary care physician (or establish a relationship with a primary care physician if you do not have one) for your aftercare needs so that they can reassess your need for medications and monitor your lab values.    Allergies as of 10/12/2020   No Known Allergies     Medication List    STOP taking these medications   levofloxacin 500 MG tablet Commonly known as: LEVAQUIN   predniSONE 5 MG tablet Commonly known as: DELTASONE     TAKE these medications   amitriptyline 25 MG tablet Commonly known as: ELAVIL Take 25 mg by mouth at bedtime.   amoxicillin-clavulanate 875-125 MG tablet Commonly known as: Augmentin Take 1 tablet by mouth 2 (two) times daily for 3 days.   aspirin 325 MG tablet Take 1 tablet (325 mg total) by mouth daily. Aspirin 325 mg daily and Plavix 75 mg daily for 3 months.  After that aspirin alone. Start taking on: October 13, 2020   atorvastatin 40 MG tablet Commonly known as: LIPITOR Take 1 tablet (40 mg total) by mouth daily. Start taking on: October 13, 2020   clopidogrel 75 MG tablet Commonly known as: Plavix Take 1  tablet (75 mg total) by mouth daily. Aspirin 325 mg daily and Plavix 75 mg daily for 3 months.  After that aspirin alone.   dexamethasone 4 MG tablet Commonly known as: DECADRON Take 1 tablet (4 mg total) by mouth daily for 3 days.   gabapentin 800 MG tablet Commonly known as: NEURONTIN Take 800 mg by mouth 3 (three) times daily.   levETIRAcetam 500 MG tablet Commonly known as: KEPPRA Take 1 tablet (500 mg total) by mouth 2 (two) times daily.   lisinopril 20 MG tablet Commonly known as: ZESTRIL   metFORMIN 500 MG tablet Commonly known as: GLUCOPHAGE Take by mouth 2 (two) times daily with a meal. Take 2 po qam and 1 po qhs.  metoprolol tartrate 25 MG tablet Commonly known as: LOPRESSOR Take 1 tablet (25 mg total) by mouth 2 (two) times daily. What changed:   medication strength  how much to take  how to take this  when to take this   oxyCODONE 15 MG immediate release tablet Commonly known as: ROXICODONE Take 15 mg by mouth 4 (four) times daily as needed.   sitaGLIPtin 100 MG tablet Commonly known as: JANUVIA Take 100 mg by mouth daily.   tiZANidine 4 MG tablet Commonly known as: ZANAFLEX Take 4 mg by mouth every 8 (eight) hours as needed.   valACYclovir 1000 MG tablet Commonly known as: VALTREX Take 1,000 mg by mouth daily.   vitamin B-12 100 MCG tablet Commonly known as: CYANOCOBALAMIN Take 1 tablet (100 mcg total) by mouth daily.   Vitamin D3 25 MCG tablet Commonly known as: Vitamin D Take 1 tablet (1,000 Units total) by mouth daily. Start taking on: October 13, 2020            Durable Medical Equipment  (From admission, onward)         Start     Ordered   10/12/20 1613  DME Gilford Rile  Once       Question Answer Comment  Walker: With 5 Inch Wheels   Patient needs a walker to treat with the following condition Impaired mobility      10/12/20 1613   10/12/20 1613  DME 3-in-1  Once        10/12/20 1613          Time coordinating  discharge: 35 minutes  The results of significant diagnostics from this hospitalization (including imaging, microbiology, ancillary and laboratory) are listed below for reference.    Procedures and Diagnostic Studies:   CT HEAD WO CONTRAST  Result Date: 10/04/2020 CLINICAL DATA:  Altered mental status. EXAM: CT HEAD WITHOUT CONTRAST TECHNIQUE: Contiguous axial images were obtained from the base of the skull through the vertex without intravenous contrast. COMPARISON:  Brain MRI 10/23/2018. FINDINGS: Brain: No evidence of acute infarction, hemorrhage, hydrocephalus, extra-axial collection or mass lesion/mass effect. Vascular: Atherosclerosis.  No hyperdense vessel. Skull: Intact.  No focal lesion. Sinuses/Orbits: Negative. Other: None. IMPRESSION: No acute abnormality. Atherosclerosis. Electronically Signed   By: Inge Rise M.D.   On: 10/04/2020 11:51   MR ANGIO HEAD WO CONTRAST  Result Date: 10/05/2020 CLINICAL DATA:  Mental status change with unknown cause EXAM: MRI HEAD WITHOUT AND WITH CONTRAST MRA HEAD WITHOUT CONTRAST TECHNIQUE: Multiplanar, multiecho pulse sequences of the brain and surrounding structures were obtained without and with intravenous contrast. Angiographic images of the head were obtained using MRA technique without contrast. CONTRAST:  19m GADAVIST GADOBUTROL 1 MMOL/ML IV SOLN COMPARISON:  Head CT from yesterday FINDINGS: MRI HEAD FINDINGS Brain: Punctate acute to subacute infarcts in the bilateral occipital cortex (see coronal diffusion), bilateral parietal white matter, and right posterior frontal white matter. Chronic lacunes are seen in the left corona radiata and bilateral thalamus. No acute hemorrhage, hydrocephalus, or masslike finding. No abnormal intracranial enhancement. Vascular: Normal flow voids Skull and upper cervical spine: Normal marrow signal Sinuses/Orbits: Negative MRA HEAD FINDINGS The vertebral and basilar arteries are smooth and widely patent. Symmetric  carotid siphons which are diffusely patent. There is presumably atheromatous (given vascular risk factors) irregularities of bilateral MCA and PCA branches. A left M2 branch appears cut off on the reformats but there are small emanating vessels on source images. The stenoses are moderate to advanced in severity.  Negative for aneurysm IMPRESSION: Brain MRI: 1. 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 cerebral convexities. 2. Remote lacunar infarcts in the left corona radiata and bilateral thalamus. 3. Motion degraded. Intracranial MRA: Multifocal medium-sized vessel narrowings, presumably atheromatous. Most notable is a severe proximal left M2 branch stenosis. Electronically Signed   By: Monte Fantasia M.D.   On: 10/05/2020 11:28   MR BRAIN W WO CONTRAST  Result Date: 10/05/2020 CLINICAL DATA:  Mental status change with unknown cause EXAM: MRI HEAD WITHOUT AND WITH CONTRAST MRA HEAD WITHOUT CONTRAST TECHNIQUE: Multiplanar, multiecho pulse sequences of the brain and surrounding structures were obtained without and with intravenous contrast. Angiographic images of the head were obtained using MRA technique without contrast. CONTRAST:  83m GADAVIST GADOBUTROL 1 MMOL/ML IV SOLN COMPARISON:  Head CT from yesterday FINDINGS: MRI HEAD FINDINGS Brain: Punctate acute to subacute infarcts in the bilateral occipital cortex (see coronal diffusion), bilateral parietal white matter, and right posterior frontal white matter. Chronic lacunes are seen in the left corona radiata and bilateral thalamus. No acute hemorrhage, hydrocephalus, or masslike finding. No abnormal intracranial enhancement. Vascular: Normal flow voids Skull and upper cervical spine: Normal marrow signal Sinuses/Orbits: Negative MRA HEAD FINDINGS The vertebral and basilar arteries are smooth and widely patent. Symmetric carotid siphons which are diffusely patent. There is presumably atheromatous (given vascular risk factors)  irregularities of bilateral MCA and PCA branches. A left M2 branch appears cut off on the reformats but there are small emanating vessels on source images. The stenoses are moderate to advanced in severity. Negative for aneurysm IMPRESSION: Brain MRI: 1. 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 cerebral convexities. 2. Remote lacunar infarcts in the left corona radiata and bilateral thalamus. 3. Motion degraded. Intracranial MRA: Multifocal medium-sized vessel narrowings, presumably atheromatous. Most notable is a severe proximal left M2 branch stenosis. Electronically Signed   By: JMonte FantasiaM.D.   On: 10/05/2020 11:28   [place]  Result Date: 10/05/2020 CLINICAL DATA:  Hypertension, stroke symptoms, diabetes EXAM: BILATERAL CAROTID DUPLEX ULTRASOUND TECHNIQUE: GPearline Cablesscale imaging, color Doppler and duplex ultrasound were performed of bilateral carotid and vertebral arteries in the neck. COMPARISON:  None. FINDINGS: Criteria: Quantification of carotid stenosis is based on velocity parameters that correlate the residual internal carotid diameter with NASCET-based stenosis levels, using the diameter of the distal internal carotid lumen as the denominator for stenosis measurement. The following velocity measurements were obtained: RIGHT ICA: 64/14 cm/sec CCA: 699/35cm/sec SYSTOLIC ICA/CCA RATIO:  0.7 ECA: 378 cm/sec LEFT Unable to perform the left carotid portion of the exam because the patient is not cooperative and unable to extend in the left neck for imaging. RIGHT CAROTID ARTERY: Minor echogenic shadowing plaque formation. No hemodynamically significant right ICA stenosis, velocity elevation, or turbulent flow. Degree of narrowing less than 50%. RIGHT VERTEBRAL ARTERY:  Normal antegrade flow LEFT CAROTID ARTERY:  Not performed LEFT VERTEBRAL ARTERY:  Not performed IMPRESSION: Bilateral carotid atherosclerosis. No hemodynamically significant right  ICA stenosis. Degree of narrowing less than 50% by ultrasound criteria. Normal antegrade right vertebral artery. Unable to perform the left carotid and vertebral artery portions of the exam. Electronically Signed   By: MJerilynn Mages  Shick M.D.   On: 10/05/2020 15:07   DG CHEST PORT 1 VIEW  Result Date: 10/04/2020 CLINICAL DATA:  Metabolic encephalopathy.  Smoker. EXAM: PORTABLE CHEST 1 VIEW COMPARISON:  Report 03/12/2018 FINDINGS: Numerous leads and wires project over the chest.  Cervical spine fixation. Apical lordotic positioning. Midline trachea. Normal heart size. No pleural effusion or pneumothorax. Moderate hyperinflation. Clear lungs. IMPRESSION: Hyperinflation, without acute disease. Electronically Signed   By: Abigail Miyamoto M.D.   On: 10/04/2020 19:43   ECHOCARDIOGRAM COMPLETE  Result Date: 10/05/2020    ECHOCARDIOGRAM REPORT   Patient Name:   Judith Sullivan Date of Exam: 10/05/2020 Medical Rec #:  [id number]       Height:       60.0 in Accession #:    [id number]      Weight:       89.3 lb Date of Birth:  03-20-1961        BSA:          1.325 m Patient Age:    76 years        BP:           172/86 mmHg Patient Gender: F               HR:           85 bpm. Exam Location:  [facility] Procedure: 2D Echo, Cardiac Doppler and Color Doppler Indications:    Stroke 434.91 / I163.9  History:        Patient has no prior history of Echocardiogram examinations.                 COPD; Risk Factors:Hypertension. AMS (altered mental status).  Sonographer:    Alvino Chapel RCS Referring Phys: Barton Dubois  Sonographer Comments: Technically difficult study due to poor echo windows. IMPRESSIONS  1. Left ventricular ejection fraction, by estimation, is 60 to 65%. The left ventricle has normal function. The left ventricle has no regional wall motion abnormalities. There is mild left ventricular hypertrophy. Left ventricular diastolic parameters are consistent with Grade I diastolic dysfunction (impaired relaxation).  2. Right  ventricular systolic function is normal. The right ventricular size is normal.  3. The mitral valve is normal in structure. No evidence of mitral valve regurgitation. No evidence of mitral stenosis.  4. The aortic valve has an indeterminant number of cusps. Aortic valve regurgitation is not visualized. No aortic stenosis is present.  5. The inferior vena cava is normal in size with greater than 50% respiratory variability, suggesting right atrial pressure of 3 mmHg. FINDINGS  Left Ventricle: Left ventricular ejection fraction, by estimation, is 60 to 65%. The left ventricle has normal function. The left ventricle has no regional wall motion abnormalities. The left ventricular internal cavity size was normal in size. There is  mild left ventricular hypertrophy. Left ventricular diastolic parameters are consistent with Grade I diastolic dysfunction (impaired relaxation). Normal left ventricular filling pressure. Right Ventricle: The right ventricular size is normal. No increase in right ventricular wall thickness. Right ventricular systolic function is normal. Left Atrium: Left atrial size was normal in size. Right Atrium: Right atrial size was normal in size. Pericardium: There is no evidence of pericardial effusion. Mitral Valve: The mitral valve is normal in structure. No evidence of mitral valve regurgitation. No evidence of mitral valve stenosis. Tricuspid Valve: The tricuspid valve is normal in structure. Tricuspid valve regurgitation is not demonstrated. No evidence of tricuspid stenosis. Aortic Valve: The aortic valve has an indeterminant number of cusps. Aortic valve regurgitation is not visualized. No aortic stenosis is present. Aortic valve mean gradient measures 4.5 mmHg. Aortic valve peak gradient measures 9.2 mmHg. Aortic valve area, by VTI measures 1.92 cm. Pulmonic Valve: The pulmonic valve was not  well visualized. Pulmonic valve regurgitation is not visualized. No evidence of pulmonic stenosis.  Aorta: The aortic root is normal in size and structure. Pulmonary Artery: Indeterminant PASP, inadequate TR jet. Venous: The inferior vena cava is normal in size with greater than 50% respiratory variability, suggesting right atrial pressure of 3 mmHg. IAS/Shunts: No atrial level shunt detected by color flow Doppler.  LEFT VENTRICLE PLAX 2D LVIDd:         2.25 cm  Diastology LVIDs:         1.62 cm  LV e' medial:    6.96 cm/s LV PW:         1.13 cm  LV E/e' medial:  10.6 LV IVS:        1.12 cm  LV e' lateral:   6.20 cm/s LVOT diam:     1.70 cm  LV E/e' lateral: 11.9 LV SV:         49 LV SV Index:   37 LVOT Area:     2.27 cm  RIGHT VENTRICLE RV S prime:     13.70 cm/s TAPSE (M-mode): 1.7 cm LEFT ATRIUM             Index       RIGHT ATRIUM          Index LA diam:        2.70 cm 2.04 cm/m  RA Area:     5.33 cm LA Vol (A2C):   22.8 ml 17.21 ml/m RA Volume:   6.79 ml  5.12 ml/m LA Vol (A4C):   19.4 ml 14.64 ml/m LA Biplane Vol: 22.3 ml 16.83 ml/m  AORTIC VALVE AV Area (Vmax):    1.63 cm AV Area (Vmean):   1.64 cm AV Area (VTI):     1.92 cm AV Vmax:           152.01 cm/s AV Vmean:          99.790 cm/s AV VTI:            0.256 m AV Peak Grad:      9.2 mmHg AV Mean Grad:      4.5 mmHg LVOT Vmax:         109.00 cm/s LVOT Vmean:        71.900 cm/s LVOT VTI:          0.217 m LVOT/AV VTI ratio: 0.85  AORTA Ao Root diam: 2.60 cm MITRAL VALVE MV Area (PHT): 2.58 cm     SHUNTS MV Decel Time: 294 msec     Systemic VTI:  0.22 m MV E velocity: 73.70 cm/s   Systemic Diam: 1.70 cm MV A velocity: 119.00 cm/s MV E/A ratio:  0.62 Carlyle Dolly MD Electronically signed by Carlyle Dolly MD Signature Date/Time: 10/05/2020/4:14:53 PM    Final      Labs:   Basic Metabolic Panel: Recent Labs  Lab 10/06/20 0936 10/06/20 0936 10/07/20 1452 10/07/20 1816 10/08/20 0446 10/08/20 0446 10/09/20 1135 10/09/20 1135 10/11/20 0443 10/12/20 0238  NA 151*  --   --   --  153*  --  142  --  137 138  K 3.6   < >  --   --  3.9    < > 4.7   < > 3.7 4.0  CL 109  --   --   --  120*  --  106  --  104 105  CO2 25  --   --   --  24  --  27  --  23 24  GLUCOSE 159*  --   --   --  188*  --  178*  --  179* 111*  BUN 43*  --   --   --  36*  --  23*  --  18 17  CREATININE 0.88  --   --   --  0.90  --  0.83  --  0.68 0.62  CALCIUM 10.0  --   --   --  8.6*  --  9.2  --  8.3* 8.9  MG  --   --  2.3 2.2 2.0  --   --   --  1.9 1.9  PHOS  --   --  2.2* 2.0* 2.6  --   --   --   --   --    < > = values in this interval not displayed.   GFR Estimated Creatinine Clearance: 54.5 mL/min (by C-G formula based on SCr of 0.62 mg/dL). Liver Function Tests: Recent Labs  Lab 10/06/20 0936 10/09/20 1135  AST 15 19  ALT 43 20  ALKPHOS 47 37*  BILITOT 1.5* 0.8  PROT 7.0 5.5*  ALBUMIN 4.2 3.4*   No results for input(s): LIPASE, AMYLASE in the last 168 hours. No results for input(s): AMMONIA in the last 168 hours. Coagulation profile No results for input(s): INR, PROTIME in the last 168 hours.  CBC: Recent Labs  Lab 10/06/20 0936 10/08/20 0446 10/09/20 1135 10/11/20 0443 10/12/20 0238  WBC 11.8* 13.7* 16.1* 11.9* 16.3*  HGB 17.9* 14.2 14.9 14.6 15.7*  HCT 52.3* 43.8 44.5 41.5 44.7  MCV 96.3 100.0 95.7 91.8 91.0  PLT 297 168 173 169 211   Cardiac Enzymes: No results for input(s): CKTOTAL, CKMB, CKMBINDEX, TROPONINI in the last 168 hours. BNP: Invalid input(s): POCBNP CBG: Recent Labs  Lab 10/11/20 1308 10/11/20 1627 10/11/20 2108 10/12/20 0613 10/12/20 0819  GLUCAP 231* 211* 134* 114* 103*   D-Dimer No results for input(s): DDIMER in the last 72 hours. Hgb A1c No results for input(s): HGBA1C in the last 72 hours. Lipid Profile No results for input(s): CHOL, HDL, LDLCALC, TRIG, CHOLHDL, LDLDIRECT in the last 72 hours. Thyroid function studies No results for input(s): TSH, T4TOTAL, T3FREE, THYROIDAB in the last 72 hours.  Invalid input(s): FREET3 Anemia work up No results for input(s): VITAMINB12, FOLATE,  FERRITIN, TIBC, IRON, RETICCTPCT in the last 72 hours. Microbiology Recent Results (from the past 240 hour(s))  Respiratory Panel by RT PCR (Flu A&B, Covid) - Nasopharyngeal Swab     Status: None   Collection Time: 10/04/20 11:46 AM   Specimen: Nasopharyngeal Swab  Result Value Ref Range Status   SARS Coronavirus 2 by RT PCR NEGATIVE NEGATIVE Final    Comment: (NOTE) SARS-CoV-2 target nucleic acids are NOT DETECTED.  The SARS-CoV-2 RNA is generally detectable in upper respiratoy specimens during the acute phase of infection. The lowest concentration of SARS-CoV-2 viral copies this assay can detect is 131 copies/mL. A negative result does not preclude SARS-Cov-2 infection and should not be used as the sole basis for treatment or other patient management decisions. A negative result may occur with  improper specimen collection/handling, submission of specimen other than nasopharyngeal swab, presence of viral mutation(s) within the areas targeted by this assay, and inadequate number of viral copies (<131 copies/mL). A negative result must be combined with clinical observations, patient history, and epidemiological information. The expected result is Negative.  Fact Sheet for Patients:  PinkCheek.be  Fact Sheet for Healthcare Providers:  GravelBags.it  This test is no t yet approved or cleared by the [place] FDA and  has been authorized for detection and/or diagnosis of SARS-CoV-2 by FDA under an Emergency Use Authorization (EUA). This EUA will remain  in effect (meaning this test can be used) for the duration of the COVID-19 declaration under Section 564(b)(1) of the Act, 21 U.S.C. section 360bbb-3(b)(1), unless the authorization is terminated or revoked sooner.     Influenza A by PCR NEGATIVE NEGATIVE Final   Influenza B by PCR NEGATIVE NEGATIVE Final    Comment: (NOTE) The Xpert Xpress SARS-CoV-2/FLU/RSV assay is  intended as an aid in  the diagnosis of influenza from Nasopharyngeal swab specimens and  should not be used as a sole basis for treatment. Nasal washings and  aspirates are unacceptable for Xpert Xpress SARS-CoV-2/FLU/RSV  testing.  Fact Sheet for Patients: PinkCheek.be  Fact Sheet for Healthcare Providers: GravelBags.it  This test is not yet approved or cleared by the [place] FDA and  has been authorized for detection and/or diagnosis of SARS-CoV-2 by  FDA under an Emergency Use Authorization (EUA). This EUA will remain  in effect (meaning this test can be used) for the duration of the  Covid-19 declaration under Section 564(b)(1) of the Act, 21  U.S.C. section 360bbb-3(b)(1), unless the authorization is  terminated or revoked. Performed at [facility], [address] [place], [place] [address]   MRSA PCR Screening     Status: Abnormal   Collection Time: 10/06/20  9:59 AM   Specimen: Nasal Mucosa; Nasopharyngeal  Result Value Ref Range Status   MRSA by PCR POSITIVE (A) NEGATIVE Final    Comment:        The GeneXpert MRSA Assay (FDA approved for NASAL specimens only), is one component of a comprehensive MRSA colonization surveillance program. It is not intended to diagnose MRSA infection nor to guide or monitor treatment for MRSA infections. RESULT CALLED TO, READ BACK BY AND VERIFIED WITH: C RUECKEL,RN @0457  10/07/20 [facility] Performed at [facility], [address] [place], [place] [address]   Culture, respiratory (non-expectorated)     Status: None   Collection Time: 10/06/20  4:29 PM   Specimen: Tracheal Aspirate; Respiratory  Result Value Ref Range Status   Specimen Description   Final    TRACHEAL ASPIRATE Performed at [facility], [address] [place], [place] [address]    Special Requests   Final    NONE Performed at [facility], [address], [place], [place] [address]    Gram Stain    Final    ABUNDANT WBC PRESENT, PREDOMINANTLY PMN MODERATE GRAM POSITIVE COCCI RARE GRAM NEGATIVE RODS Performed at [facility], [place] [address] [place], [place] [address]    Culture RARE METHICILLIN RESISTANT STAPHYLOCOCCUS AUREUS  Final   Report Status 10/10/2020 FINAL  Final   Organism ID, Bacteria METHICILLIN RESISTANT STAPHYLOCOCCUS AUREUS  Final      Susceptibility   Methicillin resistant staphylococcus aureus - MIC*    CIPROFLOXACIN >=8 RESISTANT Resistant     ERYTHROMYCIN >=8 RESISTANT Resistant     GENTAMICIN <=0.5 SENSITIVE Sensitive     OXACILLIN >=4 RESISTANT Resistant     TETRACYCLINE <=1 SENSITIVE Sensitive     VANCOMYCIN 1 SENSITIVE Sensitive     TRIMETH/SULFA <=10 SENSITIVE Sensitive     CLINDAMYCIN <=0.25 SENSITIVE Sensitive     RIFAMPIN <=0.5 SENSITIVE Sensitive     Inducible Clindamycin NEGATIVE Sensitive     *  RARE METHICILLIN RESISTANT STAPHYLOCOCCUS AUREUS  Culture, blood (Routine X 2) w Reflex to ID Panel     Status: None (Preliminary result)   Collection Time: 10/08/20 11:27 AM   Specimen: BLOOD RIGHT HAND  Result Value Ref Range Status   Specimen Description BLOOD RIGHT HAND  Final   Special Requests   Final    BOTTLES DRAWN AEROBIC AND ANAEROBIC Blood Culture adequate volume   Culture   Final    NO GROWTH 4 DAYS Performed at [facility], [address], [place], [place] [address]    Report Status PENDING  Incomplete  Culture, blood (Routine X 2) w Reflex to ID Panel     Status: None (Preliminary result)   Collection Time: 10/08/20 11:36 AM   Specimen: BLOOD LEFT HAND  Result Value Ref Range Status   Specimen Description BLOOD LEFT HAND  Final   Special Requests   Final    BOTTLES DRAWN AEROBIC AND ANAEROBIC Blood Culture adequate volume   Culture   Final    NO GROWTH 4 DAYS Performed at [facility], [place] [address], [place], [place] [address]    Report Status PENDING  Incomplete     Signed: Marlowe Aschoff Shaye Elling  [facility] 10/12/2020, 4:13 PM

## 2020-10-12 NOTE — Progress Notes (Signed)
Physical Therapy Treatment Patient Details Name: Judith Sullivan MRN: [id number] DOB: [birth year] Today's Date: 10/12/2020    History of Present Illness This 59 y.o. female originally admitted to [facility] with vomiting and AMS secondary to possible overdose of medications as she frequently takes too much medication per spouse's report to ED provider.  MRI of brain 10/27 showed small acute to subacute infarcts along the bilateral posterior cerebral convexities, remote lacunar infarcts in the left corona radiata and bil. thalamus.  On 10/28 she was noted to have tonic/clonic seizures and was transferred to [facility] for higher level of care. On arrival at [facility], she was in respiratory distress possibly due to aspiration and was placed on BiPAP then intubated.  EEG LTM performed.  CT of head on 10/28 showed moderate area of edema along the Rt occipital parietal cortex which is presumably an infarct, Extubated 10/30.  PMH includes:  HTN, CTS, COPD, asthma with bronchitis, s/p shoulder surgery, s/p neck fusion    PT Comments    Focused today's session on LE muscular strengthening/endurance training as pt continues to be unable to stand/ambulate for increased periods of time secondary to pt's reports of bowel issues/diarrhea this date. Educated pt on performing LAQ, SLR, bridges in bed, and clamshells with each meal to prevent functional decline. Pt is scheduled for TEE procedure this morning, and thus will plan to follow-up acutely and with [facility] PT to continue to address her deficits mentioned below and address any possible changes in function to maximize her independence and safety with all functional mobility.   Follow Up Recommendations  Home health PT;Supervision for mobility/OOB     Equipment Recommendations  Rolling walker with 5" wheels;3in1 (PT) (TBD as pt progresses)    Recommendations for Other Services       Precautions / Restrictions Precautions Precautions: Fall Precaution Comments: watch  02 Restrictions Weight Bearing Restrictions: No    Mobility  Bed Mobility Overal bed mobility: Needs Assistance Bed Mobility: Supine to Sit;Sit to Supine;Rolling Rolling: Supervision   Supine to sit: Supervision Sit to supine: Supervision   General bed mobility comments: Pt able to transition supine <> sit and roll in bed in appropriate time frame without LOB, but utilizes bed rail. SUP for safety.  Transfers Overall transfer level: Needs assistance Equipment used: None Transfers: Sit to/from Stand Sit to Stand: Min guard         General transfer comment: Pt able to come to stand in appropriate amount of time, with min trunk sway noted. Min guard for safety,  Ambulation/Gait                 Stairs             Wheelchair Mobility    Modified Rankin (Stroke Patients Only) Modified Rankin (Stroke Patients Only) Pre-Morbid Rankin Score: No symptoms Modified Rankin: Moderately severe disability     Balance Overall balance assessment: Needs assistance Sitting-balance support: Feet supported;No upper extremity supported Sitting balance-Leahy Scale: Fair Sitting balance - Comments: supervision for safety   Standing balance support: No upper extremity supported Standing balance-Leahy Scale: Fair Standing balance comment: Pt quickly returns to sitting due to reports of bowel issues this date. Mild trunk sway noted.                            Cognition Arousal/Alertness: Awake/alert Behavior During Therapy: WFL for tasks assessed/performed Overall Cognitive Status: Within Functional Limits for tasks assessed  Exercises General Exercises - Lower Extremity Gluteal Sets: AROM;Strengthening;Both;10 reps;Supine (bridging in bed) Long Arc Quad: AROM;Strengthening;Seated;Both;15 reps Hip ABduction/ADduction: AROM;Strengthening;Both;10 reps;Sidelying (clamshells) Straight Leg Raises:  AROM;Strengthening;Both;10 reps;Supine    General Comments        Pertinent Vitals/Pain Pain Assessment: Faces Faces Pain Scale: Hurts a little bit Pain Location: stomach/bowels Pain Descriptors / Indicators: Grimacing Pain Intervention(s): Limited activity within patient's tolerance;Monitored during session    Home Living                      Prior Function            PT Goals (current goals can now be found in the care plan section) Acute Rehab PT Goals Patient Stated Goal: to go home and to feel better PT Goal Formulation: With patient Time For Goal Achievement: 10/19/20 Potential to Achieve Goals: Good Progress towards PT goals: Not progressing toward goals - comment (limited by bowel issues)    Frequency    Min 4X/week      PT Plan Current plan remains appropriate    Co-evaluation              AM-PAC PT "6 Clicks" Mobility   Outcome Measure  Help needed turning from your back to your side while in a flat bed without using bedrails?: A Little Help needed moving from lying on your back to sitting on the side of a flat bed without using bedrails?: A Little Help needed moving to and from a bed to a chair (including a wheelchair)?: A Little Help needed standing up from a chair using your arms (e.g., wheelchair or bedside chair)?: A Little Help needed to walk in hospital room?: A Little Help needed climbing 3-5 steps with a railing? : A Lot 6 Click Score: 17    End of Session Equipment Utilized During Treatment: Gait belt Activity Tolerance: Patient tolerated treatment well;Treatment limited secondary to medical complications (Comment) (limited by bowel discomfort/diarrhea) Patient left: in bed;with call bell/phone within reach;with bed alarm set;with nursing/sitter in room (sister present during session)   PT Visit Diagnosis: Unsteadiness on feet (R26.81);Other abnormalities of gait and mobility (R26.89);Muscle weakness (generalized)  (M62.81);Difficulty in walking, not elsewhere classified (R26.2)     Time: 0802-0819 PT Time Calculation (min) (ACUTE ONLY): 17 min  Charges:  $Therapeutic Exercise: 8-22 mins                     Raymond Gurney, PT, DPT Acute Rehabilitation Services  Pager: [phone] Office: [phone]    Jewel Baize 10/12/2020, 9:06 AM

## 2020-10-12 NOTE — Discharge Instructions (Signed)

## 2020-10-12 NOTE — Progress Notes (Signed)
  Echocardiogram Echocardiogram Transesophageal has been performed.  Janalyn Harder 10/12/2020, 1:22 PM

## 2020-10-12 NOTE — Interval H&P Note (Signed)
History and Physical Interval Note:  10/12/2020 12:32 PM  Judith Sullivan  has presented today for surgery, with the diagnosis of STROKE.  The various methods of treatment have been discussed with the patient and family. After consideration of risks, benefits and other options for treatment, the patient has consented to  Procedure(s): TRANSESOPHAGEAL ECHOCARDIOGRAM (TEE) (N/A) as a surgical intervention.  The patient's history has been reviewed, patient examined, no change in status, stable for surgery.  I have reviewed the patient's chart and labs.  Questions were answered to the patient's satisfaction.     [employer]

## 2020-10-12 NOTE — Progress Notes (Signed)
Pt has been discharged from the unit via wheelchair. All tele and Iv has been removed. AVS documentation has been given and reviewed. Pt denies any pain. Pt sent in good spirits

## 2020-10-12 NOTE — Anesthesia Procedure Notes (Signed)
Procedure Name: MAC Date/Time: 10/12/2020 12:54 PM Performed by: Kyung Rudd, CRNA Pre-anesthesia Checklist: Patient identified, Emergency Drugs available, Suction available and Patient being monitored Patient Re-evaluated:Patient Re-evaluated prior to induction Oxygen Delivery Method: Simple face mask Preoxygenation: Pre-oxygenation with 100% oxygen Induction Type: IV induction Placement Confirmation: positive ETCO2 Dental Injury: Teeth and Oropharynx as per pre-operative assessment

## 2020-10-12 NOTE — Anesthesia Postprocedure Evaluation (Signed)
Anesthesia Post Note  Patient: PEPPER KERRICK  Procedure(s) Performed: TRANSESOPHAGEAL ECHOCARDIOGRAM (TEE) (N/A ) BUBBLE STUDY     Patient location during evaluation: PACU Anesthesia Type: MAC Level of consciousness: awake and alert Pain management: pain level controlled Vital Signs Assessment: post-procedure vital signs reviewed and stable Respiratory status: spontaneous breathing, nonlabored ventilation, respiratory function stable and patient connected to nasal cannula oxygen Cardiovascular status: stable and blood pressure returned to baseline Postop Assessment: no apparent nausea or vomiting Anesthetic complications: no   No complications documented.  Last Vitals:  Vitals:   10/12/20 1330 10/12/20 1340  BP: (!) 159/65 (!) 181/78  Pulse: 83 82  Resp: (!) 21 (!) 21  Temp:    SpO2: 93% 93%    Last Pain:  Vitals:   10/12/20 1340  TempSrc:   PainSc: 0-No pain                 Effie Berkshire

## 2020-10-12 NOTE — TOC Transition Note (Signed)
Transition of Care [facility]) - CM/SW Discharge Note   Patient Details  Name: Judith Sullivan MRN: [id number] Date of Birth: [birth year]  Transition of Care [facility]) CM/SW Contact:  Kermit Balo, RN Phone Number: 10/12/2020, 4:33 PM   Clinical Narrative:    Pt discharging home with [facility] services through [facility].  Pt and spouse states they have needed DME at home.  Pt has supervision at home and transportation to home.   Final next level of care: Home w Home Health Services Barriers to Discharge: No Barriers Identified   Patient Goals and CMS Choice Patient states their goals for this hospitalization and ongoing recovery are:: home with [facility] CMS Medicare.gov Compare Post Acute Care list provided to:: Patient Represenative (must comment) Choice offered to / list presented to : Spouse  Discharge Placement                       Discharge Plan and Services     Post Acute Care Choice: Home Health                    HH Arranged: PT, OT, Speech Therapy HH Agency: [facility] Date [facility] Agency Contacted: 10/12/20   Representative spoke with at [facility] Agency: Amy  Social Determinants of Health (SDOH) Interventions     Readmission Risk Interventions No flowsheet data found.

## 2020-10-12 NOTE — CV Procedure (Signed)
   Transesophageal Echocardiogram  Indications: Stroke  Time out performed  Propofol with anesthesia  Findings:  Left Ventricle: Normal EF  Mitral Valve: Mild MR  Aortic Valve: Normal  Tricuspid Valve: Trace TR  Left Atrium: Normal, no LAA thrombus  Bubble Contrast Study: Very small bubble cross over, suggestive of small PFO.   LE Dopplers were negative for DVT  Donato Schultz, MD

## 2020-10-12 NOTE — Progress Notes (Signed)
STROKE TEAM PROGRESS NOTE   INTERVAL HISTORY Husband at bedside. Patient lying in bed, much awake alert, interactive. Fully orientated. Still has right wrist drop on wrist splint. Autoimmune labs so far negative. Some still pending. Patient is pending discharge and follow-up with Dr. Patel at the [facility].  Vitals:   10/12/20 1206 10/12/20 1320 10/12/20 1330 10/12/20 1340  BP: (!) 163/74 132/77 (!) 159/65 (!) 181/78  Pulse: 85 89 83 82  Resp: (!) 22 18 (!) 21 (!) 21  Temp: 98.3 F (36.8 C) 98 F (36.7 C)    TempSrc: Oral Oral    SpO2: 96% 93% 93% 93%  Weight: 45 kg     Height: 5' 3" (1.6 m)      CBC:  Recent Labs  Lab 10/11/20 0443 10/12/20 0238  WBC 11.9* 16.3*  HGB 14.6 15.7*  HCT 41.5 44.7  MCV 91.8 91.0  PLT 169 211   Basic Metabolic Panel:  Recent Labs  Lab 10/06/20 0936 10/07/20 1816 10/08/20 0446 10/09/20 1135 10/11/20 0443 10/12/20 0238  NA   < >  --  153*   < > 137 138  K   < >  --  3.9   < > 3.7 4.0  CL   < >  --  120*   < > 104 105  CO2   < >  --  24   < > 23 24  GLUCOSE   < >  --  188*   < > 179* 111*  BUN   < >  --  36*   < > 18 17  CREATININE   < >  --  0.90   < > 0.68 0.62  CALCIUM   < >  --  8.6*   < > 8.3* 8.9  MG   < > 2.2 2.0  --  1.9 1.9  PHOS  --  2.0* 2.6  --   --   --    < > = values in this interval not displayed.   Lipid Panel:  Recent Labs  Lab 10/06/20 0616 10/06/20 0616 10/06/20 1651  CHOL 199  --   --   TRIG 225*   < > 211*  HDL 43  --   --   CHOLHDL 4.6  --   --   VLDL 45*  --   --   LDLCALC 111*  --   --    < > = values in this interval not displayed.    IMAGING past 24 hours No results found.   PHYSICAL EXAM  Temp:  [97.8 F (36.6 C)-98.4 F (36.9 C)] 98 F (36.7 C) (11/03 1320) Pulse Rate:  [65-90] 82 (11/03 1340) Resp:  [16-22] 21 (11/03 1340) BP: (132-181)/(65-85) 181/78 (11/03 1340) SpO2:  [93 %-100 %] 93 % (11/03 1340) Weight:  [45 kg] 45 kg (11/03 1206)  General - Well nourished, well  developed, in no apparent distress.  Ophthalmologic - fundi not visualized due to noncooperation.  Cardiovascular - Regular rhythm and rate.  Mental Status -  Level of arousal and orientation to place (hospital), age, time and person were intact. Language including expression, naming, repetition, comprehension was assessed and found intact.  Cranial Nerves II - XII - II - Visual field intact OU. III, IV, VI - Extraocular movements intact. V - Facial sensation intact bilaterally. VII - Facial movement intact bilaterally. VIII - Hearing & vestibular intact bilaterally. X - Palate elevates symmetrically. XI - Chin turning & shoulder   shrug intact bilaterally. XII - Tongue protrusion intact.  Motor Strength - The patient's strength was 3+/5 BUE and 3/5 BLEs, and pronator drift was absent. However, chronic right wrist drop present.  Bulk was decreased bilaterally and fasciculations were absent.   Motor Tone - Muscle tone was assessed at the neck and appendages and was normal.  Reflexes - The patient's reflexes were symmetrical in all extremities and she had no pathological reflexes.  Sensory - Light touch, temperature/pinprick were assessed and were symmetrical.    Coordination - The patient had normal movements in the hands with no ataxia or dysmetria.  Tremor was absent.  Gait and Station - deferred.   ASSESSMENT/PLAN Ms. Judith Sullivan is a 59 y.o. female with history of chronic pain, morbid obesity, tendency to overmedicate admitted for acute encephalopathy and witnessed seizure found to have stroke and subsequently transferred from OSH to [place] for higher level of care.   Stroke:   Right posterior MCA, punctate b/l MCA/PCA, right MCA/PCA, right MCA/ACA infarcts secondary unknown source.   CT head 10/26 No acute abnormality.   MRI 10/27 Few small B posterior cerebral convexity infarcts and remote lacunes in L corona radiata and B thalami  CT head  10/28 new R occipitoparietal hypodensity, chronic infarcts  CTA head & neck 10/28 no LVO. Diffuse atherosclerosis throughout. High-grade R PCA branch stenosis   2D Echo EF 60-65%. No source of embolus   TEE 11/3 at 0945 neg for endocarditis. + bubble suggestive of very small PFO.  Loop recorder placed  LE Venous Dopplers - neg  LDL 111  HgbA1c 6.5  UDS - negative  VTE prophylaxis - Lovenox 30 mg sq daily   No antithrombotic prior to admission, now on aspirin 325 mg daily. Add plavix at d/c. Continue DAPT x 3 months then aspirin alone.    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  Fever and leukocytosis - probable aspiration PNA  T Max - 100.4->98.2->100.3->afebrile  Leukocytosis - 8.5->11.8->13.7->16.1->11.9->16.3 (Decadron tapering)  Sputum culture - rare MRSA  IV Unasyn 10/29>>  (for 5-7d course)  vanco 10/30>>10/31  Blood culture - 10/30 -  NGTD   Hypertensive Emergency  BP as high as 228/191  Stable now   On metoprolol . Long-term BP goal normotensive . Avoid low BP due to high-grade R PCA branch stenosis   Hyperlipidemia  Home meds:  No statin  LDL 111, goal < 70  Now on lipitor 40  Continue statin at discharge  Diabetes type II, controlled  HgbA1c 6.5, goal <  7.0  CBGs  SSI  On Levemir  PCP follow up  Dysphagia . Secondary to stroke . Passed swallow post extubation . On diet now . Speech on board  R wrist drop  Per daughter, it has been several weeks  Has appointment with Dr. Patel on 11/21/20    Needs outpt EMG/NCS with Dr. Patel  Put on wrist splint  Continue PT/OT  Discussed with Dr. Patel from [facility] - ESR, CRP, hepatitis panel - normal; ANCA, cryoglobulin, and ANA pending   Hypernatremia, resolved    Na 142->137  Was on FW 200 Q4h and 1/2NS -> now off  On diet  Encourage po intake  Tobacco abuse  Current smoker  Smoking cessation counseling provided  Pt is willing to quit  Other Stroke Risk Factors  Family hx stroke (mother)  Other Active Problems  Vit B 12 deficiency - on supplement  Elevated Hgb, now 14.6 - resolved  Chronic pain  Hospital day # 7  Neurology will sign off. Please call with questions. Pt will follow up with Dr. Patel at [facility] in about 3-4 weeks. Thanks for the consult.  Jindong Xu, MD PhD Stroke Neurology 10/12/2020 2:56 PM   To contact Stroke Continuity provider, please refer to [email or web address]. After hours, contact General Neurology 

## 2020-10-12 NOTE — Transfer of Care (Signed)
Immediate Anesthesia Transfer of Care Note  Patient: Judith Sullivan  Procedure(s) Performed: TRANSESOPHAGEAL ECHOCARDIOGRAM (TEE) (N/A ) BUBBLE STUDY  Patient Location: Endoscopy Unit  Anesthesia Type:MAC  Level of Consciousness: awake, alert  and oriented  Airway & Oxygen Therapy: Patient Spontanous Breathing and Patient connected to nasal cannula oxygen  Post-op Assessment: Report given to RN, Post -op Vital signs reviewed and stable and Patient moving all extremities X 4  Post vital signs: Reviewed and stable  Last Vitals:  Vitals Value Taken Time  BP 132/77 10/12/20 1320  Temp 36.7 C 10/12/20 1320  Pulse 81 10/12/20 1324  Resp 25 10/12/20 1324  SpO2 94 % 10/12/20 1324  Vitals shown include unvalidated device data.  Last Pain:  Vitals:   10/12/20 1320  TempSrc: Oral  PainSc: 0-No pain         Complications: No complications documented.

## 2020-10-12 NOTE — Consult Note (Addendum)
ELECTROPHYSIOLOGY CONSULT NOTE  Patient ID: Judith Sullivan MRN: [id number], DOB/AGE: 58-Mar-1962   Admit date: 10/04/2020 Date of Consult: 10/12/2020  Primary Physician: Glenda Chroman, MD Primary Cardiologist: No primary care provider on file.  Primary Electrophysiologist: New to Dr. Lovena Le Reason for Consultation: Cryptogenic stroke; recommendations regarding Implantable Loop Recorder Insurance: [facility]  History of Present Illness EP has been asked to evaluate Judith Sullivan for placement of an implantable loop recorder to monitor for atrial fibrillation by Dr Erlinda Hong.  The patient was admitted on 10/04/2020 with acute encephalopathy and witnessed seizure, found to have stroke and transferred from [facility] to [facility] for higher level of care.  Imaging demonstrated: CT head 10/26 No acute abnormality.  MRI 10/27 Few small B posterior cerebral convexity infarcts and remote lacunes in L corona radiata and B thalami CT head 10/28 new R occipitoparietal hypodensity, chronic infarcts CTA head & neck 10/28 no LVO. Diffuse atherosclerosis throughout. High-grade R PCA branch stenosis   The patient has been monitored on telemetry which has demonstrated sinus rhythm with no arrhythmias.  Inpatient stroke work-up Judith Sullivan require a TEE per Neurology.   Echocardiogram this admission demonstrated 60-65% without source of embolus.  Lab work is reviewed.  Prior to admission, the patient denies chest pain, shortness of breath, dizziness, palpitations, or syncope.  They are recovering from their stroke with plans to return home  at discharge.  Past Medical History:  Diagnosis Date   Asthma with bronchitis    Backache    COPD (chronic obstructive pulmonary disease) (HCC)    CTS (carpal tunnel syndrome)    Hypertension    Kidney stones      Surgical History:  Past Surgical History:  Procedure Laterality Date   CESAREAN SECTION     CHOLECYSTECTOMY     kidney stone removal     neck fusion     SHOULDER SURGERY        Medications Prior to Admission  Medication Sig Dispense Refill Last Dose   amitriptyline (ELAVIL) 25 MG tablet Take 25 mg by mouth at bedtime.   unknown   gabapentin (NEURONTIN) 800 MG tablet Take 800 mg by mouth 3 (three) times daily.      oxyCODONE (ROXICODONE) 15 MG immediate release tablet Take 15 mg by mouth 4 (four) times daily as needed.      valACYclovir (VALTREX) 1000 MG tablet Take 1,000 mg by mouth daily.      levofloxacin (LEVAQUIN) 500 MG tablet Take 500 mg by mouth daily. (Patient not taking: Reported on 10/04/2020)   Completed Course at Unknown time   lisinopril (ZESTRIL) 20 MG tablet       metFORMIN (GLUCOPHAGE) 500 MG tablet Take by mouth 2 (two) times daily with a meal. Take 2 po qam and 1 po qhs.      metoprolol tartrate (LOPRESSOR) 50 MG tablet       predniSONE (DELTASONE) 5 MG tablet Take by mouth. (Patient not taking: Reported on 10/04/2020)   Completed Course at Unknown time   sitaGLIPtin (JANUVIA) 100 MG tablet Take 100 mg by mouth daily.      tiZANidine (ZANAFLEX) 4 MG tablet Take 4 mg by mouth every 8 (eight) hours as needed.       Inpatient Medications:   aspirin  300 mg Rectal Daily   Or   aspirin  325 mg Oral Daily   atorvastatin  40 mg Oral Daily   chlorhexidine gluconate (MEDLINE KIT)  15 mL Mouth Rinse BID  cholecalciferol  1,000 Units Oral Daily   cyanocobalamin  1,000 mcg Intramuscular Daily   dexamethasone (DECADRON) injection  4 mg Intravenous Q12H   enoxaparin (LOVENOX) injection  30 mg Subcutaneous Q24H   feeding supplement  237 mL Oral TID BM   insulin aspart  0-15 Units Subcutaneous TID AC & HS   insulin detemir  8 Units Subcutaneous QHS   levETIRAcetam  500 mg Oral BID   loratadine  10 mg Oral Daily   metoprolol tartrate  25 mg Oral BID   multivitamin with minerals  1 tablet Oral Daily    Allergies: No Known Allergies  Social History   Socioeconomic History   Marital status: Married    Spouse name: Not on file   Number of  children: 2   Years of education: 13   Highest education level: Not on file  Occupational History   Occupation: hairdresser  Tobacco Use   Smoking status: Current Every Day Smoker    Packs/day: 1.00    Years: 35.00    Pack years: 35.00   Smokeless tobacco: Never Used  Scientific laboratory technician Use: Never used  Substance and Sexual Activity   Alcohol use: Yes    Comment: very rarely   Drug use: Not Currently   Sexual activity: Not on file  Other Topics Concern   Not on file  Social History Narrative   Lives with husband in a one story home.  Has 2 children.  Works as a Theme park manager.  Education: 13 years.        Left handed   Social Determinants of Health   Financial Resource Strain:    Difficulty of Paying Living Expenses: Not on file  Food Insecurity:    Worried About Judith Sullivan in the Last Year: Not on file   Ran Out of Food in the Last Year: Not on file  Transportation Needs:    Lack of Transportation (Medical): Not on file   Lack of Transportation (Non-Medical): Not on file  Physical Activity:    Days of Exercise per Week: Not on file   Minutes of Exercise per Session: Not on file  Stress:    Feeling of Stress : Not on file  Social Connections:    Frequency of Communication with Friends and Family: Not on file   Frequency of Social Gatherings with Friends and Family: Not on file   Attends Religious Services: Not on file   Active Member of Clubs or Organizations: Not on file   Attends Archivist Meetings: Not on file   Marital Status: Not on file  Intimate Partner Violence:    Fear of Current or Ex-Partner: Not on file   Emotionally Abused: Not on file   Physically Abused: Not on file   Sexually Abused: Not on file     Family History  Problem Relation Age of Onset   Stroke Mother    Hypertension Mother    Heart attack Father       Review of Systems: All other systems reviewed and are otherwise negative except as noted above.  Physical  Exam: Vitals:   10/11/20 1924 10/12/20 0015 10/12/20 0424 10/12/20 0857  BP: (!) 156/85 (!) 173/82 (!) 149/74 134/70  Pulse: 81 65 73 88  Resp: _0 Temp: 98.2 F (36.8 C) 97.8 F (36.6 C) 98 F (36.7 C) 98.4 F (36.9 C)  TempSrc: Oral Oral Oral Oral  SpO2: 96% 96% 99% 100%  Weight:  Height:        GEN- The patient is well appearing, alert and oriented x 3 today.   Head- normocephalic, atraumatic Eyes-  Sclera clear, conjunctiva pink Ears- hearing intact Oropharynx- clear Neck- supple Lungs- Clear to ausculation bilaterally, normal work of breathing Heart- Regular rate and rhythm, no murmurs, rubs or gallops  GI- soft, NT, ND, + BS Extremities- no clubbing, cyanosis, or edema MS- no significant deformity or atrophy Skin- no rash or lesion Psych- euthymic mood, full affect   Labs:   Lab Results  Component Value Date   WBC 16.3 (H) 10/12/2020   HGB 15.7 (H) 10/12/2020   HCT 44.7 10/12/2020   MCV 91.0 10/12/2020   PLT 211 10/12/2020    Recent Labs  Lab 10/09/20 1135 10/11/20 0443 10/12/20 0238  NA 142   < > 138  K 4.7   < > 4.0  CL 106   < > 105  CO2 27   < > 24  BUN 23*   < > 17  CREATININE 0.83   < > 0.62  CALCIUM 9.2   < > 8.9  PROT 5.5*  --   --   BILITOT 0.8  --   --   ALKPHOS 37*  --   --   ALT 20  --   --   AST 19  --   --   GLUCOSE 178*   < > 111*   < > = values in this interval not displayed.     Radiology/Studies: EEG  Result Date: 10/06/2020 Lora Havens, MD     10/06/2020  9:02 PM Patient Name: Judith Sullivan MRN: [id number] Epilepsy Attending: Lora Havens Referring Physician/Provider: Dr Tennis Must Date: 10/06/2020 Duration: Patient history: 59yo F with acute BL cerebra infarcts and ams. EEG to evaluate for seizure. Level of alertness:  comatose AEDs during EEG study: Propofol Technical aspects: This EEG study was done with scalp electrodes positioned according to the 10-20 International system of electrode placement.  Electrical activity was acquired at a sampling rate of _0  and reviewed with a high frequency filter of _1  and a low frequency filter of _2 . EEG data were recorded continuously and digitally stored. Description: EEG showed intermittent generalized 6-_3  theta-alpha activity as well as generalized 15-_4  beta activiy. Brief periods of generalized eeg attenuation were also noted. Multiple left and right temporal sharp transients were seen. Hyperventilation and photic stimulation were not performed.   ABNORMALITY - Intermittent slow, generalized - Background attenuation, generalized IMPRESSION: This study is suggestive of profound diffuse encephalopathy, nonspecific etiology but likely related to sedation. No seizures or definite epileptiform discharges were seen throughout the recording. If suspicion for interictal/ictal activity remains a concern, a prolonged study can be considered. Lora Havens   CT HEAD WO CONTRAST  Result Date: 10/06/2020 CLINICAL DATA:  Seizure with abnormal neuro exam EXAM: CT HEAD WITHOUT CONTRAST TECHNIQUE: Contiguous axial images were obtained from the base of the skull through the vertex without intravenous contrast. COMPARISON:  Brain MRI from yesterday FINDINGS: Brain: New area of moderate edema in the right occipital parietal cortex. No acute hemorrhage, hydrocephalus, or collection. By MRI there are small acute to subacute infarcts and there are chronic lacunar infarcts at the bilateral thalami and left corona radiata/external capsule. No bilaterality to implicate posterior reversible encephalopathy syndrome. No dural sinus thrombosis on preceding brain MRI. Suggest repeat brain MRI. Vascular: No hyperdense vessel. The dural sinuses were patent on preceding brain MRI. Skull: Negative  Sinuses/Orbits: Negative Other: A call has been placed to the ordering provider. IMPRESSION: Moderate area of edema along the right occipital parietal cortex which has occurred since brain  MRI yesterday, presumably interval infarct. The infarcts are somewhat clustered posteriorly, consider CTA to evaluate the posterior circulation. The dural sinuses were patent on the preceding brain MRI with contrast. Electronically Signed   By: Jonathon  Watts M.D.   On: 10/06/2020 05:10   CT HEAD WO CONTRAST  Result Date: 10/04/2020 CLINICAL DATA:  Altered mental status. EXAM: CT HEAD WITHOUT CONTRAST TECHNIQUE: Contiguous axial images were obtained from the base of the skull through the vertex without intravenous contrast. COMPARISON:  Brain MRI 10/23/2018. FINDINGS: Brain: No evidence of acute infarction, hemorrhage, hydrocephalus, extra-axial collection or mass lesion/mass effect. Vascular: Atherosclerosis.  No hyperdense vessel. Skull: Intact.  No focal lesion. Sinuses/Orbits: Negative. Other: None. IMPRESSION: No acute abnormality. Atherosclerosis. Electronically Signed   By: Thomas  Dalessio M.D.   On: 10/04/2020 11:51   MR ANGIO HEAD WO CONTRAST  Result Date: 10/05/2020 CLINICAL DATA:  Mental status change with unknown cause EXAM: MRI HEAD WITHOUT AND WITH CONTRAST MRA HEAD WITHOUT CONTRAST TECHNIQUE: Multiplanar, multiecho pulse sequences of the brain and surrounding structures were obtained without and with intravenous contrast. Angiographic images of the head were obtained using MRA technique without contrast. CONTRAST:  4mL GADAVIST GADOBUTROL 1 MMOL/ML IV SOLN COMPARISON:  Head CT from yesterday FINDINGS: MRI HEAD FINDINGS Brain: Punctate acute to subacute infarcts in the bilateral occipital cortex (see coronal diffusion), bilateral parietal white matter, and right posterior frontal white matter. Chronic lacunes are seen in the left corona radiata and bilateral thalamus. No acute hemorrhage, hydrocephalus, or masslike finding. No abnormal intracranial enhancement. Vascular: Normal flow voids Skull and upper cervical spine: Normal marrow signal Sinuses/Orbits: Negative MRA HEAD FINDINGS The  vertebral and basilar arteries are smooth and widely patent. Symmetric carotid siphons which are diffusely patent. There is presumably atheromatous (given vascular risk factors) irregularities of bilateral MCA and PCA branches. A left M2 branch appears cut off on the reformats but there are small emanating vessels on source images. The stenoses are moderate to advanced in severity. Negative for aneurysm IMPRESSION: Brain MRI: 1. 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 cerebral convexities. 2. Remote lacunar infarcts in the left corona radiata and bilateral thalamus. 3. Motion degraded. Intracranial MRA: Multifocal medium-sized vessel narrowings, presumably atheromatous. Most notable is a severe proximal left M2 branch stenosis. Electronically Signed   By: Jonathon  Watts M.D.   On: 10/05/2020 11:28   MR BRAIN W WO CONTRAST  Result Date: 10/05/2020 CLINICAL DATA:  Mental status change with unknown cause EXAM: MRI HEAD WITHOUT AND WITH CONTRAST MRA HEAD WITHOUT CONTRAST TECHNIQUE: Multiplanar, multiecho pulse sequences of the brain and surrounding structures were obtained without and with intravenous contrast. Angiographic images of the head were obtained using MRA technique without contrast. CONTRAST:  4mL GADAVIST GADOBUTROL 1 MMOL/ML IV SOLN COMPARISON:  Head CT from yesterday FINDINGS: MRI HEAD FINDINGS Brain: Punctate acute to subacute infarcts in the bilateral occipital cortex (see coronal diffusion), bilateral parietal white matter, and right posterior frontal white matter. Chronic lacunes are seen in the left corona radiata and bilateral thalamus. No acute hemorrhage, hydrocephalus, or masslike finding. No abnormal intracranial enhancement. Vascular: Normal flow voids Skull and upper cervical spine: Normal marrow signal Sinuses/Orbits: Negative MRA HEAD FINDINGS The vertebral and basilar arteries are smooth and widely patent. Symmetric carotid siphons which are diffusely patent.  There is   presumably atheromatous (given vascular risk factors) irregularities of bilateral MCA and PCA branches. A left M2 branch appears cut off on the reformats but there are small emanating vessels on source images. The stenoses are moderate to advanced in severity. Negative for aneurysm IMPRESSION: Brain MRI: 1. 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 cerebral convexities. 2. Remote lacunar infarcts in the left corona radiata and bilateral thalamus. 3. Motion degraded. Intracranial MRA: Multifocal medium-sized vessel narrowings, presumably atheromatous. Most notable is a severe proximal left M2 branch stenosis. Electronically Signed   By: Monte Fantasia M.D.   On: 10/05/2020 11:28   US Carotid Bilateral (at [facility] and [facility] only)  Result Date: 10/05/2020 CLINICAL DATA:  Hypertension, stroke symptoms, diabetes EXAM: BILATERAL CAROTID DUPLEX ULTRASOUND TECHNIQUE: Pearline Cables scale imaging, color Doppler and duplex ultrasound were performed of bilateral carotid and vertebral arteries in the neck. COMPARISON:  None. FINDINGS: Criteria: Quantification of carotid stenosis is based on velocity parameters that correlate the residual internal carotid diameter with NASCET-based stenosis levels, using the diameter of the distal internal carotid lumen as the denominator for stenosis measurement. The following velocity measurements were obtained: RIGHT ICA: 64/14 cm/sec CCA: 19/50 cm/sec SYSTOLIC ICA/CCA RATIO:  0.7 ECA: 378 cm/sec LEFT Unable to perform the left carotid portion of the exam because the patient is not cooperative and unable to extend in the left neck for imaging. RIGHT CAROTID ARTERY: Minor echogenic shadowing plaque formation. No hemodynamically significant right ICA stenosis, velocity elevation, or turbulent flow. Degree of narrowing less than 50%. RIGHT VERTEBRAL ARTERY:  Normal antegrade flow LEFT CAROTID ARTERY:  Not performed LEFT VERTEBRAL ARTERY:  Not performed IMPRESSION: Bilateral  carotid atherosclerosis. No hemodynamically significant right ICA stenosis. Degree of narrowing less than 50% by ultrasound criteria. Normal antegrade right vertebral artery. Unable to perform the left carotid and vertebral artery portions of the exam. Electronically Signed   By: Jerilynn Mages.  Shick M.D.   On: 10/05/2020 15:07   DG CHEST PORT 1 VIEW  Result Date: 10/08/2020 CLINICAL DATA:  Acute encephalopathy EXAM: PORTABLE CHEST 1 VIEW COMPARISON:  10/07/2020 FINDINGS: Endotracheal and partially imaged enteric tubes again identified. Slight elevation of the left hemidiaphragm. No new consolidation or edema. No pleural effusion. No pneumothorax. Stable heart size. IMPRESSION: Stable lines and tubes.  Lungs remain clear. Electronically Signed   By: Macy Mis M.D.   On: 10/08/2020 07:18   Portable Chest xray  Result Date: 10/07/2020 CLINICAL DATA:  Intubation. EXAM: PORTABLE CHEST 1 VIEW COMPARISON:  Chest x-ray 10/06/2020. FINDINGS: Endotracheal tube and NG tube in stable position. Heart size normal. Lungs are clear. Mild basilar pleural thickening again noted consistent scarring. No pneumothorax. Surgical clips right upper quadrant. Mild thoracic spine scoliosis concave left. IMPRESSION: 1. Lines and tubes in stable position. 2. No acute cardiopulmonary disease. Electronically Signed   By: Marcello Moores  Register   On: 10/07/2020 05:32   Portable Chest x-ray  Result Date: 10/06/2020 CLINICAL DATA:  Intubation. EXAM: PORTABLE CHEST 1 VIEW COMPARISON:  Earlier film, same date. FINDINGS: The endotracheal tube is 3.6 cm above the carina. The NG tube is coursing down the esophagus and into the stomach. The cardiac silhouette, mediastinal and hilar contours are normal. The lungs are clear.  No pleural effusions or pneumothorax. The bony thorax is intact. IMPRESSION: 1. Endotracheal tube and NG tubes in good position. 2. No acute cardiopulmonary findings. Electronically Signed   By: Marijo Sanes M.D.   On: 10/06/2020  16:53   DG CHEST PORT  1 VIEW  Result Date: 10/06/2020 CLINICAL DATA:  Seizure.  Possible aspiration. EXAM: PORTABLE CHEST 1 VIEW COMPARISON:  10/04/2020. FINDINGS: 0426 hours. Lungs are hyperexpanded. The lungs are clear without focal pneumonia, edema, pneumothorax or pleural effusion. The cardiopericardial silhouette is within normal limits for size. The visualized bony structures of the thorax show no acute abnormality. Telemetry leads overlie the chest. IMPRESSION: No active disease. Electronically Signed   By: Misty Stanley M.D.   On: 10/06/2020 05:04   DG CHEST PORT 1 VIEW  Result Date: 10/04/2020 CLINICAL DATA:  Metabolic encephalopathy.  Smoker. EXAM: PORTABLE CHEST 1 VIEW COMPARISON:  Report 03/12/2018 FINDINGS: Numerous leads and wires project over the chest. Cervical spine fixation. Apical lordotic positioning. Midline trachea. Normal heart size. No pleural effusion or pneumothorax. Moderate hyperinflation. Clear lungs. IMPRESSION: Hyperinflation, without acute disease. Electronically Signed   By: Abigail Miyamoto M.D.   On: 10/04/2020 19:43   Overnight EEG with video  Result Date: 10/07/2020 Lora Havens, MD     10/08/2020  9:03 AM Patient Name: Judith Sullivan MRN: [id number] Epilepsy Attending: Lora Havens Referring Physician/Provider: Dr Zeb Comfort Duration: 10/06/2020 2055 to 10/29/221 2055   Patient history: 59yo F with acute BL cerebra infarcts and ams. EEG to evaluate for seizure.   Level of alertness:  comatose--->awake, asleep   AEDs during EEG study: Propofol   Technical aspects: This EEG study was done with scalp electrodes positioned according to the 10-20 International system of electrode placement. Electrical activity was acquired at a sampling rate of _0  and reviewed with a high frequency filter of _1  and a low frequency filter of _2 . EEG data were recorded continuously and digitally stored.   Description: EEG initially showed continuous generalized 2-_3  delta  slowing with overriding 15-_4  beta activity which at times appears sharply contoured. After propofol was stopped, EEG showed polymorphic disorganized 8-_5  generalized alpha activity as well as intermittent generalized 5-_6  theta and 13-_7  beta activity. Sleep was characterized by  Vertex waves, sleep spindles (12-_8 ), maximal frontocentral region. Hyperventilation and photic stimulation were not performed.     ABNORMALITY - Continuous slow, generalized   IMPRESSION: This study was initially suggestive of severe diffuse encephalopathy, nonspecific etiology but likely related to sedation. After propofol ws stopped, eeg showed mild to moderate diffuse encephalopathy, non specific to etiology. No seizures or definite epileptiform discharges were seen throughout the recording.   Lora Havens    ECHOCARDIOGRAM COMPLETE  Result Date: 10/05/2020    ECHOCARDIOGRAM REPORT   Patient Name:   Judith Sullivan Date of Exam: 10/05/2020 Medical Rec #:  [id number]       Height:       60.0 in Accession #:    [id number]      Weight:       89.3 lb Date of Birth:  08-20-1961        BSA:          1.325 m Patient Age:    22 years        BP:           172/86 mmHg Patient Gender: F               HR:           85 bpm. Exam Location:  [facility] Procedure: 2D Echo, Cardiac Doppler and Color Doppler Indications:    Stroke 434.91 / I163.9  History:        Patient has no prior history of  Echocardiogram examinations.                 COPD; Risk Factors:Hypertension. AMS (altered mental status).  Sonographer:    Alvino Chapel RCS Referring Phys: Barton Dubois  Sonographer Comments: Technically difficult study due to poor echo windows. IMPRESSIONS  1. Left ventricular ejection fraction, by estimation, is 60 to 65%. The left ventricle has normal function. The left ventricle has no regional wall motion abnormalities. There is mild left ventricular hypertrophy. Left ventricular diastolic parameters are consistent with Grade I diastolic  dysfunction (impaired relaxation).  2. Right ventricular systolic function is normal. The right ventricular size is normal.  3. The mitral valve is normal in structure. No evidence of mitral valve regurgitation. No evidence of mitral stenosis.  4. The aortic valve has an indeterminant number of cusps. Aortic valve regurgitation is not visualized. No aortic stenosis is present.  5. The inferior vena cava is normal in size with greater than 50% respiratory variability, suggesting right atrial pressure of 3 mmHg. FINDINGS  Left Ventricle: Left ventricular ejection fraction, by estimation, is 60 to 65%. The left ventricle has normal function. The left ventricle has no regional wall motion abnormalities. The left ventricular internal cavity size was normal in size. There is  mild left ventricular hypertrophy. Left ventricular diastolic parameters are consistent with Grade I diastolic dysfunction (impaired relaxation). Normal left ventricular filling pressure. Right Ventricle: The right ventricular size is normal. No increase in right ventricular wall thickness. Right ventricular systolic function is normal. Left Atrium: Left atrial size was normal in size. Right Atrium: Right atrial size was normal in size. Pericardium: There is no evidence of pericardial effusion. Mitral Valve: The mitral valve is normal in structure. No evidence of mitral valve regurgitation. No evidence of mitral valve stenosis. Tricuspid Valve: The tricuspid valve is normal in structure. Tricuspid valve regurgitation is not demonstrated. No evidence of tricuspid stenosis. Aortic Valve: The aortic valve has an indeterminant number of cusps. Aortic valve regurgitation is not visualized. No aortic stenosis is present. Aortic valve mean gradient measures 4.5 mmHg. Aortic valve peak gradient measures 9.2 mmHg. Aortic valve area, by VTI measures 1.92 cm. Pulmonic Valve: The pulmonic valve was not well visualized. Pulmonic valve regurgitation is not  visualized. No evidence of pulmonic stenosis. Aorta: The aortic root is normal in size and structure. Pulmonary Artery: Indeterminant PASP, inadequate TR jet. Venous: The inferior vena cava is normal in size with greater than 50% respiratory variability, suggesting right atrial pressure of 3 mmHg. IAS/Shunts: No atrial level shunt detected by color flow Doppler.  LEFT VENTRICLE PLAX 2D LVIDd:         2.25 cm  Diastology LVIDs:         1.62 cm  LV e' medial:    6.96 cm/s LV PW:         1.13 cm  LV E/e' medial:  10.6 LV IVS:        1.12 cm  LV e' lateral:   6.20 cm/s LVOT diam:     1.70 cm  LV E/e' lateral: 11.9 LV SV:         49 LV SV Index:   37 LVOT Area:     2.27 cm  RIGHT VENTRICLE RV S prime:     13.70 cm/s TAPSE (M-mode): 1.7 cm LEFT ATRIUM             Index       RIGHT ATRIUM  Index LA diam:        2.70 cm 2.04 cm/m  RA Area:     5.33 cm LA Vol (A2C):   22.8 ml 17.21 ml/m RA Volume:   6.79 ml  5.12 ml/m LA Vol (A4C):   19.4 ml 14.64 ml/m LA Biplane Vol: 22.3 ml 16.83 ml/m  AORTIC VALVE AV Area (Vmax):    1.63 cm AV Area (Vmean):   1.64 cm AV Area (VTI):     1.92 cm AV Vmax:           152.01 cm/s AV Vmean:          99.790 cm/s AV VTI:            0.256 m AV Peak Grad:      9.2 mmHg AV Mean Grad:      4.5 mmHg LVOT Vmax:         109.00 cm/s LVOT Vmean:        71.900 cm/s LVOT VTI:          0.217 m LVOT/AV VTI ratio: 0.85  AORTA Ao Root diam: 2.60 cm MITRAL VALVE MV Area (PHT): 2.58 cm     SHUNTS MV Decel Time: 294 msec     Systemic VTI:  0.22 m MV E velocity: 73.70 cm/s   Systemic Diam: 1.70 cm MV A velocity: 119.00 cm/s MV E/A ratio:  0.62 Carlyle Dolly MD Electronically signed by Carlyle Dolly MD Signature Date/Time: 10/05/2020/4:14:53 PM    Final    VAS [place] LOWER EXTREMITY VENOUS (DVT)  Result Date: 10/08/2020  Lower Venous DVT Study Indications: Stroke.  Comparison Study: No prior study Performing Technologist: Sharion Dove RVS  Examination Guidelines: A complete evaluation  includes B-mode imaging, spectral Doppler, color Doppler, and power Doppler as needed of all accessible portions of each vessel. Bilateral testing is considered an integral part of a complete examination. Limited examinations for reoccurring indications may be performed as noted. The reflux portion of the exam is performed with the patient in reverse Trendelenburg.  +---------+---------------+---------+-----------+----------+-------------------+ RIGHT    CompressibilityPhasicitySpontaneityPropertiesThrombus Aging      +---------+---------------+---------+-----------+----------+-------------------+ CFV      Full                                         pulsatile waveforms                                                       noted               +---------+---------------+---------+-----------+----------+-------------------+ SFJ      Full                                                             +---------+---------------+---------+-----------+----------+-------------------+ FV Prox  Full                                                             +---------+---------------+---------+-----------+----------+-------------------+  FV Mid   Full                                                             +---------+---------------+---------+-----------+----------+-------------------+ FV DistalFull                                                             +---------+---------------+---------+-----------+----------+-------------------+ PFV      Full                                                             +---------+---------------+---------+-----------+----------+-------------------+ POP      Full                                         pulsatile waveforms                                                       noted               +---------+---------------+---------+-----------+----------+-------------------+ PTV      Full                                                              +---------+---------------+---------+-----------+----------+-------------------+ PERO     Full                                                             +---------+---------------+---------+-----------+----------+-------------------+   +---------+---------------+---------+-----------+----------+-------------------+ LEFT     CompressibilityPhasicitySpontaneityPropertiesThrombus Aging      +---------+---------------+---------+-----------+----------+-------------------+ CFV      Full                                         pulsatile waveforms                                                       noted               +---------+---------------+---------+-----------+----------+-------------------+ SFJ      Full                                                             +---------+---------------+---------+-----------+----------+-------------------+  FV Prox  Full                                                             +---------+---------------+---------+-----------+----------+-------------------+ FV Mid   Full                                                             +---------+---------------+---------+-----------+----------+-------------------+ FV DistalFull                                                             +---------+---------------+---------+-----------+----------+-------------------+ PFV      Full                                                             +---------+---------------+---------+-----------+----------+-------------------+ POP      Full                                         pulsatile waveforms                                                       noted               +---------+---------------+---------+-----------+----------+-------------------+ PTV      Full                                                              +---------+---------------+---------+-----------+----------+-------------------+ PERO     Full                                                             +---------+---------------+---------+-----------+----------+-------------------+    Summary: BILATERAL: - No evidence of deep vein thrombosis seen in the lower extremities, bilaterally. - RIGHT: pulsatile waveforms noted  LEFT: Pulsatile waveforms noted.  *See table(s) above for measurements and observations. Electronically signed by Christopher Dickson MD on 10/08/2020 at 1:51:35 PM.    Final    CT ANGIO HEAD CODE STROKE  Result Date: 10/06/2020 CLINICAL DATA:  Seizure.  Abnormal head CT EXAM: CT ANGIOGRAPHY HEAD AND NECK TECHNIQUE: Multidetector CT imaging of the   head and neck was performed using the standard protocol during bolus administration of intravenous contrast. Multiplanar CT image reconstructions and MIPs were obtained to evaluate the vascular anatomy. Carotid stenosis measurements (when applicable) are obtained utilizing NASCET criteria, using the distal internal carotid diameter as the denominator. CONTRAST:  21m OMNIPAQUE IOHEXOL 350 MG/ML SOLN COMPARISON:  MRI and MRA from yesterday. FINDINGS: CTA NECK FINDINGS Aortic arch: 3 vessel branching. Atheromatous plaque. No acute finding or dilatation. Right carotid system: Mixed density plaque at the bifurcation without stenosis or ulceration. Left carotid system: Mixed density plaque mainly at the bifurcation without stenosis or ulceration. Vertebral arteries: Bilateral proximal subclavian atherosclerosis without significant stenosis. The vertebral arteries are smooth and widely patent to the dura. Skeleton: C6-7 ACDF. Other neck: No incidental inflammation or mass seen. Upper chest: No acute finding Review of the MIP images confirms the above findings CTA HEAD FINDINGS Anterior circulation: Atheromatous plaque along the carotid siphons with up to mild stenosis. No branch occlusion,  beading, or aneurysm seen. A left M2 branch stenosis on prior MRA was primarily related to branching and artifact. MCA atheromatous changes are mild. Posterior circulation: Mild left vertebral artery dominance. The vertebral and basilar arteries are smooth and widely patent. Mild to moderate atheromatous irregularity of bilateral proximal posterior cerebral arteries. High-grade narrowing is seen at the upper branch of the right PCA. Negative for aneurysm. Venous sinuses: Diffusely patent Anatomic variants: None significant Review of the MIP images confirms the above findings IMPRESSION: 1. No emergent vascular finding.  No evident embolic source. 2. Cervical and intracranial atherosclerosis. Intracranial narrowings were overestimated on preceding MRA, but there is a high-grade right PCA branch stenosis. 3. No flow reducing stenosis or ulceration in the neck. Electronically Signed   By: JMonte FantasiaM.D.   On: 10/06/2020 06:08   CT ANGIO NECK CODE STROKE  Result Date: 10/06/2020 CLINICAL DATA:  Seizure.  Abnormal head CT EXAM: CT ANGIOGRAPHY HEAD AND NECK TECHNIQUE: Multidetector CT imaging of the head and neck was performed using the standard protocol during bolus administration of intravenous contrast. Multiplanar CT image reconstructions and MIPs were obtained to evaluate the vascular anatomy. Carotid stenosis measurements (when applicable) are obtained utilizing NASCET criteria, using the distal internal carotid diameter as the denominator. CONTRAST:  761mOMNIPAQUE IOHEXOL 350 MG/ML SOLN COMPARISON:  MRI and MRA from yesterday. FINDINGS: CTA NECK FINDINGS Aortic arch: 3 vessel branching. Atheromatous plaque. No acute finding or dilatation. Right carotid system: Mixed density plaque at the bifurcation without stenosis or ulceration. Left carotid system: Mixed density plaque mainly at the bifurcation without stenosis or ulceration. Vertebral arteries: Bilateral proximal subclavian atherosclerosis without  significant stenosis. The vertebral arteries are smooth and widely patent to the dura. Skeleton: C6-7 ACDF. Other neck: No incidental inflammation or mass seen. Upper chest: No acute finding Review of the MIP images confirms the above findings CTA HEAD FINDINGS Anterior circulation: Atheromatous plaque along the carotid siphons with up to mild stenosis. No branch occlusion, beading, or aneurysm seen. A left M2 branch stenosis on prior MRA was primarily related to branching and artifact. MCA atheromatous changes are mild. Posterior circulation: Mild left vertebral artery dominance. The vertebral and basilar arteries are smooth and widely patent. Mild to moderate atheromatous irregularity of bilateral proximal posterior cerebral arteries. High-grade narrowing is seen at the upper branch of the right PCA. Negative for aneurysm. Venous sinuses: Diffusely patent Anatomic variants: None significant Review of the MIP images confirms the above findings IMPRESSION: 1. No emergent vascular  finding.  No evident embolic source. 2. Cervical and intracranial atherosclerosis. Intracranial narrowings were overestimated on preceding MRA, but there is a high-grade right PCA branch stenosis. 3. No flow reducing stenosis or ulceration in the neck. Electronically Signed   By: Monte Fantasia M.D.   On: 10/06/2020 06:08    12-lead ECG NSR at 82 bpm (personally reviewed) No prior EKG's available   Telemetry NSR 60-90s (personally reviewed)  Assessment and Plan:  1. Cryptogenic stroke The patient presents with cryptogenic stroke.  The patient does have a TEE planned for this AM.  I spoke at length with the patient about monitoring for afib with an implantable loop recorder.  Risks, benefits, and alteratives to implantable loop recorder were discussed with the patient today.   At this time, the patient is very clear in their decision to proceed with implantable loop recorder.   2. HTN Per neuro s/p stroke  3. Seizures - EEG  10/28 generalized intermittent slowing, generalized background attenuation - LT EEG 10/06/2020 2055 to 10/30/221 0900:  mild diffuse encephalopathy, nonspecific etiology but likely related to sedation. No seizures or definite epileptiform discharges were seen throughout the record - Neuro following on Keppra  4. Tendency to over-medicate There is some question re: her home meds. Neuro following recommends psych referral and discontinuation of medications with potential for abuse. She denies taking more of her meds than ordered currently.    Wound care was reviewed with the patient (keep incision clean and dry for 3 days).  Wound check scheduled and entered in AVS. Please call with questions.   Shirley Friar, PA-C 10/12/2020 9:55 AM  I have seen and examined this patient with Oda Kilts.  Agree with above, note added to reflect my findings.  On exam, RRR, no murmurs.  Patient presented to the hospital with cryptogenic stroke. To date, no cause has been found. TEE planned for today. If unrevealing, Stellah Donovan plan for LINQ monitor to look for atrial fibrillation. Risks and benefits discussed. Risks include but not limited to bleeding and infection. The patient understands the risks and has agreed to the procedure.  Rashanda Magloire M. Arsalan Brisbin MD 10/12/2020 10:58 AM

## 2020-10-13 ENCOUNTER — Encounter (HOSPITAL_COMMUNITY): Payer: Self-pay | Admitting: Cardiology

## 2020-10-13 LAB — CULTURE, BLOOD (ROUTINE X 2)
Culture: NO GROWTH
Culture: NO GROWTH
Special Requests: ADEQUATE
Special Requests: ADEQUATE

## 2020-10-13 LAB — ANTINUCLEAR ANTIBODIES, IFA: ANA Ab, IFA: NEGATIVE

## 2020-10-13 LAB — ANCA TITERS
Atypical P-ANCA titer: <1:20 {titer}
C-ANCA: <1:20 {titer}
P-ANCA: <1:20 {titer}

## 2020-10-14 ENCOUNTER — Encounter (HOSPITAL_COMMUNITY): Payer: Self-pay | Admitting: Cardiology

## 2020-10-14 LAB — MPO/PR-3 (ANCA) ANTIBODIES
ANCA Proteinase 3: <3.5 U/mL (ref 0.0–3.5)
Myeloperoxidase Abs: <9 U/mL (ref 0.0–9.0)

## 2020-10-17 LAB — CRYOGLOBULIN

## 2020-10-27 ENCOUNTER — Telehealth: Payer: 59

## 2020-10-27 ENCOUNTER — Other Ambulatory Visit: Payer: Self-pay

## 2020-10-27 ENCOUNTER — Telehealth: Payer: Self-pay

## 2020-10-27 NOTE — Telephone Encounter (Signed)
Attempted to contact patient for virtual visit. Unsuccessful.  LMOVM.

## 2020-10-27 NOTE — Telephone Encounter (Signed)
2nd attempt to contact patient. No answer, LMOVM. 

## 2020-11-10 ENCOUNTER — Ambulatory Visit (INDEPENDENT_AMBULATORY_CARE_PROVIDER_SITE_OTHER): Payer: 59 | Admitting: Neurology

## 2020-11-10 ENCOUNTER — Other Ambulatory Visit: Payer: Self-pay

## 2020-11-10 ENCOUNTER — Encounter: Payer: Self-pay | Admitting: Neurology

## 2020-11-10 VITALS — BP 111/71 | HR 87 | Ht 61.0 in | Wt 105.0 lb

## 2020-11-10 DIAGNOSIS — I6340 Cerebral infarction due to embolism of unspecified cerebral artery: Secondary | ICD-10-CM | POA: Diagnosis not present

## 2020-11-10 DIAGNOSIS — G40909 Epilepsy, unspecified, not intractable, without status epilepticus: Secondary | ICD-10-CM | POA: Diagnosis not present

## 2020-11-10 DIAGNOSIS — M21331 Wrist drop, right wrist: Secondary | ICD-10-CM | POA: Diagnosis not present

## 2020-11-10 NOTE — Progress Notes (Signed)
Follow-up Visit   Date: 11/10/20   Judith Sullivan MRN: [id number] DOB: [birth year]   Interim History: Judith Sullivan is a 59 y.o. left-handed Caucasian female with tobacco use, peripheral arterial disease, COPD, chronic pain, diabetes, hypertension, and history of cervical fusion  returning to the clinic with new complaints of multifocal stroke, seizures, and right wrist drop.  The patient was accompanied to the clinic by husband who also provides collateral information.    STROKE/SEIZURE She was hospitalzed on 10/26 - 11/3 with confusion, vomiting, and respiratory insufficiency.  Hospital course notable for new onset seizure, multifocal strokes (R posterior MCA, punctate bilateral MCA/PCA, R MCA/PCA, right MCA/ACA due to unknown cause.  UDS, surface echo and TEE was normal.  IS carotids with < 50% on the R ICA, left ICA difficult to assess. Loop recorder implanted during her stay. She was started on ASA + plavix 75mg  x 3 months. Keppra 500mg  BID started for seizures.  Since discharge, no new neurological symptoms or interval seizures. She is compliant with her medications.   RIGHT WRIST DROP She woke up on a Sunday morning on October 10th with wrist drop on the right hand.  Symptoms have been constant since then.  She has some numbness over the dorsum of the hand.  No weakness on the left hand.  She uses a wrist splint, but does not feel that it helps.  There has been no improvement since onset.    She has lost 15lb since her hospital admission   She has been fully vaccinated. She had not had COVID infection. She does not consume alcohol.   Medications:  Current Outpatient Medications on File Prior to Visit  Medication Sig Dispense Refill  . amitriptyline (ELAVIL) 25 MG tablet Take 25 mg by mouth at bedtime.    Wednesday amoxicillin-clavulanate (AUGMENTIN) 500-125 MG tablet Take 1 tablet by mouth 2 (two) times daily.    October 12 aspirin 325 MG tablet Take 1 tablet (325 mg total) by mouth daily.  Aspirin 325 mg daily and Plavix 75 mg daily for 3 months.  After that aspirin alone. 90 tablet 0  . atorvastatin (LIPITOR) 40 MG tablet Take 1 tablet (40 mg total) by mouth daily. 90 tablet 0  . cholecalciferol (VITAMIN D) 25 MCG tablet Take 1 tablet (1,000 Units total) by mouth daily. 90 tablet 0  . clopidogrel (PLAVIX) 75 MG tablet Take 1 tablet (75 mg total) by mouth daily. Aspirin 325 mg daily and Plavix 75 mg daily for 3 months.  After that aspirin alone. 90 tablet 0  . gabapentin (NEURONTIN) 800 MG tablet Take 800 mg by mouth 3 (three) times daily.    Marland Kitchen levETIRAcetam (KEPPRA) 500 MG tablet Take 1 tablet (500 mg total) by mouth 2 (two) times daily. 180 tablet 0  . lisinopril (ZESTRIL) 20 MG tablet     . metFORMIN (GLUCOPHAGE) 500 MG tablet Take by mouth 2 (two) times daily with a meal. Take 2 po qam and 1 po qhs.    . metoprolol tartrate (LOPRESSOR) 25 MG tablet Take 1 tablet (25 mg total) by mouth 2 (two) times daily. 180 tablet 0  . oxyCODONE (ROXICODONE) 15 MG immediate release tablet Take 15 mg by mouth 4 (four) times daily as needed.    . sitaGLIPtin (JANUVIA) 100 MG tablet Take 100 mg by mouth daily.    Marland Kitchen tiZANidine (ZANAFLEX) 4 MG tablet Take 4 mg by mouth every 8 (eight) hours as needed.    . valACYclovir (  VALTREX) 1000 MG tablet Take 1,000 mg by mouth daily.    . vitamin B-12 (CYANOCOBALAMIN) 100 MCG tablet Take 1 tablet (100 mcg total) by mouth daily. 90 tablet 0   No current facility-administered medications on file prior to visit.    Allergies: No Known Allergies  Vital Signs:  BP 111/71   Pulse 87   Ht 5\' 1"  (1.549 m)   Wt 105 lb (47.6 kg)   SpO2 98%   BMI 19.84 kg/m    General Medical Exam:   General:  Well appearing, comfortable  Eyes/ENT: see cranial nerve examination.   Neck:  No carotid bruits. Respiratory:  Clear to auscultation, good air entry bilaterally.   Cardiac:  Regular rate and rhythm, no murmur.   Ext:  No edema, very thin R wrist  drop  Neurological Exam: MENTAL STATUS including orientation to time, place, person, recent and remote memory, attention span and concentration, language, and fund of knowledge is normal.  Speech is not dysarthric.  CRANIAL NERVES:  No visual field defects.  Pupils equal round and reactive to light.  Normal conjugate, extra-ocular eye movements in all directions of gaze.  No ptosis.  Face is symmetric. Palate elevates symmetrically.  Tongue is midline.  MOTOR:  R wrist hand, claw hand appearance. No atrophy, fasciculations or abnormal movements.  No pronator drift.   Upper Extremity:  Right  Left  Deltoid  5/5   5/5   Biceps  5/5   5/5   Triceps  5/5   5/5   Infraspinatus 5/5  5/5  Medial pectoralis 5/5  5/5  Wrist extensors  0/5   5/5   Wrist flexors  4/5   5/5   Finger extensors  0/5   5/5   Finger flexors  4/5   5/5   Dorsal interossei  4/5   5/5   Abductor pollicis  5/5   5/5   Tone (Ashworth scale)  0  0   Lower Extremity:  Right  Left  Hip flexors  5/5   5/5   Hip extensors  5/5   5/5   Adductor 5/5  5/5  Abductor 5/5  5/5  Knee flexors  5/5   5/5   Knee extensors  5/5   5/5   Dorsiflexors  5/5   5/5   Plantarflexors  5/5   5/5   Toe extensors  5/5   5/5   Toe flexors  5/5   5/5   Tone (Ashworth scale)  0  0   MSRs:  Reflexes are 2+/4 throughout, except 1/4 at the ankles  SENSORY:  Vibration, pin prick and temperature reduced over the feet, and temperature loss over the dorsum of the right hand  COORDINATION/GAIT:  Normal finger-to- nose-finger.  Intact rapid alternating movements bilaterally.  Gait narrow based and stable.   Data: MRI/A head 10/05/2020: Brain MRI: 1. 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cerebral convexities. 2. Remote lacunar infarcts in the left corona radiata and bilateral thalamus. 3. Motion degraded.  Intracranial MRA: Multifocal medium-sized vessel narrowings, presumably atheromatous. Most notable is a severe  proximal left M2 branch stenosis.  10/07/2020 Carotids 10/05/2020: Bilateral carotid atherosclerosis. No hemodynamically significant right ICA stenosis. Degree of narrowing less than 50% by ultrasound criteria.  Normal antegrade right vertebral artery.  Unable to perform the left carotid and vertebral artery portions of the exam.  IMPRESSION/PLAN: 1. Right wrist drop - ?radial neuropathy vs C8 radiculopathy - severe  - Request MRI cervical  spine from [facility]  - NCS/EMG of the RUE   - Referral to [employer] for R cockup wrist splint  2. Multifocal stroke of unknown etiology, no residual deficits.  No large vessel occlusion, septic emboli, or thrombus.  Loop recorder has been implanted  - ASA 325mg  + plavix 75mg  x 3 months, then aspirin alone  - Continue atorvastatin 40mg   - Counseled on tobacco cessation  - Diabetes and BP management as per PCP with goal HbA1c < 7.0, BP < 140/90  3. 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  No interval seizures  - Continue Keppra 500mg  BID  -  driving laws were discussed with the patient, and she knows to stop driving after an episode of loss of consciousness, until 6 months event-free.  4. Diabetic neuropathy  - Recommend adjusting gabapentin 800mg  twice daily  Further recommendations pending results.  Total time spent reviewing records, interview, history/exam, documentation, counselingand coordination of care on day of encounter:  45 min     Thank you for allowing me to participate in patient's care.  If I can answer any additional questions, I would be pleased to do so.    Sincerely,    Hisayo Delossantos K. , DO

## 2020-11-10 NOTE — Patient Instructions (Signed)
Request MRI cervical spine from [facility] Nerve testing of the right hand Referral to [facility] for Right cock-up wrist splint Continue Keppra 500mg  twice daily Continue aspirin and plavix  No driving until seizure-free for 6 months Please try to stop smoking   ELECTROMYOGRAM AND NERVE CONDUCTION STUDIES (EMG/NCS) INSTRUCTIONS  How to Prepare The neurologist conducting the EMG will need to know if you have certain medical conditions. Tell the neurologist and other EMG lab personnel if you: . Have a pacemaker or any other electrical medical device . Take blood-thinning medications . Have hemophilia, a blood-clotting disorder that causes prolonged bleeding Bathing Take a shower or bath shortly before your exam in order to remove oils from your skin. Don't apply lotions or creams before the exam.  What to Expect You'll likely be asked to change into a hospital gown for the procedure and lie down on an examination table. The following explanations can help you understand what will happen during the exam.  . Electrodes. The neurologist or a technician places surface electrodes at various locations on your skin depending on where you're experiencing symptoms. Or the neurologist may insert needle electrodes at different sites depending on your symptoms.  . Sensations. The electrodes will at times transmit a tiny electrical current that you may feel as a twinge or spasm. The needle electrode may cause discomfort or pain that usually ends shortly after the needle is removed. If you are concerned about discomfort or pain, you may want to talk to the neurologist about taking a short break during the exam.  . Instructions. During the needle EMG, the neurologist will assess whether there is any spontaneous electrical activity when the muscle is at rest - activity that isn't present in healthy muscle tissue - and the degree of activity when you slightly contract the muscle.  He or she will give you  instructions on resting and contracting a muscle at appropriate times. Depending on what muscles and nerves the neurologist is examining, he or she may ask you to change positions during the exam.  After your EMG You may experience some temporary, minor bruising where the needle electrode was inserted into your muscle. This bruising should fade within several days. If it persists, contact your primary care doctor.

## 2020-11-15 ENCOUNTER — Ambulatory Visit (INDEPENDENT_AMBULATORY_CARE_PROVIDER_SITE_OTHER): Payer: 59

## 2020-11-15 DIAGNOSIS — I6340 Cerebral infarction due to embolism of unspecified cerebral artery: Secondary | ICD-10-CM

## 2020-11-15 LAB — CUP PACEART REMOTE DEVICE CHECK
Date Time Interrogation Session: 20211207042153
Implantable Pulse Generator Implant Date: 20211103

## 2020-11-21 ENCOUNTER — Ambulatory Visit: Payer: 59 | Admitting: Neurology

## 2020-11-23 ENCOUNTER — Other Ambulatory Visit: Payer: Self-pay

## 2020-11-23 ENCOUNTER — Ambulatory Visit (INDEPENDENT_AMBULATORY_CARE_PROVIDER_SITE_OTHER): Payer: 59 | Admitting: Neurology

## 2020-11-23 DIAGNOSIS — I6340 Cerebral infarction due to embolism of unspecified cerebral artery: Secondary | ICD-10-CM

## 2020-11-23 DIAGNOSIS — M21331 Wrist drop, right wrist: Secondary | ICD-10-CM | POA: Diagnosis not present

## 2020-11-23 DIAGNOSIS — M5412 Radiculopathy, cervical region: Secondary | ICD-10-CM

## 2020-11-23 DIAGNOSIS — G40909 Epilepsy, unspecified, not intractable, without status epilepticus: Secondary | ICD-10-CM

## 2020-11-23 NOTE — Procedures (Signed)
[facility]  [address] [place], [address]  [place], [place] [address] Tel: [phone] Fax:  [phone] Test Date:  11/23/2020  Patient: Judith Sullivan DOB: 14-Jan-1961 Physician: Nita Sickle, DO  Sex: Female Height: 5\' 1"  Ref Phys: , DO  ID#: Nita Sickle   Technician:    Patient Complaints: This is a 59 year old female referred for evaluation of right wrist drop.  NCV & EMG Findings: Extensive electrodiagnostic testing of the right upper extremity and additional studies of the left shows:  1. Bilateral median, ulnar, radial, and left medial and lateral antibrachial cutaneous sensory responses are within normal limits. 2. Bilateral median motor responses are within normal limits.  Bilateral ulnar and right radial motor responses showed reduced amplitude (R5.7, L5.5, R1.9 mV). 3. Reduced recruitment is seen in the C7-8 myotomes, with no motor unit recruitment seen in the extensor carpi radialis muscle.  There is no evidence of accompanied active denervation.   Impression: 1. 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 2. There is no definite evidence of a right brachial plexopathy or radial mononeuropathy.   ___________________________ 41, DO    Nerve Conduction Studies Anti Sensory Summary Table   Stim Site NR Peak (ms) Norm Peak (ms) P-T Amp (V) Norm P-T Amp  Right Lat Ante Brach Cutan Anti Sensory (Lat Forearm)  32C  Lat Biceps    2.1 <2.9 9.7   Right Med Ante Brach Cutan Anti Sensory (Med Forearm)  32C  Elbow    2.0  8.0   Left Median Anti Sensory (2nd Digit)  32C  Wrist    3.1 <3.6 45.9 >15  Right Median Anti Sensory (2nd Digit)  32C  Wrist    3.3 <3.6 49.7 >15  Left Radial Anti Sensory (Base 1st Digit)  32C  Wrist    2.2 <2.7 40.9 >14  Right Radial Anti Sensory (Base 1st Digit)  32C  Wrist    2.3 <2.7 37.5 >14  Left Ulnar Anti Sensory (5th Digit)  32C  Wrist    3.1 <3.1 25.2 >10  Right Ulnar Anti  Sensory (5th Digit)  32C  Wrist    2.9 <3.1 27.1 >10   Motor Summary Table   Stim Site NR Onset (ms) Norm Onset (ms) O-P Amp (mV) Norm O-P Amp Site1 Site2 Delta-0 (ms) Dist (cm) Vel (m/s) Norm Vel (m/s)  Left Median Motor (Abd Poll Brev)  32C  Wrist    2.5 <4.0 9.6 >6 Elbow Wrist 4.7 27.0 57 >50  Elbow    7.2  8.7         Right Median Motor (Abd Poll Brev)  32C  Wrist    3.0 <4.0 7.7 >6 Elbow Wrist 4.3 23.0 53 >50  Elbow    7.3  7.4         Right Radial Motor (Ext Ind Prop)  32C  7cm    2.0 <3.1 1.9 >5 ACF 7cm 1.9 10.0 53 >50  ACF    3.9  1.8  B-spiral groove ACF 0.9 5.0 56   B-spiral groove    4.8  1.7  A-spiral groove B-spiral groove 0.4 2.0 50   A-spiral groove    5.2  1.4         Left Ulnar Motor (Abd Dig Minimi)  32C  Wrist    1.9 <3.1 5.5 >7 B Elbow Wrist 3.4 19.0 56 >50  B Elbow    5.3  5.3  A Elbow B Elbow 2.0 10.0 50 >  50  A Elbow    7.3  5.2         Right Ulnar Motor (Abd Dig Minimi)  32C  Wrist    2.5 <3.1 5.7 >7 B Elbow Wrist 3.0 18.0 60 >50  B Elbow    5.5  4.6  A Elbow B Elbow 1.8 10.0 56 >50  A Elbow    7.3  4.3          EMG   Side Muscle Ins Act Fibs Psw Fasc Number Recrt Dur Dur. Amp Amp. Poly Poly. Comment  Right 1stDorInt Nml Nml Nml Nml 1- Rapid Nml Nml Nml Nml Nml Nml N/A  Right Ext Indicis Nml Nml Nml Nml 3- Rapid Nml Nml Nml Nml Nml Nml N/A  Right ABD Dig Min Nml Nml Nml Nml 2- Rapid Nml Nml Nml Nml Nml Nml N/A  Right FlexCarpiUln Nml Nml Nml Nml Nml Nml Nml Nml Nml Nml Nml Nml N/A  Right ExtCarRadLong Nml Nml Nml Nml NE None - - - - - - N/A  Right Ext Digitorum Nml Nml Nml Nml 3- Nml Nml Nml Nml Nml Nml Nml N/A  Right BrachioRad Nml Nml Nml Nml Nml Nml Nml Nml Nml Nml Nml Nml N/A  Right Abd Poll Brev Nml Nml Nml Nml Nml Nml Nml Nml Nml Nml Nml Nml N/A  Right FlexPolLong Nml Nml Nml Nml 1- Rapid Nml Nml Nml Nml Nml Nml N/A  Right Biceps Nml Nml Nml Nml Nml Nml Nml Nml Nml Nml Nml Nml N/A  Right Triceps Nml Nml Nml Nml 1- Rapid Nml Nml Nml Nml Nml Nml  N/A  Right Deltoid Nml Nml Nml Nml Nml Nml Nml Nml Nml Nml Nml Nml N/A  Left 1stDorInt Nml Nml Nml Nml Nml Nml Nml Nml Nml Nml Nml Nml N/A  Left Abd Poll Brev Nml Nml Nml Nml Nml Nml Nml Nml Nml Nml Nml Nml N/A  Left Ext Indicis Nml Nml Nml Nml Nml Nml Nml Nml Nml Nml Nml Nml N/A  Left PronatorTeres Nml Nml Nml Nml Nml Nml Nml Nml Nml Nml Nml Nml N/A  Left Biceps Nml Nml Nml Nml Nml Nml Nml Nml Nml Nml Nml Nml N/A  Left Triceps Nml Nml Nml Nml 1- Rapid Nml Nml Nml Nml Nml Nml N/A  Left Deltoid Nml Nml Nml Nml Nml Nml Nml Nml Nml Nml Nml Nml N/A  Left ABD Dig Min Nml Nml Nml Nml 1- Rapid Nml Nml Nml Nml Nml Nml N/A      Waveforms:

## 2020-11-24 ENCOUNTER — Other Ambulatory Visit: Payer: Self-pay

## 2020-11-24 DIAGNOSIS — M5412 Radiculopathy, cervical region: Secondary | ICD-10-CM

## 2020-11-24 DIAGNOSIS — M21331 Wrist drop, right wrist: Secondary | ICD-10-CM

## 2020-11-25 NOTE — Progress Notes (Signed)
Carelink Summary Report / Loop Recorder 

## 2020-12-08 ENCOUNTER — Ambulatory Visit (HOSPITAL_COMMUNITY)
Admission: RE | Admit: 2020-12-08 | Discharge: 2020-12-08 | Disposition: A | Discharge status: Home / Self Care | Payer: 59 | Source: Ambulatory Visit | Attending: Neurology | Admitting: Neurology

## 2020-12-08 ENCOUNTER — Other Ambulatory Visit: Payer: Self-pay

## 2020-12-08 DIAGNOSIS — M21331 Wrist drop, right wrist: Secondary | ICD-10-CM | POA: Insufficient documentation

## 2020-12-08 DIAGNOSIS — M5412 Radiculopathy, cervical region: Secondary | ICD-10-CM

## 2020-12-19 ENCOUNTER — Ambulatory Visit (INDEPENDENT_AMBULATORY_CARE_PROVIDER_SITE_OTHER): Payer: 59

## 2020-12-19 DIAGNOSIS — I6340 Cerebral infarction due to embolism of unspecified cerebral artery: Secondary | ICD-10-CM

## 2020-12-19 LAB — CUP PACEART REMOTE DEVICE CHECK
Date Time Interrogation Session: 20220109041715
Implantable Pulse Generator Implant Date: 20211103

## 2020-12-20 ENCOUNTER — Telehealth: Payer: Self-pay | Admitting: Neurology

## 2020-12-20 DIAGNOSIS — G610 Guillain-Barre syndrome: Secondary | ICD-10-CM

## 2020-12-20 NOTE — Telephone Encounter (Signed)
MRI results discussed with patient which does not show nerve impingement affecting the C7-8 nerve roots.  Given that her EMG showed changes beyond the radial nerve distribution, the next step is CSF testing to look for polyradiculoneuropathy.   We will order LP under IR. Check CSF cell count and diff, protein, glucose, IgG index, oligoclonal bands.  Judith Pore K. Allena Katz, DO

## 2020-12-20 NOTE — Telephone Encounter (Signed)
Pt would like the results of the MRI that was done on 12-08-20

## 2020-12-21 ENCOUNTER — Encounter: Payer: 59 | Admitting: Neurology

## 2020-12-21 NOTE — Telephone Encounter (Signed)
LP ordered added to go to [facility] imaging

## 2020-12-28 ENCOUNTER — Other Ambulatory Visit: Payer: Self-pay

## 2020-12-28 ENCOUNTER — Ambulatory Visit
Admission: RE | Admit: 2020-12-28 | Discharge: 2020-12-28 | Disposition: A | Discharge status: Home / Self Care | Payer: 59 | Source: Ambulatory Visit | Attending: Neurology | Admitting: Neurology

## 2020-12-28 DIAGNOSIS — G610 Guillain-Barre syndrome: Secondary | ICD-10-CM

## 2020-12-28 DIAGNOSIS — G9341 Metabolic encephalopathy: Secondary | ICD-10-CM

## 2020-12-28 NOTE — Progress Notes (Signed)
Blood drawn from pts LAC for LP blood work. 1 successful attempt, pt tolerated well.

## 2020-12-28 NOTE — Discharge Instructions (Signed)

## 2021-01-02 NOTE — Progress Notes (Signed)
Carelink Summary Report / Loop Recorder 

## 2021-01-03 LAB — CNS IGG SYNTHESIS RATE, CSF+BLOOD
Albumin Serum: 4.2 g/dL (ref 3.5–5.2)
Albumin, CSF: 26.9 mg/dL (ref 8.0–42.0)
CNS-IgG Synthesis Rate: -1.7 mg/24 h (ref <=3.3)
IgG (Immunoglobin G), Serum: 459 mg/dL — ABNORMAL LOW (ref 600–1640)
IgG Total CSF: 1.3 mg/dL (ref 0.8–7.7)
IgG-Index: 0.44 (ref <0.66)

## 2021-01-03 LAB — CSF CELL COUNT WITH DIFFERENTIAL
RBC Count, CSF: 0 cells/uL
WBC, CSF: 1 cells/uL (ref 0–5)

## 2021-01-03 LAB — OLIGOCLONAL BANDS, CSF + SERM

## 2021-01-03 LAB — GLUCOSE, CSF: Glucose, CSF: 82 mg/dL — ABNORMAL HIGH (ref 40–80)

## 2021-01-03 LAB — PROTEIN, CSF: Total Protein, CSF: 44 mg/dL (ref 15–45)

## 2021-01-05 ENCOUNTER — Telehealth: Payer: Self-pay | Admitting: Neurology

## 2021-01-05 DIAGNOSIS — M21331 Wrist drop, right wrist: Secondary | ICD-10-CM

## 2021-01-05 NOTE — Telephone Encounter (Signed)
Patient would like a call back about her test results please call

## 2021-01-05 NOTE — Telephone Encounter (Signed)
Her testing to-date for right wrist drop has shown findings suggestive of C7-8 radiculopathy, however MRI cervical spine did not show compressive pathology at this level.  CSF testing did not show signs of inflammatory changes.  Patient is understandably frustrated at the lack of improvement.  I think it is reasonable to seek a second opinion and have repeat EMG, as enough time has passed to see motor unit changes by now.  We will send a referral to [facility] for right wrist drop.

## 2021-01-06 NOTE — Addendum Note (Signed)
Addended by: Karl Luke A on: 01/06/2021 02:53 PM   Modules accepted: Orders

## 2021-01-06 NOTE — Telephone Encounter (Signed)
Referral faxed to [facility] at [phone]

## 2021-01-18 ENCOUNTER — Telehealth: Payer: Self-pay | Admitting: Neurology

## 2021-01-18 NOTE — Telephone Encounter (Signed)
Patient called to check on the status of the referral to [facility].

## 2021-01-19 NOTE — Telephone Encounter (Signed)
Called patient and provided her with [facility] and provided her with their phone number to schedule her appt. Patient verbalized understanding.

## 2021-01-19 NOTE — Telephone Encounter (Signed)
Called and spoke to [place] at [facility] [phone] and was informed that Referral has been received. Molly Maduro advised to have patient call them at [phone] to schedule her appt.

## 2021-01-21 LAB — CUP PACEART REMOTE DEVICE CHECK
Date Time Interrogation Session: 20220211042108
Implantable Pulse Generator Implant Date: 20211103

## 2021-01-23 ENCOUNTER — Ambulatory Visit (INDEPENDENT_AMBULATORY_CARE_PROVIDER_SITE_OTHER): Payer: 59

## 2021-01-23 DIAGNOSIS — I6340 Cerebral infarction due to embolism of unspecified cerebral artery: Secondary | ICD-10-CM

## 2021-01-30 ENCOUNTER — Telehealth: Payer: Self-pay | Admitting: Neurology

## 2021-01-30 NOTE — Telephone Encounter (Signed)
Printed awaiting approval.

## 2021-01-30 NOTE — Telephone Encounter (Signed)
Yes, okay to send reports to PCP. Thanks.

## 2021-01-30 NOTE — Telephone Encounter (Signed)
Patient is requesting her EMG, MRI and spinal tap reports be forwarded to her PCP Dr Sherril Croon

## 2021-01-30 NOTE — Telephone Encounter (Signed)
Faxed information.

## 2021-01-30 NOTE — Progress Notes (Signed)
Carelink Summary Report / Loop Recorder 

## 2021-02-01 ENCOUNTER — Telehealth: Payer: Self-pay | Admitting: Neurology

## 2021-02-01 NOTE — Telephone Encounter (Signed)
There is a telephone note that you had tried to contact them on 2/10.  Can you call and see if they can schedule appointment over the phone?

## 2021-02-01 NOTE — Telephone Encounter (Signed)
Called patient just to let her know I have contacted [facility] on her behalf and am waiting on a call back. Informed her I will contact them tomorrow by noon if I do not hear anything back. Patient expressed thanks and stated she felt better that we were looking out for her.

## 2021-02-01 NOTE — Telephone Encounter (Signed)
Patient called in stating Dr. Allena Katz referred her to [facility] and tried to call them to schedule an appointment. She was told they had not even looked at her referral yet and couldn't schedule her. She was then told they didn't know how long that would take. The patient is wanting Dr. Allena Katz to call that office to stop them from "dragging their feet".

## 2021-02-01 NOTE — Telephone Encounter (Signed)
Called [facility] and spoke to Judith Sullivan who informed me that he doesn't know what the issue is with scheduling and will send a message for them to call me back. Stated that he see's patients referral in their system and not sure what is going on. Waiting for a call back

## 2021-02-03 NOTE — Telephone Encounter (Signed)
Called [facility] at [phone] and was informed that patient is scheduled of 04/11/21 at 10 am with Dr. Sherlean Foot at the [facility]. Patient is aware.

## 2021-02-23 LAB — CUP PACEART REMOTE DEVICE CHECK
Date Time Interrogation Session: 20220316042224
Implantable Pulse Generator Implant Date: 20211103

## 2021-02-27 ENCOUNTER — Ambulatory Visit (INDEPENDENT_AMBULATORY_CARE_PROVIDER_SITE_OTHER): Payer: 59

## 2021-02-27 DIAGNOSIS — I6340 Cerebral infarction due to embolism of unspecified cerebral artery: Secondary | ICD-10-CM | POA: Diagnosis not present

## 2021-03-07 NOTE — Progress Notes (Signed)
Carelink Summary Report / Loop Recorder 

## 2021-03-27 ENCOUNTER — Ambulatory Visit (INDEPENDENT_AMBULATORY_CARE_PROVIDER_SITE_OTHER): Payer: 59

## 2021-03-27 DIAGNOSIS — I6340 Cerebral infarction due to embolism of unspecified cerebral artery: Secondary | ICD-10-CM

## 2021-03-29 LAB — CUP PACEART REMOTE DEVICE CHECK
Date Time Interrogation Session: 20220418041703
Implantable Pulse Generator Implant Date: 20211103

## 2021-04-13 NOTE — Progress Notes (Signed)
Carelink Summary Report / Loop Recorder 

## 2021-04-25 NOTE — Addendum Note (Signed)
Addended by: Geralyn Flash D on: 04/25/2021 03:27 PM   Modules accepted: Level of Service

## 2021-04-26 ENCOUNTER — Emergency Department (HOSPITAL_COMMUNITY): Payer: 59

## 2021-04-26 ENCOUNTER — Encounter (HOSPITAL_COMMUNITY): Payer: Self-pay

## 2021-04-26 ENCOUNTER — Inpatient Hospital Stay (HOSPITAL_COMMUNITY)
Admission: EM | Admit: 2021-04-26 | Discharge: 2021-04-28 | DRG: 682 | Disposition: A | Discharge status: Home Health | Payer: 59 | Attending: Family Medicine | Admitting: Family Medicine

## 2021-04-26 ENCOUNTER — Other Ambulatory Visit: Payer: Self-pay

## 2021-04-26 ENCOUNTER — Inpatient Hospital Stay (HOSPITAL_COMMUNITY): Payer: 59

## 2021-04-26 DIAGNOSIS — G40909 Epilepsy, unspecified, not intractable, without status epilepticus: Secondary | ICD-10-CM | POA: Diagnosis present

## 2021-04-26 DIAGNOSIS — R569 Unspecified convulsions: Secondary | ICD-10-CM

## 2021-04-26 DIAGNOSIS — I69351 Hemiplegia and hemiparesis following cerebral infarction affecting right dominant side: Secondary | ICD-10-CM

## 2021-04-26 DIAGNOSIS — R109 Unspecified abdominal pain: Secondary | ICD-10-CM

## 2021-04-26 DIAGNOSIS — G9341 Metabolic encephalopathy: Secondary | ICD-10-CM | POA: Diagnosis present

## 2021-04-26 DIAGNOSIS — Z7984 Long term (current) use of oral hypoglycemic drugs: Secondary | ICD-10-CM | POA: Diagnosis not present

## 2021-04-26 DIAGNOSIS — J449 Chronic obstructive pulmonary disease, unspecified: Secondary | ICD-10-CM | POA: Diagnosis present

## 2021-04-26 DIAGNOSIS — F17210 Nicotine dependence, cigarettes, uncomplicated: Secondary | ICD-10-CM | POA: Diagnosis present

## 2021-04-26 DIAGNOSIS — Z8249 Family history of ischemic heart disease and other diseases of the circulatory system: Secondary | ICD-10-CM | POA: Diagnosis not present

## 2021-04-26 DIAGNOSIS — I10 Essential (primary) hypertension: Secondary | ICD-10-CM | POA: Diagnosis present

## 2021-04-26 DIAGNOSIS — E871 Hypo-osmolality and hyponatremia: Secondary | ICD-10-CM | POA: Diagnosis present

## 2021-04-26 DIAGNOSIS — E874 Mixed disorder of acid-base balance: Secondary | ICD-10-CM | POA: Diagnosis present

## 2021-04-26 DIAGNOSIS — R471 Dysarthria and anarthria: Secondary | ICD-10-CM | POA: Diagnosis present

## 2021-04-26 DIAGNOSIS — Z981 Arthrodesis status: Secondary | ICD-10-CM

## 2021-04-26 DIAGNOSIS — E878 Other disorders of electrolyte and fluid balance, not elsewhere classified: Secondary | ICD-10-CM | POA: Diagnosis present

## 2021-04-26 DIAGNOSIS — R404 Transient alteration of awareness: Secondary | ICD-10-CM

## 2021-04-26 DIAGNOSIS — Z7982 Long term (current) use of aspirin: Secondary | ICD-10-CM

## 2021-04-26 DIAGNOSIS — N179 Acute kidney failure, unspecified: Secondary | ICD-10-CM | POA: Diagnosis present

## 2021-04-26 DIAGNOSIS — I639 Cerebral infarction, unspecified: Secondary | ICD-10-CM | POA: Diagnosis present

## 2021-04-26 DIAGNOSIS — I959 Hypotension, unspecified: Secondary | ICD-10-CM | POA: Diagnosis present

## 2021-04-26 DIAGNOSIS — J9601 Acute respiratory failure with hypoxia: Secondary | ICD-10-CM

## 2021-04-26 DIAGNOSIS — Z823 Family history of stroke: Secondary | ICD-10-CM

## 2021-04-26 DIAGNOSIS — E876 Hypokalemia: Secondary | ICD-10-CM | POA: Diagnosis not present

## 2021-04-26 DIAGNOSIS — E860 Dehydration: Secondary | ICD-10-CM | POA: Diagnosis present

## 2021-04-26 DIAGNOSIS — Z7902 Long term (current) use of antithrombotics/antiplatelets: Secondary | ICD-10-CM | POA: Diagnosis not present

## 2021-04-26 DIAGNOSIS — Z20822 Contact with and (suspected) exposure to covid-19: Secondary | ICD-10-CM | POA: Diagnosis present

## 2021-04-26 DIAGNOSIS — Z22322 Carrier or suspected carrier of Methicillin resistant Staphylococcus aureus: Secondary | ICD-10-CM

## 2021-04-26 DIAGNOSIS — E875 Hyperkalemia: Secondary | ICD-10-CM | POA: Diagnosis present

## 2021-04-26 DIAGNOSIS — Z9049 Acquired absence of other specified parts of digestive tract: Secondary | ICD-10-CM | POA: Diagnosis not present

## 2021-04-26 DIAGNOSIS — E119 Type 2 diabetes mellitus without complications: Secondary | ICD-10-CM | POA: Diagnosis present

## 2021-04-26 DIAGNOSIS — Z79899 Other long term (current) drug therapy: Secondary | ICD-10-CM

## 2021-04-26 DIAGNOSIS — R4182 Altered mental status, unspecified: Secondary | ICD-10-CM

## 2021-04-26 LAB — URINALYSIS, ROUTINE W REFLEX MICROSCOPIC
Bilirubin Urine: NEGATIVE
Glucose, UA: NEGATIVE mg/dL
Ketones, ur: NEGATIVE mg/dL
Leukocytes,Ua: NEGATIVE
Nitrite: NEGATIVE
Protein, ur: 30 mg/dL — AB
Specific Gravity, Urine: 1.013 (ref 1.005–1.030)
pH: 5 (ref 5.0–8.0)

## 2021-04-26 LAB — COMPREHENSIVE METABOLIC PANEL
ALT: 25 U/L (ref 0–44)
AST: 34 U/L (ref 15–41)
Albumin: 4.4 g/dL (ref 3.5–5.0)
Alkaline Phosphatase: 84 U/L (ref 38–126)
Anion gap: 19 — ABNORMAL HIGH (ref 5–15)
BUN: 97 mg/dL — ABNORMAL HIGH (ref 6–20)
CO2: 18 mmol/L — ABNORMAL LOW (ref 22–32)
Calcium: 8.5 mg/dL — ABNORMAL LOW (ref 8.9–10.3)
Chloride: 90 mmol/L — ABNORMAL LOW (ref 98–111)
Creatinine, Ser: 9.37 mg/dL — ABNORMAL HIGH (ref 0.44–1.00)
GFR, Estimated: 4 mL/min — ABNORMAL LOW (ref >60)
Glucose, Bld: 117 mg/dL — ABNORMAL HIGH (ref 70–99)
Potassium: 5.3 mmol/L — ABNORMAL HIGH (ref 3.5–5.1)
Sodium: 127 mmol/L — ABNORMAL LOW (ref 135–145)
Total Bilirubin: 0.5 mg/dL (ref 0.3–1.2)
Total Protein: 7 g/dL (ref 6.5–8.1)

## 2021-04-26 LAB — CBC WITH DIFFERENTIAL/PLATELET
Abs Immature Granulocytes: 0.07 10*3/uL (ref 0.00–0.07)
Basophils Absolute: 0 10*3/uL (ref 0.0–0.1)
Basophils Relative: 0 %
Eosinophils Absolute: 0 10*3/uL (ref 0.0–0.5)
Eosinophils Relative: 0 %
HCT: 40.4 % (ref 36.0–46.0)
Hemoglobin: 13.8 g/dL (ref 12.0–15.0)
Immature Granulocytes: 1 %
Lymphocytes Relative: 11 %
Lymphs Abs: 1.2 10*3/uL (ref 0.7–4.0)
MCH: 32.9 pg (ref 26.0–34.0)
MCHC: 34.2 g/dL (ref 30.0–36.0)
MCV: 96.2 fL (ref 80.0–100.0)
Monocytes Absolute: 0.6 10*3/uL (ref 0.1–1.0)
Monocytes Relative: 5 %
Neutro Abs: 9.1 10*3/uL — ABNORMAL HIGH (ref 1.7–7.7)
Neutrophils Relative %: 83 %
Platelets: 192 10*3/uL (ref 150–400)
RBC: 4.2 MIL/uL (ref 3.87–5.11)
RDW: 12.4 % (ref 11.5–15.5)
WBC: 11 10*3/uL — ABNORMAL HIGH (ref 4.0–10.5)
nRBC: 0 % (ref 0.0–0.2)

## 2021-04-26 LAB — ETHANOL: Alcohol, Ethyl (B): <10 mg/dL (ref <10)

## 2021-04-26 LAB — RESP PANEL BY RT-PCR (FLU A&B, COVID) ARPGX2
Influenza A by PCR: NEGATIVE
Influenza B by PCR: NEGATIVE
SARS Coronavirus 2 by RT PCR: NEGATIVE

## 2021-04-26 LAB — PROTEIN / CREATININE RATIO, URINE
Creatinine, Urine: 120.1 mg/dL
Protein Creatinine Ratio: 0.51 mg/mg{Cre} — ABNORMAL HIGH (ref 0.00–0.15)
Total Protein, Urine: 61 mg/dL

## 2021-04-26 MED ORDER — SODIUM CHLORIDE 0.9 % IV BOLUS
1000.0000 mL | Freq: Once | INTRAVENOUS | Status: AC
  Administered 2021-04-26: 1000 mL via INTRAVENOUS

## 2021-04-26 MED ORDER — SODIUM CHLORIDE 0.9 % IV SOLN: INTRAVENOUS | Status: DC

## 2021-04-26 MED ORDER — ALBUTEROL SULFATE HFA 108 (90 BASE) MCG/ACT IN AERS: 2.0000 | INHALATION_SPRAY | Freq: Four times a day (QID) | RESPIRATORY_TRACT | Status: DC | PRN

## 2021-04-26 MED ORDER — ONDANSETRON HCL 4 MG PO TABS: 4.0000 mg | ORAL_TABLET | Freq: Four times a day (QID) | ORAL | Status: DC | PRN

## 2021-04-26 MED ORDER — ACETAMINOPHEN 650 MG RE SUPP: 650.0000 mg | Freq: Four times a day (QID) | RECTAL | Status: DC | PRN

## 2021-04-26 MED ORDER — ONDANSETRON HCL 4 MG/2ML IJ SOLN: 4.0000 mg | Freq: Four times a day (QID) | INTRAMUSCULAR | Status: DC | PRN

## 2021-04-26 MED ORDER — ACETAMINOPHEN 325 MG PO TABS
650.0000 mg | ORAL_TABLET | Freq: Four times a day (QID) | ORAL | Status: DC | PRN
  Administered 2021-04-28: 650 mg via ORAL
  Filled 2021-04-26: qty 2

## 2021-04-26 MED ORDER — LEVETIRACETAM IN NACL 500 MG/100ML IV SOLN
500.0000 mg | Freq: Two times a day (BID) | INTRAVENOUS | Status: DC
  Administered 2021-04-27 – 2021-04-28 (×3): 500 mg via INTRAVENOUS
  Filled 2021-04-26 (×3): qty 100

## 2021-04-26 MED ORDER — NALOXONE HCL 0.4 MG/ML IJ SOLN
0.4000 mg | Freq: Once | INTRAMUSCULAR | Status: AC
  Administered 2021-04-26: 0.4 mg via INTRAVENOUS
  Filled 2021-04-26: qty 1

## 2021-04-26 NOTE — ED Notes (Signed)
Pt in CT at this time. nad noted.

## 2021-04-26 NOTE — H&P (Addendum)
History and Physical    Judith Sullivan YTK:[id number] DOB: 09-Aug-1961 DOA: 04/26/2021  PCP: Ignatius Specking, MD   Patient coming from: Home  I have personally briefly reviewed patient's old medical records in [facility]  Chief Complaint: AMS  HPI: Judith Sullivan is a 60 y.o. female with medical history significant for CVA, seizures, COPD, asthma, hypertension. Patient was brought to the ED reports of change in mental status, increasing weakness.  History is obtained from patient's spouse who is at bedside, limited history from patient as at the time of my evaluation patient is lethargic and confused. Patient spouse reports that yesterday he noted patient was confused.  He reports patient told him that she vomited about 3 days ago, patient also had some loose stools about 2 days ago.  Spouse is unsure of severity of the symptoms.  Reports at baseline patient does not eat much, and this is not changed.  Patient takes Advil occasionally, but is not sure of quantity.  He is unaware of any new medications-and will bring her home medications tomorrow.  Reports chronic intermittent dizziness.  Patient has chronic mild right sided weakness, that probably precedes her stroke in 2021, he reports this is unchanged. Patient also reported some right flank pain.  Patient takes oxycodone 4 times daily for chronic neck and back pain, her last dose was just prior to ED visit.  ED Course: Temperature 98.8.  Heart rate 70s to 113.  Torrey rate 18-22.  Blood pressure systolic initially 150s, dropped to the 80s in the ED, improved with 1 L bolus.  O2 sats greater than 90% on room air.  Creatinine markedly elevated at 9.37, sodium 127.  Potassium 5.3.  Anion gap of 19. 1 L bolus given hospitalist to admit.  Review of Systems: Unable to assess due to patient's altered mental status  Past Medical History:  Diagnosis Date  . Asthma with bronchitis   . Backache   . COPD (chronic obstructive pulmonary disease)  (HCC)   . CTS (carpal tunnel syndrome)   . Hypertension   . Kidney stones   . Stroke [facility])     Past Surgical History:  Procedure Laterality Date  . BUBBLE STUDY  10/12/2020   Procedure: BUBBLE STUDY;  Surgeon: Jake Bathe, MD;  Location: [facility] ENDOSCOPY;  Service: Cardiovascular;;  . CESAREAN SECTION    . CHOLECYSTECTOMY    . kidney stone removal    . LOOP RECORDER INSERTION N/A 10/12/2020   Procedure: LOOP RECORDER INSERTION;  Surgeon: Regan Lemming, MD;  Location: [facility] INVASIVE CV LAB;  Service: Cardiovascular;  Laterality: N/A;  . neck fusion    . SHOULDER SURGERY    . TEE WITHOUT CARDIOVERSION N/A 10/12/2020   Procedure: TRANSESOPHAGEAL ECHOCARDIOGRAM (TEE);  Surgeon: Jake Bathe, MD;  Location: [facility] ENDOSCOPY;  Service: Cardiovascular;  Laterality: N/A;     reports that she has been smoking. She has a 35.00 pack-year smoking history. She has never used smokeless tobacco. She reports previous alcohol use. She reports that she does not use drugs.  No Known Allergies  Family History  Problem Relation Age of Onset  . Stroke Mother   . Hypertension Mother   . Heart attack Father     Prior to Admission medications   Medication Sig Start Date End Date Taking? Authorizing Provider  amitriptyline (ELAVIL) 25 MG tablet Take 25 mg by mouth at bedtime. 01/29/19   [provider]  amoxicillin-clavulanate (AUGMENTIN) 500-125 MG tablet Take 1  tablet by mouth 2 (two) times daily. 06/15/20   [provider]  atorvastatin (LIPITOR) 40 MG tablet Take 1 tablet (40 mg total) by mouth daily. 10/13/20 01/11/21  Lorin Glassahal, Binaya, MD  gabapentin (NEURONTIN) 800 MG tablet Take 800 mg by mouth 3 (three) times daily. 01/09/19   [provider]  levETIRAcetam (KEPPRA) 500 MG tablet Take 1 tablet (500 mg total) by mouth 2 (two) times daily. 10/12/20 01/10/21  Lorin Glassahal, Binaya, MD  lisinopril (ZESTRIL) 20 MG tablet  11/24/18   [provider]  metFORMIN (GLUCOPHAGE) 500 MG  tablet Take by mouth 2 (two) times daily with a meal. Take 2 po qam and 1 po qhs.    [provider]  metoprolol tartrate (LOPRESSOR) 25 MG tablet Take 1 tablet (25 mg total) by mouth 2 (two) times daily. 10/12/20 01/10/21  Lorin Glassahal, Binaya, MD  oxyCODONE (ROXICODONE) 15 MG immediate release tablet Take 15 mg by mouth 4 (four) times daily as needed. 01/09/19   [provider]  sitaGLIPtin (JANUVIA) 100 MG tablet Take 100 mg by mouth daily.    [provider]  tiZANidine (ZANAFLEX) 4 MG tablet Take 4 mg by mouth every 8 (eight) hours as needed. 01/09/19   [provider]  valACYclovir (VALTREX) 1000 MG tablet Take 1,000 mg by mouth daily. 09/05/20   [provider]    Physical Exam: Exam limited by patient's mental status Vitals:   04/26/21 1240 04/26/21 1244 04/26/21 1257 04/26/21 1420  BP: (!) 151/104  (!) 82/69 (!) 112/97  Pulse: (!) 103  75 96  Resp: 18  (!) 22 20  Temp: 98.8 F (37.1 C)     TempSrc: Oral     SpO2: 90%   96%  Weight:  54.4 kg    Height:  5\' 1"  (1.549 m)      Constitutional: Patient is lethargic, follows some directions and drifts back to sleep, awakens suddenly to touch. Vitals:   04/26/21 1240 04/26/21 1244 04/26/21 1257 04/26/21 1420  BP: (!) 151/104  (!) 82/69 (!) 112/97  Pulse: (!) 103  75 96  Resp: 18  (!) 22 20  Temp: 98.8 F (37.1 C)     TempSrc: Oral     SpO2: 90%   96%  Weight:  54.4 kg    Height:  5\' 1"  (1.549 m)     Eyes: PERRL, lids and conjunctivae normal ENMT: Mucous membranes are very dry.  Neck: normal, supple, no masses, no thyromegaly Respiratory: clear to auscultation bilaterally, no wheezing, no crackles. Normal respiratory effort. No accessory muscle use.  Cardiovascular: Regular rate and rhythm, no murmurs / rubs / gallops. No extremity edema. 2+ pedal pulses.  Abdomen: no tenderness, no masses palpated. No hepatosplenomegaly. Bowel sounds positive.  Musculoskeletal: no clubbing / cyanosis. No joint  deformity upper and lower extremities. Good ROM, no contractures. Normal muscle tone.  Asterixis present Skin: no rashes, lesions, ulcers. No induration Neurologic: Intermittent jecks involving her bilateral upper and lower extremities which are mild, no apparent cranial nerve abnormality, she is confused, but speech appears intact without evidence of aphasia, Sensation intact, strength 4/5 in all extremities. Psychiatric: Lethargic, confused, oriented to person and place.  Mental status improved with 0.4 mg of Narcan  Labs on Admission: I have personally reviewed following labs and imaging studies  CBC: Recent Labs  Lab 04/26/21 1304  WBC 11.0*  NEUTROABS 9.1*  HGB 13.8  HCT 40.4  MCV 96.2  PLT 192   Basic Metabolic Panel:  Recent Labs  Lab 04/26/21 1304  NA 127*  K 5.3*  CL 90*  CO2 18*  GLUCOSE 117*  BUN 97*  CREATININE 9.37*  CALCIUM 8.5*   Liver Function Tests: Recent Labs  Lab 04/26/21 1304  AST 34  ALT 25  ALKPHOS 84  BILITOT 0.5  PROT 7.0  ALBUMIN 4.4   Radiological Exams on Admission: CT Head Wo Contrast  Result Date: 04/26/2021 CLINICAL DATA:  Stroke, follow-up; dizziness, nonspecific. Additional provided: Right-sided weakness for 1 day, history of stroke and hypertension. EXAM: CT HEAD WITHOUT CONTRAST TECHNIQUE: Contiguous axial images were obtained from the base of the skull through the vertex without intravenous contrast. COMPARISON:  Prior head CT examinations 10/06/2020 and earlier. Brain MRI 10/05/2020. FINDINGS: Brain: Cerebral volume is normal. Redemonstrated chronic lacunar infarcts within the left corona radiata/basal ganglia. Known chronic lacunar infarcts within the bilateral thalami were better appreciated on the brain MRI of 10/05/2020. There is no acute intracranial hemorrhage. No acute demarcated cortical infarct. No extra-axial fluid collection. No evidence of intracranial mass. No midline shift. Vascular: No hyperdense vessel.  Atherosclerotic  calcifications. Skull: Normal. Negative for fracture or focal lesion. Sinuses/Orbits: Visualized orbits show no acute finding. Tiny right frontoethmoidal sinus osteoma. Otherwise, no significant paranasal sinus disease at the imaged levels. IMPRESSION: No evidence of acute intracranial abnormality. Known chronic lacunar infarcts within the left corona radiata/basal ganglia and bilateral thalami. Electronically Signed   By: Jackey Loge DO   On: 04/26/2021 14:10    EKG: Independently reviewed.  Sinus rhythm rate 96, QTc 450.  PR interval 199.  Assessment/Plan Principal Problem:   AKI (acute kidney injury) (HCC) Active Problems:   Acute metabolic encephalopathy   Cerebrovascular accident (CVA) (HCC)   Seizures (HCC)   Acute kidney injury-markedly elevated at 9.37, baseline ~ 0.6-0.9.  With elevated BUN of 97.  Hypotension systolic 82/69 and markedly dry mucous membranes suggest prerenal etiology from dehydration, in the setting of lisinopril 20 mg use.  But creatinine of 9 seems quite excessive to be attributed to just dehydration, ? ATN from hypotension.  No urine output since admission.  Renal CT stone study without acute abnormality kidneys or urinary tract unremarkable -2 L bolus given, continue N/s 100cc/hr  -Insert Foley catheter -BMP in a.m - UA pending -I talked to the nephrologist on-call Dr. Malen Gauze, nephrology team will see patient in a.m., recommended getting protein creatinine ratio, hydrate with normal saline, n.p.o. midnight in case dialysis needed tomorrow. -Remain n.p.o. -Hold home lisinopril - Addendum-patient has made at least of urine, patient became agitated causing erroneous BP readings with diastolic in the 200s.  Initial reported hypoxia which was not confirmed, and chest x-ray without acute abnormality.  Fluid rate has been reduced to 75 cc/h for now.  Acute metabolic encephalopathy-with confusion, likely secondary to azotemia, BUN of 97, also with asterixis and  intermittent mild jerks of bilateral upper and lower extremities.  Initially lethargic, but after 0.4 mg of Narcan this improved.  Patient is on oxycodone 4 times daily.  Head CT without acute abnormality, shows chronic lacunar infarcts. -Hydrate -Hold all psychoactive medications including pain medications, gabapentin, amitriptyline  Anion gap metabolic acidosis-anion gap of 19, serum bicarb of 18.  Likely secondary to azotemia. -Hydrate, consider bicarb if no improvement  Hyponatremia-sodium 127, baseline mid 130s.  Likely hypovolemic etiology -Hydrate  CVA-reported residual right-sided weakness not appreciable on exam.  Head CT with chronic infarcts -N.p.o. for now, hold aspirin, statins  Seizures-  -Resume Keppra  as IV 500 mg twice daily  DVT prophylaxis: SCDs for now pending renal function in the morning if HD access is needed Code Status: Full code Family Communication: Spouse at bedside Disposition Plan:  > 2 days Consults called: Nephrology Admission status: Inpatient, telemetry I certify that at the point of admission it is my clinical judgment that the patient will require inpatient hospital care spanning beyond 2 midnights from the point of admission due to high intensity of service, high risk for further deterioration and high frequency of surveillance required.    Onnie Boer MD [facility]  04/26/2021, 11:36 PM

## 2021-04-26 NOTE — ED Notes (Signed)
Dr Sheldon at bedside  

## 2021-04-26 NOTE — ED Notes (Signed)
Pt stated she used too much [place] today for her dentures, pt had excessive amount stuck in mouth and on lips. RN performed mouth care.

## 2021-04-26 NOTE — ED Provider Notes (Signed)
[facility]Y [facility]PART[facility] Provider Note  CSN: [id number] Arrival date & time: 04/26/21 1218    History Chief Complaint  Patient presents with  . Altered Mental Status    HPI  Judith Sullivan is a 60 y.o. 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 He reports she has complained of dizziness since her strokes so he isn't clear when that symptom worsened but she was confused the last days or so, sleeping more than usual and stumbling at home. She reports R flank pain onset yesterday she thought was a kidney stone. She has not had any new focal weakness that he has noticed. She denies any EtOH or illicit drug use. Denies taking extra of her chronic pain medications. She reports some difficulty with speech because she put too much Fixadent denture glue in this morning.    Past Medical History:  Diagnosis Date  . Asthma with bronchitis   . Backache   . COPD (chronic obstructive pulmonary disease) (HCC)   . CTS (carpal tunnel syndrome)   . Hypertension   . Kidney stones   . Stroke [facility](HCC)     Past Surgical History:  Procedure Laterality Date  . BUBBLE STUDY  10/12/2020   Procedure: BUBBLE STUDY;  Surgeon: Jake BatheSkains, Mark C, MD;  [facility];  Service: Cardiovascular;;  . CESAREAN SECTION    . CHOLECYSTECTOMY    . kidney stone removal    . LOOP RECORDER INSERTION N/A 10/12/2020   Procedure: LOOP RECORDER INSERTION;  Surgeon: Regan Lemmingamnitz, Will Martin, MD;  Location: [facility] INVASIVE CV LAB;  Service: Cardiovascular;  Laterality: N/A;  . neck fusion    . SHOULDER SURGERY    . TEE WITHOUT CARDIOVERSION N/A 10/12/2020   Procedure: TRANSESOPHAGEAL ECHOCARDIOGRAM (TEE);  Surgeon: Jake BatheSkains, Mark C, MD;  [facility];  Service: Cardiovascular;  Laterality: N/A;    Family History  Problem Relation Age of Onset  . Stroke Mother   . Hypertension Mother   .  Heart attack Father     Social History   Tobacco Use  . Smoking status: Current Every Day Smoker    Packs/day: 1.00    Years: 35.00    Pack years: 35.00  . Smokeless tobacco: Never Used  Vaping Use  . Vaping Use: Never used  Substance Use Topics  . Alcohol use: Not Currently    Comment: very rarely  . Drug use: Never     Home Medications Prior to Admission medications   Medication Sig Start Date End Date Taking? Authorizing Provider  albuterol (VENTOLIN HFA) 108 (90 Base) MCG/ACT inhaler Inhale 2 puffs into the lungs every 6 (six) hours as needed for wheezing or shortness of breath.   Yes [provider]  amitriptyline (ELAVIL) 25 MG tablet Take 25 mg by mouth at bedtime. 01/29/19  Yes [provider]  aspirin 325 MG EC tablet Take 325 mg by mouth daily.   Yes [provider]  atorvastatin (LIPITOR) 40 MG tablet Take 1 tablet (40 mg total) by mouth daily. 10/13/20 01/11/21 Yes Dahal, Melina SchoolsBinaya, MD  Cholecalciferol 25 MCG (1000 UT) tablet Take 1,000 Units by mouth daily.   Yes [provider]  clopidogrel (PLAVIX) 75 MG tablet Take 75 mg by mouth daily.   Yes [provider]  cyanocobalamin 100 MCG tablet Take 100 mcg by mouth daily.   Yes [provider]  gabapentin (NEURONTIN) 800 MG tablet Take 800 mg by mouth 3 (three) times daily. 01/09/19  Yes [provider]  levETIRAcetam (KEPPRA) 500 MG tablet Take 1 tablet (500 mg total) by mouth 2 (two) times daily. 10/12/20 01/10/21 Yes Dahal, Melina Schools, MD  lisinopril (ZESTRIL) 20 MG tablet Take 20 mg by mouth daily. 11/24/18  Yes [provider]  metFORMIN (GLUCOPHAGE) 500 MG tablet Take by mouth 2 (two) times daily with a meal. Take 2 po qam and 1 po qhs.   Yes [provider]  metoprolol tartrate (LOPRESSOR) 50 MG tablet Take 50 mg by mouth 2 (two) times daily. 03/13/21  Yes [provider]  oxyCODONE (ROXICODONE) 15 MG immediate release tablet Take 15 mg by  mouth 4 (four) times daily as needed. 01/09/19  Yes [provider]  sitaGLIPtin (JANUVIA) 100 MG tablet Take 100 mg by mouth daily.   Yes [provider]  tiZANidine (ZANAFLEX) 4 MG tablet Take 4 mg by mouth every 8 (eight) hours as needed. 01/09/19  Yes [provider]  valACYclovir (VALTREX) 1000 MG tablet Take 1,000 mg by mouth daily. 09/05/20  Yes [provider]  amoxicillin-clavulanate (AUGMENTIN) 500-125 MG tablet Take 1 tablet by mouth 2 (two) times daily. Patient not taking: No sig reported 06/15/20   [provider]  metoprolol tartrate (LOPRESSOR) 25 MG tablet Take 1 tablet (25 mg total) by mouth 2 (two) times daily. Patient not taking: Reported on 04/26/2021 10/12/20 01/10/21  Lorin Glass, MD  mirtazapine (REMERON) 15 MG tablet Take 15 mg by mouth at bedtime. Patient not taking: Reported on 04/26/2021 03/06/21   [provider]     Allergies    Patient has no known allergies.   Review of Systems   Review of Systems A comprehensive review of systems was completed and negative except as noted in HPI.    Physical Exam BP 104/69   Pulse 94   Temp 98.8 F (37.1 C) (Oral)   Resp 20   Ht 5\' 1"  (1.549 m)   Wt 54.4 kg   SpO2 95%   BMI 22.67 kg/m   Physical Exam Vitals and nursing note reviewed.  Constitutional:      Appearance: Normal appearance.  HENT:     Head: Normocephalic and atraumatic.     Nose: Nose normal.     Mouth/Throat:     Mouth: Mucous membranes are dry.     Comments: Thick white paste in mouth causing dysarthria Eyes:     Extraocular Movements: Extraocular movements intact.     Conjunctiva/sclera: Conjunctivae normal.  Cardiovascular:     Rate and Rhythm: Normal rate.  Pulmonary:     Effort: Pulmonary effort is normal.     Breath sounds: Normal breath sounds.  Abdominal:     General: Abdomen is flat.     Palpations: Abdomen is soft.     Tenderness: There is no abdominal tenderness.   Musculoskeletal:        General: No swelling. Normal range of motion.     Cervical back: Neck supple.  Skin:    General: Skin is warm and dry.  Neurological:     General: No focal deficit present.     Mental Status: She is alert and oriented to person, place, and time.     Cranial Nerves: No cranial nerve deficit.     Sensory: No sensory deficit.     Motor: No weakness.     Coordination: Coordination normal.  Psychiatric:  Mood and Affect: Mood normal.      ED Results / Procedures / Treatments   Labs (all labs ordered are listed, but only abnormal results are displayed) Labs Reviewed  COMPREHENSIVE METABOLIC PANEL - Abnormal; Notable for the following components:      Result Value   Sodium 127 (*)    Potassium 5.3 (*)    Chloride 90 (*)    CO2 18 (*)    Glucose, Bld 117 (*)    BUN 97 (*)    Creatinine, Ser 9.37 (*)    Calcium 8.5 (*)    GFR, Estimated 4 (*)    Anion gap 19 (*)    All other components within normal limits  CBC WITH DIFFERENTIAL/PLATELET - Abnormal; Notable for the following components:   WBC 11.0 (*)    Neutro Abs 9.1 (*)    All other components within normal limits  RESP PANEL BY RT-PCR (FLU A&B, COVID) ARPGX2  ETHANOL  URINALYSIS, ROUTINE W REFLEX MICROSCOPIC    EKG EKG Interpretation  Date/Time:  Wednesday Apr 26 2021 13:22:26 EDT Ventricular Rate:  96 PR Interval:  199 QRS Duration: 102 QT Interval:  356 QTC Calculation: 450 R Axis:   88 Text Interpretation: Sinus rhythm Borderline prolonged PR interval No significant change since last tracing Confirmed by Susy Frizzle [phone]) on 04/26/2021 1:36:54 PM   Radiology CT Head Wo Contrast  Result Date: 04/26/2021 CLINICAL DATA:  Stroke, follow-up; dizziness, nonspecific. Additional provided: Right-sided weakness for 1 day, history of stroke and hypertension. EXAM: CT HEAD WITHOUT CONTRAST TECHNIQUE: Contiguous axial images were obtained from the base of the skull through the vertex  without intravenous contrast. COMPARISON:  Prior head CT examinations 10/06/2020 and earlier. Brain MRI 10/05/2020. FINDINGS: Brain: Cerebral volume is normal. Redemonstrated chronic lacunar infarcts within the left corona radiata/basal ganglia. Known chronic lacunar infarcts within the bilateral thalami were better appreciated on the brain MRI of 10/05/2020. There is no acute intracranial hemorrhage. No acute demarcated cortical infarct. No extra-axial fluid collection. No evidence of intracranial mass. No midline shift. Vascular: No hyperdense vessel.  Atherosclerotic calcifications. Skull: Normal. Negative for fracture or focal lesion. Sinuses/Orbits: Visualized orbits show no acute finding. Tiny right frontoethmoidal sinus osteoma. Otherwise, no significant paranasal sinus disease at the imaged levels. IMPRESSION: No evidence of acute intracranial abnormality. Known chronic lacunar infarcts within the left corona radiata/basal ganglia and bilateral thalami. Electronically Signed   By: Jackey Loge DO   On: 04/26/2021 14:10   CT Renal Stone Study  Result Date: 04/26/2021 CLINICAL DATA:  Right flank pain for 1 day. EXAM: CT ABDOMEN AND PELVIS WITHOUT CONTRAST TECHNIQUE: Multidetector CT imaging of the abdomen and pelvis was performed following the standard protocol without IV contrast. COMPARISON:  05/29/2017 FINDINGS: Lower chest: Dependent atelectasis bilaterally. Heart size normal. No pericardial or pleural effusion. Distal esophageal wall thickening can be seen with gastroesophageal reflux. Hepatobiliary: Liver is unremarkable. Cholecystectomy. Biliary ductal dilatation is likely related. Pancreas: Negative. Spleen: Negative. Adrenals/Urinary Tract: Adrenal glands and kidneys are unremarkable. No urinary stones. Ureters are decompressed. Bladder is relatively low in volume. Stomach/Bowel: Stomach, small bowel and colon are unremarkable. Appendix is not readily visualized. Vascular/Lymphatic:  Atherosclerotic calcification of the aorta. There appears to be an accessory right renal artery, originating from the right common iliac artery. No pathologically enlarged lymph nodes. Reproductive: Uterus is visualized. Heterogeneous lesion in the uterine fundus is 2.4 x 2.9 cm in size, similar to 05/29/2017, indicative of a fibroid. No adnexal mass. Other:  No free fluid.  Mesenteries and peritoneum are unremarkable. Musculoskeletal: Degenerative changes in the spine. No worrisome lytic or sclerotic lesions. IMPRESSION: 1. No findings to explain the patient's right flank pain. 2. Aortic atherosclerosis (ICD10-I70.0). Electronically Signed   By: Leanna Battles M.D.   On: 04/26/2021 15:33    Procedures Procedures  Medications Ordered in the ED Medications  sodium chloride 0.9 % bolus 1,000 mL (0 mLs Intravenous Stopped 04/26/21 1530)     MDM Rules/Calculators/A&P MDM Patient with dizziness, R flank pain, increased somnolence since yesterday. No focal neuro deficits to suggest acute stroke, but given history will send for CT. Check labs, give IVF. Her initial BP was normal but low in the exam room. She states she took her BP meds just before coming to the ED.  ED Course  I have reviewed the triage vital signs and the nursing notes.  Pertinent labs & imaging results that were available during my care of the patient were reviewed by me and considered in my medical decision making (see chart for details).  Clinical Course as of 04/26/21 1707  Wed Apr 26, 2021  1336 CBC with mild leukocytosis.  [CS]  1412 CMP with marked renal dysfunction, baseline is normal. Will continue with IVF, send for CT to eval signs of hydronephrosis given her recent flank pain.  [CS]  1442 CT head without acute change.  [CS]  1531 Spoke with Dr. Wendall Stade, Hospitalist, who will evaluate for admission.  [CS]  1543 CT neg for kidney stone or hydronephrosis.  [CS]    Clinical Course User Index [CS] Pollyann Savoy,  MD    Final Clinical Impression(s) / ED Diagnoses Final diagnoses:  Altered mental status, unspecified altered mental status type  AKI (acute kidney injury) (HCC)  Flank pain    Rx / DC Orders ED Discharge Orders    None       Pollyann Savoy, MD 04/26/21 [phone]

## 2021-04-26 NOTE — ED Notes (Signed)
Pt taken to CT for CT renal study.

## 2021-04-26 NOTE — ED Notes (Signed)
IV infiltrated. Attempting to restart and gain IV access.

## 2021-04-26 NOTE — ED Triage Notes (Signed)
Pt to er, pt states that she is here because she can't remember stuff, states that her husband has told her that this is since yesterday.  Pt also c/o R sided weakness since yesterday as well.  Asked pt rep to get husband to help with triage process.

## 2021-04-26 NOTE — Progress Notes (Signed)
MD notified of current BP 220/205, no current PRN orders

## 2021-04-27 DIAGNOSIS — N179 Acute kidney failure, unspecified: Secondary | ICD-10-CM | POA: Diagnosis not present

## 2021-04-27 LAB — BASIC METABOLIC PANEL
Anion gap: 13 (ref 5–15)
BUN: 85 mg/dL — ABNORMAL HIGH (ref 6–20)
CO2: 17 mmol/L — ABNORMAL LOW (ref 22–32)
Calcium: 7.8 mg/dL — ABNORMAL LOW (ref 8.9–10.3)
Chloride: 104 mmol/L (ref 98–111)
Creatinine, Ser: 5.96 mg/dL — ABNORMAL HIGH (ref 0.44–1.00)
GFR, Estimated: 8 mL/min — ABNORMAL LOW (ref >60)
Glucose, Bld: 57 mg/dL — ABNORMAL LOW (ref 70–99)
Potassium: 4.9 mmol/L (ref 3.5–5.1)
Sodium: 134 mmol/L — ABNORMAL LOW (ref 135–145)

## 2021-04-27 LAB — HEMOGLOBIN A1C
Hgb A1c MFr Bld: 6.5 % — ABNORMAL HIGH (ref 4.8–5.6)
Mean Plasma Glucose: 139.85 mg/dL

## 2021-04-27 LAB — GLUCOSE, CAPILLARY
Glucose-Capillary: 62 mg/dL — ABNORMAL LOW (ref 70–99)
Glucose-Capillary: 79 mg/dL (ref 70–99)
Glucose-Capillary: 100 mg/dL — ABNORMAL HIGH (ref 70–99)

## 2021-04-27 LAB — MRSA PCR SCREENING: MRSA by PCR: POSITIVE — AB

## 2021-04-27 LAB — CBC
HCT: 39.6 % (ref 36.0–46.0)
Hemoglobin: 13.3 g/dL (ref 12.0–15.0)
MCH: 33.2 pg (ref 26.0–34.0)
MCHC: 33.6 g/dL (ref 30.0–36.0)
MCV: 98.8 fL (ref 80.0–100.0)
Platelets: 148 10*3/uL — ABNORMAL LOW (ref 150–400)
RBC: 4.01 MIL/uL (ref 3.87–5.11)
RDW: 12.2 % (ref 11.5–15.5)
WBC: 9 10*3/uL (ref 4.0–10.5)
nRBC: 0 % (ref 0.0–0.2)

## 2021-04-27 MED ORDER — SODIUM BICARBONATE 8.4 % IV SOLN
INTRAVENOUS | Status: DC
  Filled 2021-04-27 (×11): qty 1000

## 2021-04-27 MED ORDER — INSULIN ASPART 100 UNIT/ML IJ SOLN: 0.0000 [IU] | Freq: Every day | INTRAMUSCULAR | Status: DC

## 2021-04-27 MED ORDER — METOPROLOL TARTRATE 25 MG PO TABS
25.0000 mg | ORAL_TABLET | Freq: Two times a day (BID) | ORAL | Status: DC
  Administered 2021-04-27 – 2021-04-28 (×3): 25 mg via ORAL
  Filled 2021-04-27 (×3): qty 1

## 2021-04-27 MED ORDER — CLOPIDOGREL BISULFATE 75 MG PO TABS
75.0000 mg | ORAL_TABLET | Freq: Every day | ORAL | Status: DC
  Administered 2021-04-27 – 2021-04-28 (×2): 75 mg via ORAL
  Filled 2021-04-27 (×2): qty 1

## 2021-04-27 MED ORDER — HYDRALAZINE HCL 20 MG/ML IJ SOLN: 10.0000 mg | Freq: Four times a day (QID) | INTRAMUSCULAR | Status: DC | PRN

## 2021-04-27 MED ORDER — INSULIN ASPART 100 UNIT/ML IJ SOLN
0.0000 [IU] | Freq: Three times a day (TID) | INTRAMUSCULAR | Status: DC
  Administered 2021-04-28 (×3): 1 [IU] via SUBCUTANEOUS

## 2021-04-27 MED ORDER — MIRTAZAPINE 15 MG PO TABS
15.0000 mg | ORAL_TABLET | Freq: Every day | ORAL | Status: DC
  Administered 2021-04-27: 15 mg via ORAL
  Filled 2021-04-27: qty 1

## 2021-04-27 MED ORDER — ASPIRIN EC 81 MG PO TBEC
81.0000 mg | DELAYED_RELEASE_TABLET | Freq: Every day | ORAL | Status: DC
  Administered 2021-04-27 – 2021-04-28 (×2): 81 mg via ORAL
  Filled 2021-04-27 (×2): qty 1

## 2021-04-27 MED ORDER — SODIUM CHLORIDE 0.9 % IV SOLN
1.0000 g | INTRAVENOUS | Status: DC
  Administered 2021-04-27 – 2021-04-28 (×2): 1 g via INTRAVENOUS
  Filled 2021-04-27 (×2): qty 10

## 2021-04-27 MED ORDER — ATORVASTATIN CALCIUM 40 MG PO TABS
40.0000 mg | ORAL_TABLET | Freq: Every day | ORAL | Status: DC
  Administered 2021-04-27 – 2021-04-28 (×2): 40 mg via ORAL
  Filled 2021-04-27 (×2): qty 1

## 2021-04-27 MED ORDER — CHLORHEXIDINE GLUCONATE CLOTH 2 % EX PADS
6.0000 | MEDICATED_PAD | Freq: Every day | CUTANEOUS | Status: DC
  Administered 2021-04-27 – 2021-04-28 (×2): 6 via TOPICAL

## 2021-04-27 NOTE — Progress Notes (Signed)
Patient Demographics:    Judith Sullivan, is a 60 y.o. female, DOB - April 03, 1961, WTU:[id number]  Admit date - 04/26/2021   Admitting Physician Ejiroghene Wendall Stade, MD  Outpatient Primary MD for the patient is Ignatius Specking, MD  LOS - 1   Chief Complaint  Patient presents with  . Altered Mental Status        Subjective:    [name] today has no fevers, no emesis,  No chest pain,   -Husband and daughter at bedside, questions answered -Patient is less lethargic, requesting oral intake -Right flank pain apparently improved  Assessment  & Plan :    Principal Problem:   AKI (acute kidney injury) (HCC) Active Problems:   Acute metabolic encephalopathy   Cerebrovascular accident (CVA) (HCC)   Seizures (HCC)  Brief Summary:-  60 y.o. female with medical history significant for prior CVA, seizures, COPD/asthma, HTN, DM and depressive disorder admitted on 04/26/2021 with acute metabolic encephalopathy in the setting of significant dehydration with AKI and electrolyte abnormalities  A/p 1)acute kidney injury on CKD stage -due to dehydration see result of the combination of nausea vomiting and diarrhea, poor oral intake compounded by lisinopril use -  creatinine on admission=  9.37, baseline creatinine = 0.6 to 0.8   ,  creatinine is now= 5.96 (with hydration),  --renally adjust medications, avoid nephrotoxic agents / dehydration  / hypotension -Continue to hold lisinopril and metformin -Metabolic alkalosis persist so will give IV bicarb drip as ordered -Nephrology input appreciated  2)Hyperkalemia/hyponatremia and hypochloremia--- hypokalemia and hypochloremia resolved with hydration and improvement in renal function -Hyponatremia improving  3) possible UTI--urine may be contaminated, treat empirically with Rocephin pending culture data  4)DM2-last A1c was 6.5% reflecting excellent diabetic control  PTA --hold Januvia, hold metformin, Use Novolog/Humalog Sliding scale insulin with Accu-Cheks/Fingersticks as ordered   5)H/o Prior CVA-okay to continue Lipitor, aspirin and Plavix for secondary stroke prophylaxis  6)Seizure DO--- continue Keppra  7)Acute Metabolic Encephalopathy--- improving, most likely due to #1 above  Disposition/Need for in-Hospital Stay- patient unable to be discharged at this time due to -significant dehydration with AKI, metabolic acidosis and significant electrolyte abnormalities requiring IV ---*  Status is: Inpatient  Remains inpatient appropriate because:Please see disposition above   Disposition: The patient is from: Home              Anticipated d/c is to: Home              Anticipated d/c date is: 2 days              Patient currently is not medically stable to d/c. Barriers: Not Clinically Stable-   Code Status : = -  Code Status: Full Code   Family Communication:   (patient is alert, awake and coherent)  Discussed with husband and daughter at bedside  Consults  : Nephrology  DVT Prophylaxis  :   - SCDs   SCDs Start: 04/26/21 1951    Lab Results  Component Value Date   PLT 148 (L) 04/27/2021    Inpatient Medications  Scheduled Meds: . Chlorhexidine Gluconate Cloth  6 each Topical Daily   Continuous Infusions: . cefTRIAXone (ROCEPHIN)  IV    . levETIRAcetam 500 mg (04/27/21 0005)  .  sodium bicarbonate 150 mEq in D5W infusion 125 mL/hr at 04/27/21 1137   PRN Meds:.acetaminophen **OR** acetaminophen, albuterol, ondansetron **OR** ondansetron (ZOFRAN) IV    Anti-infectives (From admission, onward)   Start     Dose/Rate Route Frequency Ordered Stop   04/27/21 1315  cefTRIAXone (ROCEPHIN) 1 g in sodium chloride 0.9 % 100 mL IVPB        1 g 200 mL/hr over 30 Minutes Intravenous Every 24 hours 04/27/21 1226          Objective:   Vitals:   04/26/21 2100 04/26/21 2130 04/26/21 2300 04/27/21 0441  BP: (!) 220/205 (!) 188/171 (!)  139/114 (!) 129/102  Pulse: 62  90 84  Resp: 20  20 15   Temp: 98.6 F (37 C)  99.1 F (37.3 C) 98 F (36.7 C)  TempSrc: Axillary  Oral Oral  SpO2: 94%  95% (!) 58%  Weight:      Height:        Wt Readings from Last 3 Encounters:  04/26/21 53.3 kg  11/10/20 47.6 kg  10/12/20 45 kg     Intake/Output Summary (Last 24 hours) at 04/27/2021 1227 Last data filed at 04/27/2021 0446 Gross per 24 hour  Intake 2646.88 ml  Output 1470 ml  Net 1176.88 ml   Physical Exam  Gen:- Awake Alert,  In no apparent distress  HEENT:- [place].AT, No sclera icterus Neck-Supple Neck,No JVD,.  Lungs-  CTAB , fair symmetrical air movement CV- S1, S2 normal, regular  Abd-  +ve B.Sounds, Abd Soft, No tenderness, no CVA tenderness  Extremity/Skin:- No  edema, pedal pulses present  Psych-affect is flat, oriented x3 Neuro-generalized weakness no new focal deficits, no tremors GU--Foley in situ--to be removed 04/27/2021   Data Review:   Micro Results Recent Results (from the past 240 hour(s))  Resp Panel by RT-PCR (Flu A&B, Covid) Nasopharyngeal Swab     Status: None   Collection Time: 04/26/21  4:07 PM   Specimen: Nasopharyngeal Swab; Nasopharyngeal(NP) swabs in vial transport medium  Result Value Ref Range Status   SARS Coronavirus 2 by RT PCR NEGATIVE NEGATIVE Final    Comment: (NOTE) SARS-CoV-2 target nucleic acids are NOT DETECTED.  The SARS-CoV-2 RNA is generally detectable in upper respiratory specimens during the acute phase of infection. The lowest concentration of SARS-CoV-2 viral copies this assay can detect is 138 copies/mL. A negative result does not preclude SARS-Cov-2 infection and should not be used as the sole basis for treatment or other patient management decisions. A negative result may occur with  improper specimen collection/handling, submission of specimen other than nasopharyngeal swab, presence of viral mutation(s) within the areas targeted by this assay, and inadequate  number of viral copies(<138 copies/mL). A negative result must be combined with clinical observations, patient history, and epidemiological information. The expected result is Negative.  Fact Sheet for Patients:  04/28/21  Fact Sheet for Healthcare Providers:  BloggerCourse.com  This test is no t yet approved or cleared by the [place] FDA and  has been authorized for detection and/or diagnosis of SARS-CoV-2 by FDA under an Emergency Use Authorization (EUA). This EUA will remain  in effect (meaning this test can be used) for the duration of the COVID-19 declaration under Section 564(b)(1) of the Act, 21 U.S.C.section 360bbb-3(b)(1), unless the authorization is terminated  or revoked sooner.       Influenza A by PCR NEGATIVE NEGATIVE Final   Influenza B by PCR NEGATIVE NEGATIVE Final    Comment: (NOTE)  The Xpert Xpress SARS-CoV-2/FLU/RSV plus assay is intended as an aid in the diagnosis of influenza from Nasopharyngeal swab specimens and should not be used as a sole basis for treatment. Nasal washings and aspirates are unacceptable for Xpert Xpress SARS-CoV-2/FLU/RSV testing.  Fact Sheet for Patients: BloggerCourse.comhttps://www.fda.gov/media/152166/download  Fact Sheet for Healthcare Providers: SeriousBroker.ithttps://www.fda.gov/media/152162/download  This test is not yet approved or cleared by the [place] FDA and has been authorized for detection and/or diagnosis of SARS-CoV-2 by FDA under an Emergency Use Authorization (EUA). This EUA will remain in effect (meaning this test can be used) for the duration of the COVID-19 declaration under Section 564(b)(1) of the Act, 21 U.S.C. section 360bbb-3(b)(1), unless the authorization is terminated or revoked.  Performed at [facility], [address] [place], [place] [address]   MRSA PCR Screening     Status: Abnormal   Collection Time: 04/27/21 12:59 AM   Specimen: Nasopharyngeal   Result Value Ref Range Status   MRSA by PCR POSITIVE (A) NEGATIVE Final    Comment:        The GeneXpert MRSA Assay (FDA approved for NASAL specimens only), is one component of a comprehensive MRSA colonization surveillance program. It is not intended to diagnose MRSA infection nor to guide or monitor treatment for MRSA infections. RESULT CALLED TO, READ BACK BY AND VERIFIED WITH: L COLEMAN 5/19 @ 0949 BY S BEARD Performed at [facility], [address] [place], [place] [address]     Radiology Reports CT Head Wo Contrast  Result Date: 04/26/2021 CLINICAL DATA:  Stroke, follow-up; dizziness, nonspecific. Additional provided: Right-sided weakness for 1 day, history of stroke and hypertension. EXAM: CT HEAD WITHOUT CONTRAST TECHNIQUE: Contiguous axial images were obtained from the base of the skull through the vertex without intravenous contrast. COMPARISON:  Prior head CT examinations 10/06/2020 and earlier. Brain MRI 10/05/2020. FINDINGS: Brain: Cerebral volume is normal. Redemonstrated chronic lacunar infarcts within the left corona radiata/basal ganglia. Known chronic lacunar infarcts within the bilateral thalami were better appreciated on the brain MRI of 10/05/2020. There is no acute intracranial hemorrhage. No acute demarcated cortical infarct. No extra-axial fluid collection. No evidence of intracranial mass. No midline shift. Vascular: No hyperdense vessel.  Atherosclerotic calcifications. Skull: Normal. Negative for fracture or focal lesion. Sinuses/Orbits: Visualized orbits show no acute finding. Tiny right frontoethmoidal sinus osteoma. Otherwise, no significant paranasal sinus disease at the imaged levels. IMPRESSION: No evidence of acute intracranial abnormality. Known chronic lacunar infarcts within the left corona radiata/basal ganglia and bilateral thalami. Electronically Signed   By: Jackey LogeKyle  Golden DO   On: 04/26/2021 14:10   DG CHEST PORT 1 VIEW  Result Date:  04/26/2021 CLINICAL DATA:  Respiratory failure and hypoxia EXAM: PORTABLE CHEST 1 VIEW COMPARISON:  10/08/2020 FINDINGS: Cardiac shadow is within normal limits. Loop recorder is seen. Postsurgical changes in the cervical spine are noted. Lungs are clear bilaterally. No bony abnormality is seen. IMPRESSION: No active disease. Electronically Signed   By: Alcide CleverMark  Lukens M.D.   On: 04/26/2021 21:51   CT Renal Stone Study  Result Date: 04/26/2021 CLINICAL DATA:  Right flank pain for 1 day. EXAM: CT ABDOMEN AND PELVIS WITHOUT CONTRAST TECHNIQUE: Multidetector CT imaging of the abdomen and pelvis was performed following the standard protocol without IV contrast. COMPARISON:  05/29/2017 FINDINGS: Lower chest: Dependent atelectasis bilaterally. Heart size normal. No pericardial or pleural effusion. Distal esophageal wall thickening can be seen with gastroesophageal reflux. Hepatobiliary: Liver is unremarkable. Cholecystectomy. Biliary ductal dilatation is likely related. Pancreas:  Negative. Spleen: Negative. Adrenals/Urinary Tract: Adrenal glands and kidneys are unremarkable. No urinary stones. Ureters are decompressed. Bladder is relatively low in volume. Stomach/Bowel: Stomach, small bowel and colon are unremarkable. Appendix is not readily visualized. Vascular/Lymphatic: Atherosclerotic calcification of the aorta. There appears to be an accessory right renal artery, originating from the right common iliac artery. No pathologically enlarged lymph nodes. Reproductive: Uterus is visualized. Heterogeneous lesion in the uterine fundus is 2.4 x 2.9 cm in size, similar to 05/29/2017, indicative of a fibroid. No adnexal mass. Other: No free fluid.  Mesenteries and peritoneum are unremarkable. Musculoskeletal: Degenerative changes in the spine. No worrisome lytic or sclerotic lesions. IMPRESSION: 1. No findings to explain the patient's right flank pain. 2. Aortic atherosclerosis (ICD10-I70.0). Electronically Signed   By: Leanna Battles M.D.   On: 04/26/2021 15:33     CBC Recent Labs  Lab 04/26/21 1304 04/27/21 0703  WBC 11.0* 9.0  HGB 13.8 13.3  HCT 40.4 39.6  PLT 192 148*  MCV 96.2 98.8  MCH 32.9 33.2  MCHC 34.2 33.6  RDW 12.4 12.2  LYMPHSABS 1.2  --   MONOABS 0.6  --   EOSABS 0.0  --   BASOSABS 0.0  --     Chemistries  Recent Labs  Lab 04/26/21 1304 04/27/21 0703  NA 127* 134*  K 5.3* 4.9  CL 90* 104  CO2 18* 17*  GLUCOSE 117* 57*  BUN 97* 85*  CREATININE 9.37* 5.96*  CALCIUM 8.5* 7.8*  AST 34  --   ALT 25  --   ALKPHOS 84  --   BILITOT 0.5  --    ------------------------------------------------------------------------------------------------------------------ No results for input(s): CHOL, HDL, LDLCALC, TRIG, CHOLHDL, LDLDIRECT in the last 72 hours.  Lab Results  Component Value Date   HGBA1C 6.5 (H) 10/04/2020   ------------------------------------------------------------------------------------------------------------------ No results for input(s): TSH, T4TOTAL, T3FREE, THYROIDAB in the last 72 hours.  Invalid input(s): FREET3 ------------------------------------------------------------------------------------------------------------------ No results for input(s): VITAMINB12, FOLATE, FERRITIN, TIBC, IRON, RETICCTPCT in the last 72 hours.  Coagulation profile No results for input(s): INR, PROTIME in the last 168 hours.  No results for input(s): DDIMER in the last 72 hours.  Cardiac Enzymes No results for input(s): CKMB, TROPONINI, MYOGLOBIN in the last 168 hours.  Invalid input(s): CK ------------------------------------------------------------------------------------------------------------------ No results found for: BNP   Shon Hale M.D on 04/27/2021 at 12:27 PM  Go to [email or web address] - for contact info  [facility] - Office  [phone]

## 2021-04-27 NOTE — Consult Note (Signed)
[facility] Renal Consultation Note  Requesting MD:  Indication for Consultation: Acute kidney injury, maintenance euvolemia, assessment treatment of electrolyte abnormalities, assessment treatment of acid-base abnormalities.  HPI:   Judith Sullivan is a 60 y.o. female.  History of CVA and seizures.  She was admitted with vomiting and diarrhea.  Poor p.o. intake.  Occasional Advil use.  She was hypotensive and dehydrated in the emergency room.  Requiring 1 L bolus of IV fluids.  She has no prior history of renal disease with previous creatinine 0.6 to milligrams per deciliter.  Home medications: Amitriptyline 25 mg nightly, aspirin 325 mg daily, atorvastatin 40 mg daily, Plavix 75 mg daily Neurontin 800 mg 3 times daily, Keppra 500 mg twice daily, lisinopril 20 mg daily, metformin 1 g / 500 mg.  Metoprolol 50 mg twice daily, oxycodone, Januvia 100 mg daily, Valcyte 1000 mg daily, Zanaflex 4 mg every 8 hours as needed.  Blood pressure 129/102 pulse 84 temperature 98 O2 sats 95% room air.  Urine output 650 cc 04/27/2021.  Sodium 134 potassium 4.9 chloride 104 CO2 17 BUN 85 creatinine 5.96 glucose 57 calcium 7.8 hemoglobin 13.3  Urine 0-5 RBCs 6-10 WBCs   Chest x-ray no active disease   Creatinine, Ser  Date/Time Value Ref Range Status  04/27/2021 07:03 AM 5.96 (H) 0.44 - 1.00 mg/dL Final  60/73/7106 26:94 PM 9.37 (H) 0.44 - 1.00 mg/dL Final  85/46/2703 50:09 AM 0.62 0.44 - 1.00 mg/dL Final  38/18/2993 71:69 AM 0.68 0.44 - 1.00 mg/dL Final  67/89/3810 17:51 AM 0.83 0.44 - 1.00 mg/dL Final  02/58/5277 82:42 AM 0.90 0.44 - 1.00 mg/dL Final  35/36/1443 15:40 AM 0.88 0.44 - 1.00 mg/dL Final  08/67/6195 09:32 AM 0.81 0.44 - 1.00 mg/dL Final  67/11/4579 99:83 AM 0.93 0.44 - 1.00 mg/dL Final  38/25/0539 76:73 AM 0.90 0.44 - 1.00 mg/dL Final  41/93/7902 40:97 AM 0.71  Final     PMHx:   Past Medical History:  Diagnosis Date  . Asthma with bronchitis   . Backache   . COPD  (chronic obstructive pulmonary disease) (HCC)   . CTS (carpal tunnel syndrome)   . Hypertension   . Kidney stones   . Stroke [facility])     Past Surgical History:  Procedure Laterality Date  . BUBBLE STUDY  10/12/2020   Procedure: BUBBLE STUDY;  Surgeon: Jake Bathe, MD;  Location: [facility] ENDOSCOPY;  Service: Cardiovascular;;  . CESAREAN SECTION    . CHOLECYSTECTOMY    . kidney stone removal    . LOOP RECORDER INSERTION N/A 10/12/2020   Procedure: LOOP RECORDER INSERTION;  Surgeon: Regan Lemming, MD;  Location: [facility] INVASIVE CV LAB;  Service: Cardiovascular;  Laterality: N/A;  . neck fusion    . SHOULDER SURGERY    . TEE WITHOUT CARDIOVERSION N/A 10/12/2020   Procedure: TRANSESOPHAGEAL ECHOCARDIOGRAM (TEE);  Surgeon: Jake Bathe, MD;  Location: [facility] ENDOSCOPY;  Service: Cardiovascular;  Laterality: N/A;    Family Hx:  Family History  Problem Relation Age of Onset  . Stroke Mother   . Hypertension Mother   . Heart attack Father     Social History:  reports that she has been smoking. She has a 35.00 pack-year smoking history. She has never used smokeless tobacco. She reports previous alcohol use. She reports that she does not use drugs.  Allergies: No Known Allergies  Medications: Prior to Admission medications   Medication Sig Start Date End Date Taking? Authorizing Provider  albuterol (  VENTOLIN HFA) 108 (90 Base) MCG/ACT inhaler Inhale 2 puffs into the lungs every 6 (six) hours as needed for wheezing or shortness of breath.   Yes [provider]  amitriptyline (ELAVIL) 25 MG tablet Take 25 mg by mouth at bedtime. 01/29/19  Yes [provider]  aspirin 325 MG EC tablet Take 325 mg by mouth daily.   Yes [provider]  atorvastatin (LIPITOR) 40 MG tablet Take 1 tablet (40 mg total) by mouth daily. 10/13/20 01/11/21 Yes Dahal, Melina Schools, MD  Cholecalciferol 25 MCG (1000 UT) tablet Take 1,000 Units by mouth daily.   Yes [provider]  clopidogrel  (PLAVIX) 75 MG tablet Take 75 mg by mouth daily.   Yes [provider]  cyanocobalamin 100 MCG tablet Take 100 mcg by mouth daily.   Yes [provider]  gabapentin (NEURONTIN) 800 MG tablet Take 800 mg by mouth 3 (three) times daily. 01/09/19  Yes [provider]  levETIRAcetam (KEPPRA) 500 MG tablet Take 1 tablet (500 mg total) by mouth 2 (two) times daily. 10/12/20 01/10/21 Yes Dahal, Melina Schools, MD  lisinopril (ZESTRIL) 20 MG tablet Take 20 mg by mouth daily. 11/24/18  Yes [provider]  metFORMIN (GLUCOPHAGE) 500 MG tablet Take by mouth 2 (two) times daily with a meal. Take 2 po qam and 1 po qhs.   Yes [provider]  metoprolol tartrate (LOPRESSOR) 50 MG tablet Take 50 mg by mouth 2 (two) times daily. 03/13/21  Yes [provider]  oxyCODONE (ROXICODONE) 15 MG immediate release tablet Take 15 mg by mouth 4 (four) times daily as needed. 01/09/19  Yes [provider]  sitaGLIPtin (JANUVIA) 100 MG tablet Take 100 mg by mouth daily.   Yes [provider]  tiZANidine (ZANAFLEX) 4 MG tablet Take 4 mg by mouth every 8 (eight) hours as needed. 01/09/19  Yes [provider]  valACYclovir (VALTREX) 1000 MG tablet Take 1,000 mg by mouth daily. 09/05/20  Yes [provider]  amoxicillin-clavulanate (AUGMENTIN) 500-125 MG tablet Take 1 tablet by mouth 2 (two) times daily. Patient not taking: No sig reported 06/15/20   [provider]  metoprolol tartrate (LOPRESSOR) 25 MG tablet Take 1 tablet (25 mg total) by mouth 2 (two) times daily. Patient not taking: Reported on 04/26/2021 10/12/20 01/10/21  Lorin Glass, MD  mirtazapine (REMERON) 15 MG tablet Take 15 mg by mouth at bedtime. Patient not taking: Reported on 04/26/2021 03/06/21   [provider]     Labs:  Results for orders placed or performed during the hospital encounter of 04/26/21 (from the past 48 hour(s))  Comprehensive metabolic panel     Status:  Abnormal   Collection Time: 04/26/21  1:04 PM  Result Value Ref Range   Sodium 127 (L) 135 - 145 mmol/L   Potassium 5.3 (H) 3.5 - 5.1 mmol/L   Chloride 90 (L) 98 - 111 mmol/L   CO2 18 (L) 22 - 32 mmol/L   Glucose, Bld 117 (H) 70 - 99 mg/dL    Comment: Glucose reference range applies only to samples taken after fasting for at least 8 hours.   BUN 97 (H) 6 - 20 mg/dL   Creatinine, Ser 9.48 (H) 0.44 - 1.00 mg/dL   Calcium 8.5 (L) 8.9 - 10.3 mg/dL   Total Protein 7.0 6.5 - 8.1 g/dL   Albumin 4.4 3.5 - 5.0 g/dL   AST 34 15 - 41 U/L   ALT 25 0 - 44 U/L  Alkaline Phosphatase 84 38 - 126 U/L   Total Bilirubin 0.5 0.3 - 1.2 mg/dL   GFR, Estimated 4 (L) >60 mL/min    Comment: (NOTE) Calculated using the CKD-EPI Creatinine Equation (2021)    Anion gap 19 (H) 5 - 15    Comment: Performed at [facility], [address] [place], [place] [address]  Ethanol     Status: None   Collection Time: 04/26/21  1:04 PM  Result Value Ref Range   Alcohol, Ethyl (B) <10 <10 mg/dL    Comment: (NOTE) Lowest detectable limit for serum alcohol is 10 mg/dL.  For medical purposes only. Performed at [facility], [address], [place], [place] [address]   CBC with Differential     Status: Abnormal   Collection Time: 04/26/21  1:04 PM  Result Value Ref Range   WBC 11.0 (H) 4.0 - 10.5 K/uL   RBC 4.20 3.87 - 5.11 MIL/uL   Hemoglobin 13.8 12.0 - 15.0 g/dL   HCT 42.5 95.6 - 38.7 %   MCV 96.2 80.0 - 100.0 fL   MCH 32.9 26.0 - 34.0 pg   MCHC 34.2 30.0 - 36.0 g/dL   RDW 56.4 33.2 - 95.1 %   Platelets 192 150 - 400 K/uL   nRBC 0.0 0.0 - 0.2 %   Neutrophils Relative % 83 %   Neutro Abs 9.1 (H) 1.7 - 7.7 K/uL   Lymphocytes Relative 11 %   Lymphs Abs 1.2 0.7 - 4.0 K/uL   Monocytes Relative 5 %   Monocytes Absolute 0.6 0.1 - 1.0 K/uL   Eosinophils Relative 0 %   Eosinophils Absolute 0.0 0.0 - 0.5 K/uL   Basophils Relative 0 %   Basophils Absolute 0.0 0.0 - 0.1 K/uL   Immature Granulocytes 1 %   Abs  Immature Granulocytes 0.07 0.00 - 0.07 K/uL    Comment: Performed at [facility], [address], [place], [place] [address]  Resp Panel by RT-PCR (Flu A&B, Covid) Nasopharyngeal Swab     Status: None   Collection Time: 04/26/21  4:07 PM   Specimen: Nasopharyngeal Swab; Nasopharyngeal(NP) swabs in vial transport medium  Result Value Ref Range   SARS Coronavirus 2 by RT PCR NEGATIVE NEGATIVE    Comment: (NOTE) SARS-CoV-2 target nucleic acids are NOT DETECTED.  The SARS-CoV-2 RNA is generally detectable in upper respiratory specimens during the acute phase of infection. The lowest concentration of SARS-CoV-2 viral copies this assay can detect is 138 copies/mL. A negative result does not preclude SARS-Cov-2 infection and should not be used as the sole basis for treatment or other patient management decisions. A negative result may occur with  improper specimen collection/handling, submission of specimen other than nasopharyngeal swab, presence of viral mutation(s) within the areas targeted by this assay, and inadequate number of viral copies(<138 copies/mL). A negative result must be combined with clinical observations, patient history, and epidemiological information. The expected result is Negative.  Fact Sheet for Patients:  BloggerCourse.com  Fact Sheet for Healthcare Providers:  SeriousBroker.it  This test is no t yet approved or cleared by the [place] FDA and  has been authorized for detection and/or diagnosis of SARS-CoV-2 by FDA under an Emergency Use Authorization (EUA). This EUA will remain  in effect (meaning this test can be used) for the duration of the COVID-19 declaration under Section 564(b)(1) of the Act, 21 U.S.C.section 360bbb-3(b)(1), unless the authorization is terminated  or revoked sooner.       Influenza A  by PCR NEGATIVE NEGATIVE   Influenza B by PCR NEGATIVE NEGATIVE    Comment: (NOTE) The Xpert  Xpress SARS-CoV-2/FLU/RSV plus assay is intended as an aid in the diagnosis of influenza from Nasopharyngeal swab specimens and should not be used as a sole basis for treatment. Nasal washings and aspirates are unacceptable for Xpert Xpress SARS-CoV-2/FLU/RSV testing.  Fact Sheet for Patients: BloggerCourse.com  Fact Sheet for Healthcare Providers: SeriousBroker.it  This test is not yet approved or cleared by the [place] FDA and has been authorized for detection and/or diagnosis of SARS-CoV-2 by FDA under an Emergency Use Authorization (EUA). This EUA will remain in effect (meaning this test can be used) for the duration of the COVID-19 declaration under Section 564(b)(1) of the Act, 21 U.S.C. section 360bbb-3(b)(1), unless the authorization is terminated or revoked.  Performed at [facility], [address], [place], [place] [address]   Urinalysis, Routine w reflex microscopic Urine, Catheterized     Status: Abnormal   Collection Time: 04/26/21  5:50 PM  Result Value Ref Range   Color, Urine YELLOW YELLOW   APPearance CLOUDY (A) CLEAR   Specific Gravity, Urine 1.013 1.005 - 1.030   pH 5.0 5.0 - 8.0   Glucose, UA NEGATIVE NEGATIVE mg/dL   Hgb urine dipstick MODERATE (A) NEGATIVE   Bilirubin Urine NEGATIVE NEGATIVE   Ketones, ur NEGATIVE NEGATIVE mg/dL   Protein, ur 30 (A) NEGATIVE mg/dL   Nitrite NEGATIVE NEGATIVE   Leukocytes,Ua NEGATIVE NEGATIVE   RBC / HPF 0-5 0 - 5 RBC/hpf   WBC, UA 6-10 0 - 5 WBC/hpf   Bacteria, UA RARE (A) NONE SEEN   Squamous Epithelial / LPF 11-20 0 - 5   Mucus PRESENT    Hyaline Casts, UA PRESENT    Ca Oxalate Crys, UA PRESENT     Comment: Performed at [facility], [address], [place], [place] [address]  Protein / creatinine ratio, urine     Status: Abnormal   Collection Time: 04/26/21  6:29 PM  Result Value Ref Range   Creatinine, Urine 120.10 mg/dL   Total Protein, Urine 61 mg/dL     Comment: NO NORMAL RANGE ESTABLISHED FOR THIS TEST   Protein Creatinine Ratio 0.51 (H) 0.00 - 0.15 mg/mg[Cre]    Comment: Performed at [facility], [address], [place], [place] [address]  Basic metabolic panel     Status: Abnormal   Collection Time: 04/27/21  7:03 AM  Result Value Ref Range   Sodium 134 (L) 135 - 145 mmol/L    Comment: DELTA CHECK NOTED   Potassium 4.9 3.5 - 5.1 mmol/L   Chloride 104 98 - 111 mmol/L   CO2 17 (L) 22 - 32 mmol/L   Glucose, Bld 57 (L) 70 - 99 mg/dL    Comment: Glucose reference range applies only to samples taken after fasting for at least 8 hours.   BUN 85 (H) 6 - 20 mg/dL   Creatinine, Ser 9.14 (H) 0.44 - 1.00 mg/dL   Calcium 7.8 (L) 8.9 - 10.3 mg/dL   GFR, Estimated 8 (L) >60 mL/min    Comment: (NOTE) Calculated using the CKD-EPI Creatinine Equation (2021)    Anion gap 13 5 - 15    Comment: Performed at [facility], [address] [place], [place] [address]  CBC     Status: Abnormal   Collection Time: 04/27/21  7:03 AM  Result Value Ref Range   WBC 9.0 4.0 - 10.5 K/uL   RBC  4.01 3.87 - 5.11 MIL/uL   Hemoglobin 13.3 12.0 - 15.0 g/dL   HCT 81.139.6 91.436.0 - 78.246.0 %   MCV 98.8 80.0 - 100.0 fL   MCH 33.2 26.0 - 34.0 pg   MCHC 33.6 30.0 - 36.0 g/dL   RDW 95.612.2 21.311.5 - 08.615.5 %   Platelets 148 (L) 150 - 400 K/uL   nRBC 0.0 0.0 - 0.2 %    Comment: Performed at [facility], [address] [place], [place] [id number]     ROS:  General: Somnolent but arousable. Eyes no visual complaints Ear nose mouth throat no hearing loss epistaxis Cardiovascular: No angina orthopnea no ankle leg swellings Respiratory: No cough wheeze hemoptysis Abdominal: No abdominal pain Urogenital: No urgency frequency dysuria Neurologic: History of stroke history of seizures history of hypertension Dermatologic: No skin rash  Physical Exam: Vitals:   04/26/21 2300 04/27/21 0441  BP: (!) 139/114 (!) 129/102  Pulse: 90 84  Resp: 20 15  Temp: 99.1 F (37.3 C) 98 F (36.7  C)  SpO2: 95% (!) 58%     General: Dehydrated mucous membranes dry HEENT: Normocephalic  Eyes: Pupils round equal reactive Neck: Supple JVP not elevated Heart: Tachycardic regular rate and rhythm Lungs: Clear to auscultation Abdomen: Soft nontender Extremities: No cyanosis clubbing or edema Skin: No skin lesions noted Neuro: Appears to be grossly intact  Assessment/Plan: 1. Acute kidney injury-urinalysis cloudy no evidence of any hematuria.  I would be concerned about UTI.  Recommend broad-spectrum antibiotics.  No evidence of obstruction on CT scan.  Clinically appears dehydrated with improved renal function with volume resuscitation.  She continues on normal saline at 125 cc an hour.  I believe this is appropriate, could possibly change to D5W with 3 A of bicarb..  Would hold ACE inhibitor lisinopril.  Avoid metformin.  Avoid nonsteroidal anti-inflammatory drugs.  Continue to monitor I's and O's and daily renal panel.  But anticipate full recovery 2. Hypertension/volume  -continue to follow rehydrate for volume depletion. 3. Anemia  -does not appear to be an issue at this time. 4.  Metabolic acidosis.  IV sodium bicarbonate at 125 cc an hour x24 hours.  Hopefully patient will be eating and drinking.   Garnetta BuddyMartin W Mariana Wiederholt 04/27/2021, 9:30 AM

## 2021-04-27 NOTE — Progress Notes (Signed)
Patients BS 62, given orange juice PO and rechecked it is 79 at this time.

## 2021-04-27 NOTE — Progress Notes (Signed)
Patient able to void without difficulty post catheter removal , up to [facility] with one assist

## 2021-04-28 DIAGNOSIS — N179 Acute kidney failure, unspecified: Secondary | ICD-10-CM | POA: Diagnosis not present

## 2021-04-28 LAB — RENAL FUNCTION PANEL
Albumin: 3.2 g/dL — ABNORMAL LOW (ref 3.5–5.0)
Anion gap: 8 (ref 5–15)
BUN: 41 mg/dL — ABNORMAL HIGH (ref 6–20)
CO2: 32 mmol/L (ref 22–32)
Calcium: 8 mg/dL — ABNORMAL LOW (ref 8.9–10.3)
Chloride: 99 mmol/L (ref 98–111)
Creatinine, Ser: 1.23 mg/dL — ABNORMAL HIGH (ref 0.44–1.00)
GFR, Estimated: 51 mL/min — ABNORMAL LOW (ref >60)
Glucose, Bld: 173 mg/dL — ABNORMAL HIGH (ref 70–99)
Phosphorus: 2 mg/dL — ABNORMAL LOW (ref 2.5–4.6)
Potassium: 3.7 mmol/L (ref 3.5–5.1)
Sodium: 139 mmol/L (ref 135–145)

## 2021-04-28 LAB — GLUCOSE, CAPILLARY
Glucose-Capillary: 181 mg/dL — ABNORMAL HIGH (ref 70–99)
Glucose-Capillary: 164 mg/dL — ABNORMAL HIGH (ref 70–99)
Glucose-Capillary: 182 mg/dL — ABNORMAL HIGH (ref 70–99)

## 2021-04-28 LAB — BASIC METABOLIC PANEL
Anion gap: 10 (ref 5–15)
BUN: 27 mg/dL — ABNORMAL HIGH (ref 6–20)
CO2: 33 mmol/L — ABNORMAL HIGH (ref 22–32)
Calcium: 8.3 mg/dL — ABNORMAL LOW (ref 8.9–10.3)
Chloride: 97 mmol/L — ABNORMAL LOW (ref 98–111)
Creatinine, Ser: 0.84 mg/dL (ref 0.44–1.00)
GFR, Estimated: >60 mL/min (ref >60)
Glucose, Bld: 136 mg/dL — ABNORMAL HIGH (ref 70–99)
Potassium: 3.5 mmol/L (ref 3.5–5.1)
Sodium: 140 mmol/L (ref 135–145)

## 2021-04-28 MED ORDER — GABAPENTIN 800 MG PO TABS: 800.0000 mg | ORAL_TABLET | Freq: Two times a day (BID) | ORAL | 3 refills | Status: AC

## 2021-04-28 MED ORDER — SODIUM BICARBONATE 8.4 % IV SOLN
Freq: Once | INTRAVENOUS | Status: AC
  Filled 2021-04-28: qty 1000

## 2021-04-28 MED ORDER — LEVETIRACETAM 500 MG PO TABS: 500.0000 mg | ORAL_TABLET | Freq: Two times a day (BID) | ORAL | 0 refills | Status: AC

## 2021-04-28 MED ORDER — SITAGLIPTIN PHOSPHATE 100 MG PO TABS: 100.0000 mg | ORAL_TABLET | Freq: Every day | ORAL | 2 refills | Status: AC

## 2021-04-28 MED ORDER — CLOPIDOGREL BISULFATE 75 MG PO TABS: 75.0000 mg | ORAL_TABLET | Freq: Every day | ORAL | 11 refills | Status: AC

## 2021-04-28 MED ORDER — METOPROLOL TARTRATE 50 MG PO TABS: 50.0000 mg | ORAL_TABLET | Freq: Two times a day (BID) | ORAL | 3 refills | Status: AC

## 2021-04-28 MED ORDER — ASPIRIN 81 MG PO TBEC: 81.0000 mg | DELAYED_RELEASE_TABLET | Freq: Every day | ORAL | 11 refills | Status: AC

## 2021-04-28 MED ORDER — ACETAMINOPHEN 325 MG PO TABS: 650.0000 mg | ORAL_TABLET | Freq: Four times a day (QID) | ORAL | 2 refills | Status: AC | PRN

## 2021-04-28 MED ORDER — MIRTAZAPINE 15 MG PO TABS: 15.0000 mg | ORAL_TABLET | Freq: Every day | ORAL | 5 refills | Status: AC

## 2021-04-28 MED ORDER — METFORMIN HCL 500 MG PO TABS: 500.0000 mg | ORAL_TABLET | Freq: Two times a day (BID) | ORAL | 5 refills | Status: AC

## 2021-04-28 MED ORDER — ALBUTEROL SULFATE HFA 108 (90 BASE) MCG/ACT IN AERS: 2.0000 | INHALATION_SPRAY | Freq: Four times a day (QID) | RESPIRATORY_TRACT | 1 refills | Status: AC | PRN

## 2021-04-28 MED ORDER — ATORVASTATIN CALCIUM 40 MG PO TABS: 40.0000 mg | ORAL_TABLET | Freq: Every day | ORAL | 3 refills | Status: AC

## 2021-04-28 MED ORDER — LEVETIRACETAM 500 MG PO TABS
500.0000 mg | ORAL_TABLET | Freq: Two times a day (BID) | ORAL | Status: DC
  Administered 2021-04-28: 500 mg via ORAL
  Filled 2021-04-28: qty 1

## 2021-04-28 NOTE — Evaluation (Signed)
Physical Therapy Evaluation Patient Details Name: Judith Sullivan MRN: [id number] DOB: 02-27-1961 Today's Date: 04/28/2021   History of Present Illness  Christianne VERTIS BAUDER is a 60 y.o. female with medical history significant for CVA, seizures, COPD, asthma, hypertension.  Patient was brought to the ED reports of change in mental status, increasing weakness.  History is obtained from patient's spouse who is at bedside, limited history from patient as at the time of my evaluation patient is lethargic and confused.  Patient spouse reports that yesterday he noted patient was confused.  He reports patient told him that she vomited about 3 days ago, patient also had some loose stools about 2 days ago.  Spouse is unsure of severity of the symptoms.  Reports at baseline patient does not eat much, and this is not changed.  Patient takes Advil occasionally, but is not sure of quantity.  He is unaware of any new medications-and will bring her home medications tomorrow.  Reports chronic intermittent dizziness.  Patient has chronic mild right sided weakness, that probably precedes her stroke in 2021, he reports this is unchanged.  Patient also reported some right flank pain.  Patient takes oxycodone 4 times daily for chronic neck and back pain, her last dose was just prior to ED visit.    Clinical Impression  Patient demonstrates slow labored cadence having to lean on nearby objects for support, required use of RW for safety with fair return for use due to occasional bumping into nearby objects without loss of balance, limited mostly due to c/o fatigue.  Patient put back to bed with spouse in room and instructed to ask for assistance when needing out of bed with understanding acknowledged.  Patient will benefit from continued physical therapy in hospital and recommended venue below to increase strength, balance, endurance for safe ADLs and gait.    Follow Up Recommendations Home health PT;Supervision for  mobility/OOB;Supervision - Intermittent    Equipment Recommendations  None recommended by PT    Recommendations for Other Services       Precautions / Restrictions Precautions Precautions: Fall Restrictions Weight Bearing Restrictions: No      Mobility  Bed Mobility Overal bed mobility: Needs Assistance Bed Mobility: Supine to Sit;Sit to Supine     Supine to sit: Supervision Sit to supine: Supervision   General bed mobility comments: increased time, labored movement, required assistance to move covers    Transfers Overall transfer level: Needs assistance Equipment used: 1 person hand held assist;Rolling walker (2 wheeled) Transfers: Sit to/from [employer] Sit to Stand: Min guard;Min assist Stand pivot transfers: Min guard;Min assist       General transfer comment: unsteady on feet requiring hand held assist to maintain standing balance, required use of RW for safety  Ambulation/Gait Ambulation/Gait assistance: Min guard;Min assist Gait Distance (Feet): 45 Feet Assistive device: Rolling walker (2 wheeled) Gait Pattern/deviations: Decreased step length - right;Decreased step length - left;Decreased stride length Gait velocity: decreased   General Gait Details: slow labored unsteady cadence requiring hand held assist without use of AD, required RW for safety demonstrating fair/good return for use with occasional bumping into nearby objects without loss of balance, limited secondary to fatigue  Stairs            Wheelchair Mobility    Modified Rankin (Stroke Patients Only)       Balance Overall balance assessment: Needs assistance Sitting-balance support: Feet supported;No upper extremity supported Sitting balance-Leahy Scale: Good Sitting balance - Comments: seated at  EOB   Standing balance support: During functional activity;No upper extremity supported Standing balance-Leahy Scale: Poor Standing balance comment: fair using RW                              Pertinent Vitals/Pain Pain Assessment: No/denies pain    Home Living Family/patient expects to be discharged to:: Private residence Living Arrangements: Spouse/significant other Available Help at Discharge: Family;Available 24 hours/day Type of Home: House Home Access: Stairs to enter Entrance Stairs-Rails: None Entrance Stairs-Number of Steps: 2 Home Layout: One level Home Equipment: Cane - single point;Walker - 2 wheels;Bedside commode;Shower seat      Prior Function Level of Independence: Independent         Comments: Tourist information centre manager, drives     Hand Dominance   Dominant Hand: Left    Extremity/Trunk Assessment   Upper Extremity Assessment Upper Extremity Assessment: Generalized weakness    Lower Extremity Assessment Lower Extremity Assessment: Generalized weakness    Cervical / Trunk Assessment Cervical / Trunk Assessment: Normal  Communication   Communication: No difficulties  Cognition Arousal/Alertness: Awake/alert Behavior During Therapy: WFL for tasks assessed/performed Overall Cognitive Status: Within Functional Limits for tasks assessed                                        General Comments      Exercises     Assessment/Plan    PT Assessment Patient needs continued PT services  PT Problem List Decreased strength;Decreased balance;Decreased activity tolerance;Decreased mobility       PT Treatment Interventions DME instruction;Gait training;Stair training;Functional mobility training;Therapeutic activities;Therapeutic exercise;Balance training;Patient/family education    PT Goals (Current goals can be found in the Care Plan section)  Acute Rehab PT Goals Patient Stated Goal: return home with family to assist PT Goal Formulation: With patient/family Time For Goal Achievement: 05/01/21 Potential to Achieve Goals: Good    Frequency Min 3X/week   Barriers to discharge         Co-evaluation               AM-PAC PT "6 Clicks" Mobility  Outcome Measure Help needed turning from your back to your side while in a flat bed without using bedrails?: None Help needed moving from lying on your back to sitting on the side of a flat bed without using bedrails?: None Help needed moving to and from a bed to a chair (including a wheelchair)?: A Little Help needed standing up from a chair using your arms (e.g., wheelchair or bedside chair)?: A Little Help needed to walk in hospital room?: A Little Help needed climbing 3-5 steps with a railing? : A Lot 6 Click Score: 19    End of Session   Activity Tolerance: Patient tolerated treatment well;Patient limited by fatigue Patient left: in bed;with call bell/phone within reach;with family/visitor present Nurse Communication: Mobility status PT Visit Diagnosis: Unsteadiness on feet (R26.81);Other abnormalities of gait and mobility (R26.89);Muscle weakness (generalized) (M62.81)    Time: 6720-9470 PT Time Calculation (min) (ACUTE ONLY): 20 min   Charges:   PT Evaluation $PT Eval Moderate Complexity: 1 Mod PT Treatments $Therapeutic Activity: 8-22 mins        11:57 AM, 04/28/21 Ocie Bob, MPT Physical Therapist with [facility] [phone] office [phone] mobile phone

## 2021-04-28 NOTE — Progress Notes (Signed)
Admit: 04/26/2021 LOS: 2  36F with severe AKI, prerenal+ACEi; resolving  Subjective:  Marland Kitchen SCr further improved to 1.2, 1.5L UOP . Possibly for DC later today  . K 3.7 . Still some issues with PO intake . Husband at bedside    05/19 0701 - 05/20 0700 In: 1605.5 [P.O.:960; I.V.:445.5; IV Piggyback:200] Out: 1450 [Urine:1450]  Filed Weights   04/26/21 1244 04/26/21 2048  Weight: 54.4 kg 53.3 kg    Scheduled Meds: . aspirin EC  81 mg Oral Q breakfast  . atorvastatin  40 mg Oral Daily  . Chlorhexidine Gluconate Cloth  6 each Topical Daily  . clopidogrel  75 mg Oral Daily  . insulin aspart  0-5 Units Subcutaneous QHS  . insulin aspart  0-6 Units Subcutaneous TID WC  . levETIRAcetam  500 mg Oral BID  . metoprolol tartrate  25 mg Oral BID  . mirtazapine  15 mg Oral QHS   Continuous Infusions: . cefTRIAXone (ROCEPHIN)  IV 1 g (04/27/21 1420)   PRN Meds:.acetaminophen **OR** acetaminophen, albuterol, hydrALAZINE, ondansetron **OR** ondansetron (ZOFRAN) IV  Current Labs: reviewed    Physical Exam:  Blood pressure 140/79, pulse 98, temperature 100.3 F (37.9 C), temperature source Oral, resp. rate 20, height 5\' 1"  (1.549 m), weight 53.3 kg, SpO2 91 %. NAD RRR CTAB No sig LEE S/nt/nd NCAT EOMI  A 1. AKI, severe, pre-renal and rapidly recovering; no baseline CKD 2. N/V/Diarrhea, improving 3. HTN on ACEi as outpatient 4. DM2  P . Should be okay for discharge as long as is tolerating p.o. and can keep hydrated . I would not resume ACE inhibitor at this time . No need to follow-up in our office, PCP can refer as needed . Will sign off for now.  Please call with any questions or concerns.    MD 04/28/2021, 12:17 PM  Recent Labs  Lab 04/26/21 1304 04/27/21 0703 04/28/21 0458  NA 127* 134* 139  K 5.3* 4.9 3.7  CL 90* 104 99  CO2 18* 17* 32  GLUCOSE 117* 57* 173*  BUN 97* 85* 41*  CREATININE 9.37* 5.96* 1.23*  CALCIUM 8.5* 7.8* 8.0*  PHOS  --   --   2.0*   Recent Labs  Lab 04/26/21 1304 04/27/21 0703  WBC 11.0* 9.0  NEUTROABS 9.1*  --   HGB 13.8 13.3  HCT 40.4 39.6  MCV 96.2 98.8  PLT 192 148*

## 2021-04-28 NOTE — Plan of Care (Signed)
  Problem: Acute Rehab PT Goals(only PT should resolve) Goal: Pt Will Go Supine/Side To Sit Outcome: Progressing Flowsheets (Taken 04/28/2021 1158) Pt will go Supine/Side to Sit: Independently Goal: Patient Will Transfer Sit To/From Stand Outcome: Progressing Flowsheets (Taken 04/28/2021 1158) Patient will transfer sit to/from stand: with supervision Goal: Pt Will Transfer Bed To Chair/Chair To Bed Outcome: Progressing Flowsheets (Taken 04/28/2021 1158) Pt will Transfer Bed to Chair/Chair to Bed: with supervision Goal: Pt Will Ambulate Outcome: Progressing Flowsheets (Taken 04/28/2021 1158) Pt will Ambulate:  100 feet  with supervision  with rolling walker   11:59 AM, 04/28/21 Ocie Bob, MPT Physical Therapist with [facility] 336 [phone] office [phone] mobile phone

## 2021-04-28 NOTE — Discharge Summary (Signed)
Judith Sullivan, is a 60 y.o. female  DOB [birth year]  MRN [id number].  Admission date:  04/26/2021  Admitting Physician  Onnie Boer, MD  Discharge Date:  04/28/2021   Primary MD  Ignatius Specking, MD  Recommendations for primary care physician for things to follow:   1)Avoid ibuprofen/Advil/Aleve/Motrin/Goody Powders/Naproxen/BC powders/Meloxicam/Diclofenac/Indomethacin and other Nonsteroidal anti-inflammatory medications as these will make you more likely to bleed and can cause stomach ulcers, can also cause Kidney problems.   2) Stop lisinopril  3) please repeat CBC and BMP blood test with your  primary care physician Dr. Ignatius Specking, MD  around Tuesday 05/02/2021  4) please continue to drink plenty fluids-throughout the day  5)Please take mirtazapine/Remeron for appetite stimulation, for mood and for sleep   Admission Diagnosis  Flank pain [R10.9] AKI (acute kidney injury) (HCC) [N17.9] Altered mental status, unspecified altered mental status type [R41.82]   Discharge Diagnosis  Flank pain [R10.9] AKI (acute kidney injury) (HCC) [N17.9] Altered mental status, unspecified altered mental status type [R41.82]    Principal Problem:   AKI (acute kidney injury) (HCC) Active Problems:   Acute metabolic encephalopathy   Cerebrovascular accident (CVA) (HCC)   Seizures (HCC)      Past Medical History:  Diagnosis Date  . Asthma with bronchitis   . Backache   . COPD (chronic obstructive pulmonary disease) (HCC)   . CTS (carpal tunnel syndrome)   . Hypertension   . Kidney stones   . Stroke [facility])     Past Surgical History:  Procedure Laterality Date  . BUBBLE STUDY  10/12/2020   Procedure: BUBBLE STUDY;  Surgeon: Jake Bathe, MD;  Location: [facility] ENDOSCOPY;  Service: Cardiovascular;;  . CESAREAN SECTION    . CHOLECYSTECTOMY    . kidney stone removal    . LOOP RECORDER INSERTION N/A  10/12/2020   Procedure: LOOP RECORDER INSERTION;  Surgeon: Regan Lemming, MD;  Location: [facility] INVASIVE CV LAB;  Service: Cardiovascular;  Laterality: N/A;  . neck fusion    . SHOULDER SURGERY    . TEE WITHOUT CARDIOVERSION N/A 10/12/2020   Procedure: TRANSESOPHAGEAL ECHOCARDIOGRAM (TEE);  Surgeon: Jake Bathe, MD;  Location: [facility] ENDOSCOPY;  Service: Cardiovascular;  Laterality: N/A;     HPI  from the history and physical done on the day of admission:   Chief Complaint: AMS  HPI: Judith Sullivan is a 60 y.o. female with medical history significant for CVA, seizures, COPD, asthma, hypertension. Patient was brought to the ED reports of change in mental status, increasing weakness.  History is obtained from patient's spouse who is at bedside, limited history from patient as at the time of my evaluation patient is lethargic and confused. Patient spouse reports that yesterday he noted patient was confused.  He reports patient told him that she vomited about 3 days ago, patient also had some loose stools about 2 days ago.  Spouse is unsure of severity of the symptoms.  Reports at baseline patient does not eat much, and  this is not changed.  Patient takes Advil occasionally, but is not sure of quantity.  He is unaware of any new medications-and will bring her home medications tomorrow.  Reports chronic intermittent dizziness.  Patient has chronic mild right sided weakness, that probably precedes her stroke in 2021, he reports this is unchanged. Patient also reported some right flank pain.  Patient takes oxycodone 4 times daily for chronic neck and back pain, her last dose was just prior to ED visit.  ED Course: Temperature 98.8.  Heart rate 70s to 113.  Torrey rate 18-22.  Blood pressure systolic initially 150s, dropped to the 80s in the ED, improved with 1 L bolus.  O2 sats greater than 90% on room air.  Creatinine markedly elevated at 9.37, sodium 127.  Potassium 5.3.  Anion gap of 19. 1 L bolus  given hospitalist to admit.      Hospital Course:      Brief Summary:-  60 y.o.femalewith medical history significant for priorCVA, seizures, COPD/asthma, HTN, DM and depressive disorder admitted on 04/26/2021 with acute metabolic encephalopathy in the setting of significant dehydration with AKI and electrolyte abnormalities  A/p 1)acute kidney injury on CKD stage -due to dehydration as a  result of the combination of nausea vomiting and diarrhea, poor oral intake compounded by lisinopril use -  creatinine on admission=  9.37, baseline creatinine = 0.6 to 0.8   ,  creatinine is now= 0.84 (with hydration),  --renally adjust medications, avoid nephrotoxic agents / dehydration  / hypotension -Stop lisinopril given soft BP -AKI metabolic acidosis resolved with IV fluids, increase oral intake on bicarb drip -Nephrology input appreciated  2)Hyperkalemia/hyponatremia and hypochloremia--- hypokalemia and hypochloremia resolved with hydration and improvement in renal function -Hyponatremia and hyperkalemia resolved with improvement in renal function as above #1  3) possible UTI--urine may be contaminated, treated with Rocephin -No further antibiotics required-urine cultures negative  4)DM2-last A1c was 6.5% reflecting excellent diabetic control PTA Okay to restart Januvia and metformin,   5)H/o Prior CVA-okay to continue Lipitor, aspirin and Plavix for secondary stroke prophylaxis  6)Seizure DO--- continue Keppra  7)Acute Metabolic Encephalopathy--- resolved, it was most likely due to #1 above  Disposition--Home with husband and home health    Disposition: The patient is from: Home  Anticipated d/c is to: Home    Code Status : = -  Code Status: Full Code   Family Communication:   (patient is alert, awake and coherent)  Discussed with husband and daughter at bedside  Consults  : Nephrology  Discharge Condition: stable  Follow UP    Follow-up Information    Doreen BeamVyas, Dhruv B, MD. Schedule an appointment as soon as possible for a visit on 05/02/2021.   Specialty: Internal Medicine Why: Repeat CBC and BMP blood test Contact information: [address] [place] [address] [phone]                Consults obtained -   Diet and Activity recommendation:  As advised  Discharge Instructions    * Discharge Instructions    Call MD for:  difficulty breathing, headache or visual disturbances   Complete by: As directed    Call MD for:  persistant dizziness or light-headedness   Complete by: As directed    Call MD for:  persistant nausea and vomiting   Complete by: As directed    Call MD for:  temperature >100.4   Complete by: As directed    Diet - low sodium heart healthy  Complete by: As directed    Diet Carb Modified   Complete by: As directed    Discharge instructions   Complete by: As directed    1)Avoid ibuprofen/Advil/Aleve/Motrin/Goody Powders/Naproxen/BC powders/Meloxicam/Diclofenac/Indomethacin and other Nonsteroidal anti-inflammatory medications as these will make you more likely to bleed and can cause stomach ulcers, can also cause Kidney problems.   2) Stop lisinopril  3) please repeat CBC and BMP blood test with your  primary care physician Dr. Doreen Beam B, MD  around Tuesday 05/02/2021  4) please continue to drink plenty fluids-throughout the day  5)Please take mirtazapine/Remeron for appetite stimulation, for mood and for sleep   Increase activity slowly   Complete by: As directed         Discharge Medications     Allergies as of 04/28/2021   No Known Allergies     Medication List    STOP taking these medications   amitriptyline 25 MG tablet Commonly known as: ELAVIL   amoxicillin-clavulanate 500-125 MG tablet Commonly known as: AUGMENTIN   lisinopril 20 MG tablet Commonly known as: ZESTRIL   oxyCODONE 15 MG immediate release tablet Commonly known as: ROXICODONE     TAKE  these medications   acetaminophen 325 MG tablet Commonly known as: TYLENOL Take 2 tablets (650 mg total) by mouth every 6 (six) hours as needed for mild pain (or Fever >/= 101).   albuterol 108 (90 Base) MCG/ACT inhaler Commonly known as: VENTOLIN HFA Inhale 2 puffs into the lungs every 6 (six) hours as needed for wheezing or shortness of breath (cough). What changed: reasons to take this   aspirin 81 MG EC tablet Take 1 tablet (81 mg total) by mouth daily with breakfast. Swallow whole. Start taking on: Apr 29, 2021 What changed:   medication strength  how much to take  when to take this  additional instructions   atorvastatin 40 MG tablet Commonly known as: LIPITOR Take 1 tablet (40 mg total) by mouth daily.   Cholecalciferol 25 MCG (1000 UT) tablet Take 1,000 Units by mouth daily.   clopidogrel 75 MG tablet Commonly known as: PLAVIX Take 1 tablet (75 mg total) by mouth daily.   cyanocobalamin 100 MCG tablet Take 100 mcg by mouth daily.   gabapentin 800 MG tablet Commonly known as: NEURONTIN Take 1 tablet (800 mg total) by mouth 2 (two) times daily. What changed: when to take this   levETIRAcetam 500 MG tablet Commonly known as: KEPPRA Take 1 tablet (500 mg total) by mouth 2 (two) times daily.   metFORMIN 500 MG tablet Commonly known as: GLUCOPHAGE Take 1 tablet (500 mg total) by mouth 2 (two) times daily with a meal. What changed:   how much to take  additional instructions   metoprolol tartrate 50 MG tablet Commonly known as: LOPRESSOR Take 1 tablet (50 mg total) by mouth 2 (two) times daily. What changed: Another medication with the same name was removed. Continue taking this medication, and follow the directions you see here.   mirtazapine 15 MG tablet Commonly known as: REMERON Take 1 tablet (15 mg total) by mouth at bedtime. For appetite stimulation, for mood and for sleep What changed: additional instructions   sitaGLIPtin 100 MG  tablet Commonly known as: JANUVIA Take 1 tablet (100 mg total) by mouth daily.   tiZANidine 4 MG tablet Commonly known as: ZANAFLEX Take 4 mg by mouth every 8 (eight) hours as needed.   valACYclovir 1000 MG tablet Commonly known as: VALTREX Take 1,000 mg  by mouth daily.       Major procedures and Radiology Reports - PLEASE review detailed and final reports for all details, in brief -   CT Head Wo Contrast  Result Date: 04/26/2021 CLINICAL DATA:  Stroke, follow-up; dizziness, nonspecific. Additional provided: Right-sided weakness for 1 day, history of stroke and hypertension. EXAM: CT HEAD WITHOUT CONTRAST TECHNIQUE: Contiguous axial images were obtained from the base of the skull through the vertex without intravenous contrast. COMPARISON:  Prior head CT examinations 10/06/2020 and earlier. Brain MRI 10/05/2020. FINDINGS: Brain: Cerebral volume is normal. Redemonstrated chronic lacunar infarcts within the left corona radiata/basal ganglia. Known chronic lacunar infarcts within the bilateral thalami were better appreciated on the brain MRI of 10/05/2020. There is no acute intracranial hemorrhage. No acute demarcated cortical infarct. No extra-axial fluid collection. No evidence of intracranial mass. No midline shift. Vascular: No hyperdense vessel.  Atherosclerotic calcifications. Skull: Normal. Negative for fracture or focal lesion. Sinuses/Orbits: Visualized orbits show no acute finding. Tiny right frontoethmoidal sinus osteoma. Otherwise, no significant paranasal sinus disease at the imaged levels. IMPRESSION: No evidence of acute intracranial abnormality. Known chronic lacunar infarcts within the left corona radiata/basal ganglia and bilateral thalami. Electronically Signed   By: Jackey Loge DO   On: 04/26/2021 14:10   DG CHEST PORT 1 VIEW  Result Date: 04/26/2021 CLINICAL DATA:  Respiratory failure and hypoxia EXAM: PORTABLE CHEST 1 VIEW COMPARISON:  10/08/2020 FINDINGS: Cardiac shadow is  within normal limits. Loop recorder is seen. Postsurgical changes in the cervical spine are noted. Lungs are clear bilaterally. No bony abnormality is seen. IMPRESSION: No active disease. Electronically Signed   By: Alcide Clever M.D.   On: 04/26/2021 21:51   CT Renal Stone Study  Result Date: 04/26/2021 CLINICAL DATA:  Right flank pain for 1 day. EXAM: CT ABDOMEN AND PELVIS WITHOUT CONTRAST TECHNIQUE: Multidetector CT imaging of the abdomen and pelvis was performed following the standard protocol without IV contrast. COMPARISON:  05/29/2017 FINDINGS: Lower chest: Dependent atelectasis bilaterally. Heart size normal. No pericardial or pleural effusion. Distal esophageal wall thickening can be seen with gastroesophageal reflux. Hepatobiliary: Liver is unremarkable. Cholecystectomy. Biliary ductal dilatation is likely related. Pancreas: Negative. Spleen: Negative. Adrenals/Urinary Tract: Adrenal glands and kidneys are unremarkable. No urinary stones. Ureters are decompressed. Bladder is relatively low in volume. Stomach/Bowel: Stomach, small bowel and colon are unremarkable. Appendix is not readily visualized. Vascular/Lymphatic: Atherosclerotic calcification of the aorta. There appears to be an accessory right renal artery, originating from the right common iliac artery. No pathologically enlarged lymph nodes. Reproductive: Uterus is visualized. Heterogeneous lesion in the uterine fundus is 2.4 x 2.9 cm in size, similar to 05/29/2017, indicative of a fibroid. No adnexal mass. Other: No free fluid.  Mesenteries and peritoneum are unremarkable. Musculoskeletal: Degenerative changes in the spine. No worrisome lytic or sclerotic lesions. IMPRESSION: 1. No findings to explain the patient's right flank pain. 2. Aortic atherosclerosis (ICD10-I70.0). Electronically Signed   By: Leanna Battles M.D.   On: 04/26/2021 15:33    Micro Results     Recent Results (from the past 240 hour(s))  Resp Panel by RT-PCR (Flu  A&B, Covid) Nasopharyngeal Swab     Status: None   Collection Time: 04/26/21  4:07 PM   Specimen: Nasopharyngeal Swab; Nasopharyngeal(NP) swabs in vial transport medium  Result Value Ref Range Status   SARS Coronavirus 2 by RT PCR NEGATIVE NEGATIVE Final    Comment: (NOTE) SARS-CoV-2 target nucleic acids are NOT DETECTED.  The SARS-CoV-2 RNA  is generally detectable in upper respiratory specimens during the acute phase of infection. The lowest concentration of SARS-CoV-2 viral copies this assay can detect is 138 copies/mL. A negative result does not preclude SARS-Cov-2 infection and should not be used as the sole basis for treatment or other patient management decisions. A negative result may occur with  improper specimen collection/handling, submission of specimen other than nasopharyngeal swab, presence of viral mutation(s) within the areas targeted by this assay, and inadequate number of viral copies(<138 copies/mL). A negative result must be combined with clinical observations, patient history, and epidemiological information. The expected result is Negative.  Fact Sheet for Patients:  BloggerCourse.com  Fact Sheet for Healthcare Providers:  SeriousBroker.it  This test is no t yet approved or cleared by the [place] FDA and  has been authorized for detection and/or diagnosis of SARS-CoV-2 by FDA under an Emergency Use Authorization (EUA). This EUA will remain  in effect (meaning this test can be used) for the duration of the COVID-19 declaration under Section 564(b)(1) of the Act, 21 U.S.C.section 360bbb-3(b)(1), unless the authorization is terminated  or revoked sooner.       Influenza A by PCR NEGATIVE NEGATIVE Final   Influenza B by PCR NEGATIVE NEGATIVE Final    Comment: (NOTE) The Xpert Xpress SARS-CoV-2/FLU/RSV plus assay is intended as an aid in the diagnosis of influenza from Nasopharyngeal swab specimens  and should not be used as a sole basis for treatment. Nasal washings and aspirates are unacceptable for Xpert Xpress SARS-CoV-2/FLU/RSV testing.  Fact Sheet for Patients: BloggerCourse.com  Fact Sheet for Healthcare Providers: SeriousBroker.it  This test is not yet approved or cleared by the [place] FDA and has been authorized for detection and/or diagnosis of SARS-CoV-2 by FDA under an Emergency Use Authorization (EUA). This EUA will remain in effect (meaning this test can be used) for the duration of the COVID-19 declaration under Section 564(b)(1) of the Act, 21 U.S.C. section 360bbb-3(b)(1), unless the authorization is terminated or revoked.  Performed at [facility], [address] [place], [place] [address]   MRSA PCR Screening     Status: Abnormal   Collection Time: 04/27/21 12:59 AM   Specimen: Nasopharyngeal  Result Value Ref Range Status   MRSA by PCR POSITIVE (A) NEGATIVE Final    Comment:        The GeneXpert MRSA Assay (FDA approved for NASAL specimens only), is one component of a comprehensive MRSA colonization surveillance program. It is not intended to diagnose MRSA infection nor to guide or monitor treatment for MRSA infections. RESULT CALLED TO, READ BACK BY AND VERIFIED WITH: L COLEMAN 5/19 @ 0949 BY S BEARD Performed at [facility], [address], [place], [place] [address]     Today   Subjective    Deon Ivey today has no new complaints, eating and drinking well, no nausea no vomiting, no diarrhea -Voiding well since removal of Foley          Patient has been seen and examined prior to discharge   Objective   Blood pressure (!) 171/87, pulse 99, temperature 97.9 F (36.6 C), temperature source Oral, resp. rate 18, height 5\' 1"  (1.549 m), weight 53.3 kg, SpO2 93 %.   Intake/Output Summary (Last 24 hours) at 04/28/2021 1654 Last data filed at 04/28/2021 1500 Gross per 24 hour   Intake 800.58 ml  Output 800 ml  Net 0.58 ml    Exam Gen:- Awake Alert, no acute distress  HEENT:- [place].AT, No  sclera icterus Neck-Supple Neck,No JVD,.  Lungs-  CTAB , good air movement bilaterally  CV- S1, S2 normal, regular Abd-  +ve B.Sounds, Abd Soft, No tenderness,    Extremity/Skin:- No  edema,   good pulses Psych-affect is appropriate, oriented x3 Neuro-generalized weakness, no new focal deficits, no tremors    Data Review   CBC w Diff:  Lab Results  Component Value Date   WBC 9.0 04/27/2021   HGB 13.3 04/27/2021   HCT 39.6 04/27/2021   PLT 148 (L) 04/27/2021   LYMPHOPCT 11 04/26/2021   MONOPCT 5 04/26/2021   EOSPCT 0 04/26/2021   BASOPCT 0 04/26/2021    CMP:  Lab Results  Component Value Date   NA 140 04/28/2021   K 3.5 04/28/2021   CL 97 (L) 04/28/2021   CO2 33 (H) 04/28/2021   BUN 27 (H) 04/28/2021   CREATININE 0.84 04/28/2021   PROT 7.0 04/26/2021   ALBUMIN 3.2 (L) 04/28/2021   ALBUMIN 4.2 12/28/2020   BILITOT 0.5 04/26/2021   ALKPHOS 84 04/26/2021   AST 34 04/26/2021   ALT 25 04/26/2021  .   Total Discharge time is about 33 minutes  Shon Hale M.D on 04/28/2021 at 4:54 PM  Go to [email or web address] -  for contact info  [facility] - Office  [phone]

## 2021-04-28 NOTE — Progress Notes (Signed)
IV and tele removed, discharge instructions provided by previous nurse. Packet given to husband. Patient discharged via wheelchair in stable condition.

## 2021-04-28 NOTE — Discharge Instructions (Signed)
1)Avoid ibuprofen/Advil/Aleve/Motrin/Goody Powders/Naproxen/BC powders/Meloxicam/Diclofenac/Indomethacin and other Nonsteroidal anti-inflammatory medications as these will make you more likely to bleed and can cause stomach ulcers, can also cause Kidney problems.   2) Stop lisinopril  3) please repeat CBC and BMP blood test with your  primary care physician Dr. Ignatius Specking, MD  around Tuesday 05/02/2021  4) please continue to drink plenty fluids-throughout the day  5)Please take mirtazapine/Remeron for appetite stimulation, for mood and for sleep

## 2021-04-29 LAB — URINE CULTURE
Culture: NO GROWTH
Special Requests: NORMAL

## 2021-05-01 ENCOUNTER — Ambulatory Visit (INDEPENDENT_AMBULATORY_CARE_PROVIDER_SITE_OTHER): Payer: 59

## 2021-05-01 DIAGNOSIS — I6340 Cerebral infarction due to embolism of unspecified cerebral artery: Secondary | ICD-10-CM

## 2021-05-03 LAB — CUP PACEART REMOTE DEVICE CHECK
Date Time Interrogation Session: 20220521041900
Implantable Pulse Generator Implant Date: 20211103

## 2021-05-23 NOTE — Progress Notes (Signed)
Carelink Summary Report / Loop Recorder 

## 2021-06-01 LAB — CUP PACEART REMOTE DEVICE CHECK
Date Time Interrogation Session: 20220623042149
Implantable Pulse Generator Implant Date: 20211103

## 2021-06-05 ENCOUNTER — Ambulatory Visit (INDEPENDENT_AMBULATORY_CARE_PROVIDER_SITE_OTHER): Payer: 59

## 2021-06-05 DIAGNOSIS — I6340 Cerebral infarction due to embolism of unspecified cerebral artery: Secondary | ICD-10-CM

## 2021-06-23 NOTE — Progress Notes (Signed)
Carelink Summary Report / Loop Recorder 

## 2021-07-06 ENCOUNTER — Ambulatory Visit (INDEPENDENT_AMBULATORY_CARE_PROVIDER_SITE_OTHER): Payer: 59

## 2021-07-06 DIAGNOSIS — I6340 Cerebral infarction due to embolism of unspecified cerebral artery: Secondary | ICD-10-CM

## 2021-07-07 LAB — CUP PACEART REMOTE DEVICE CHECK
Date Time Interrogation Session: 20220726041834
Implantable Pulse Generator Implant Date: 20211103

## 2021-08-02 NOTE — Progress Notes (Signed)
Carelink Summary Report / Loop Recorder 

## 2021-08-08 ENCOUNTER — Ambulatory Visit (INDEPENDENT_AMBULATORY_CARE_PROVIDER_SITE_OTHER): Payer: 59

## 2021-08-08 DIAGNOSIS — I6340 Cerebral infarction due to embolism of unspecified cerebral artery: Secondary | ICD-10-CM

## 2021-08-08 LAB — CUP PACEART REMOTE DEVICE CHECK
Date Time Interrogation Session: 20220828042015
Implantable Pulse Generator Implant Date: 20211103

## 2021-08-22 NOTE — Progress Notes (Signed)
Carelink Summary Report / Loop Recorder 

## 2021-09-11 ENCOUNTER — Ambulatory Visit (INDEPENDENT_AMBULATORY_CARE_PROVIDER_SITE_OTHER): Payer: 59

## 2021-09-11 DIAGNOSIS — I6340 Cerebral infarction due to embolism of unspecified cerebral artery: Secondary | ICD-10-CM | POA: Diagnosis not present

## 2021-09-13 LAB — CUP PACEART REMOTE DEVICE CHECK
Date Time Interrogation Session: 20220930042049
Implantable Pulse Generator Implant Date: 20211103

## 2021-09-19 NOTE — Progress Notes (Signed)
Carelink Summary Report / Loop Recorder 

## 2021-09-20 IMAGING — XA DG SPINAL PUNCT LUMBAR DIAG WITH FL CT GUIDANCE
1 series · 1 of 1 positions shown · non-contrast
Comparison: none

CLINICAL DATA: Polyradiculoneuropathy

[Series 1: ortho adipose · 1 of 1 slices shown]
[im 1/1]
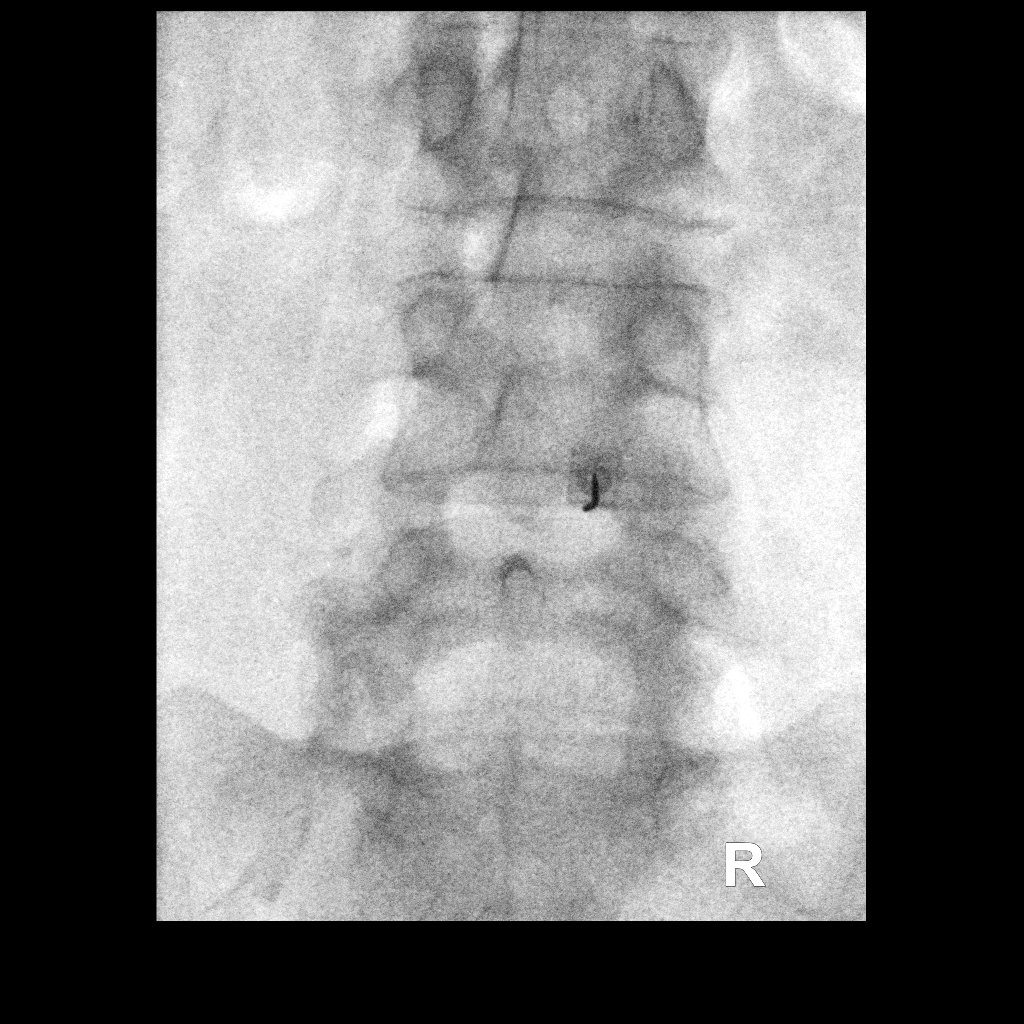

[1 of 1 positions shown; findings below may reference images not displayed]

EXAM:
DIAGNOSTIC LUMBAR PUNCTURE UNDER FLUOROSCOPIC GUIDANCE

FLUOROSCOPY TIME:  8 seconds; 8 uPym9 DAP

PROCEDURE:
Informed consent was obtained from the patient prior to the
procedure, including potential complications of headache, allergy,
and pain. With the patient prone, the lower back was prepped with
Betadine. 1% Lidocaine was used for local anesthesia. Lumbar
puncture was performed at the L4-5 level from a right parasagittal
approach using a 20 gauge needle with return of clear colorless CSF
with an opening pressure of 12 cm water. 7 ml of CSF were obtained
for laboratory studies. The patient tolerated the procedure well and
there were no apparent complications.
IMPRESSION: Technically successful lumbar puncture under fluoroscopy

## 2022-01-17 IMAGING — CT CT RENAL STONE PROTOCOL
2 of 4 series · 16 of 46 positions shown, 18 images · non-contrast
Comparison: 05/29/2017

CLINICAL DATA: Right flank pain for 1 day.

EXAM:
CT ABDOMEN AND PELVIS WITHOUT CONTRAST
TECHNIQUE: Multidetector CT imaging of the abdomen and pelvis was performed
following the standard protocol without IV contrast.

[Series 2: axial st · axial · 0.83mm/px · z∈[+719,+1094]mm · 13 of 85 slices shown, 15 images]
[im 5/85  soft-tissue]
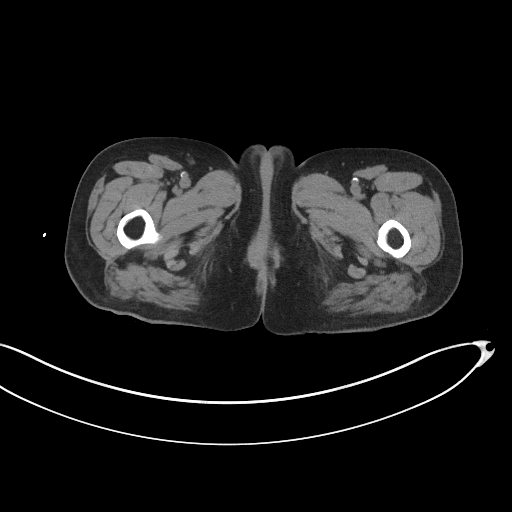
[im 5/85  bone]
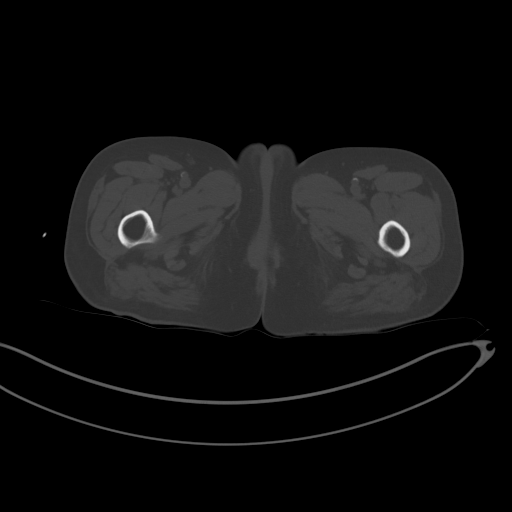
[im 14/85  soft-tissue]
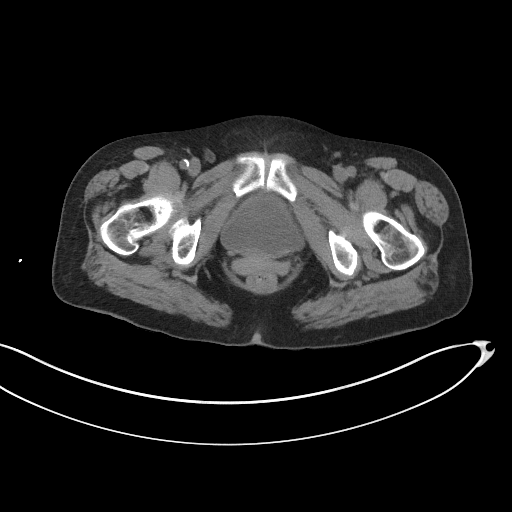
[im 18/85  soft-tissue]
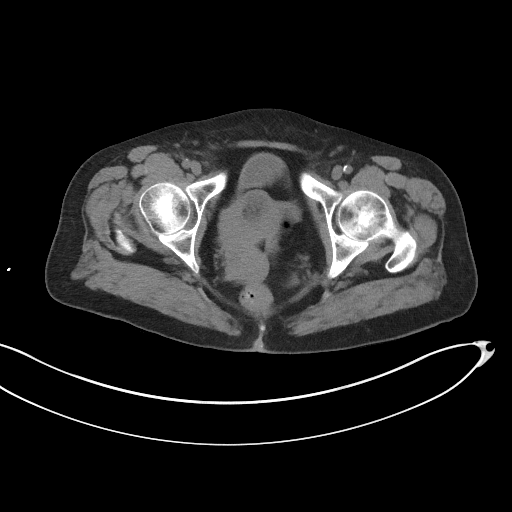
[im 23/85  soft-tissue]
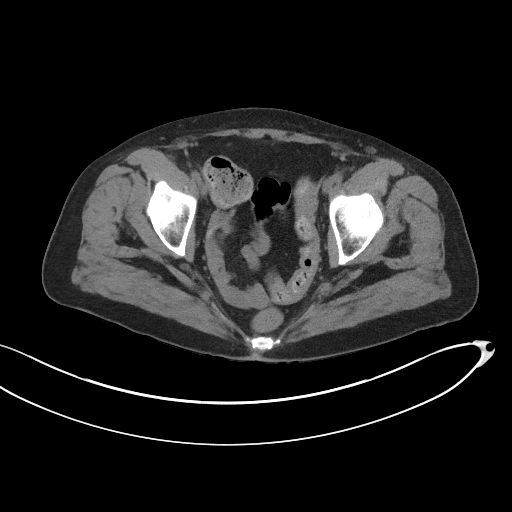
[im 31/85  soft-tissue]
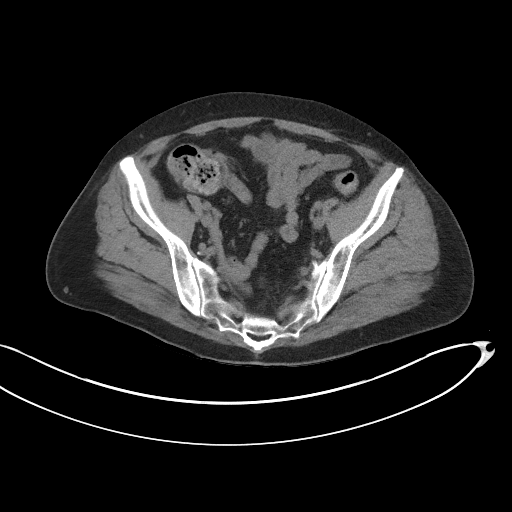
[im 36/85  soft-tissue]
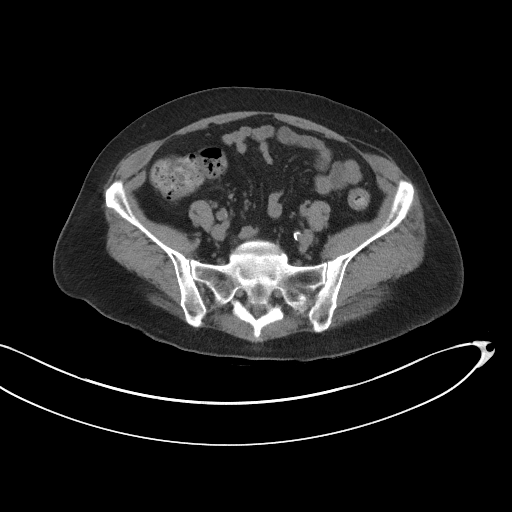
[im 45/85  soft-tissue]
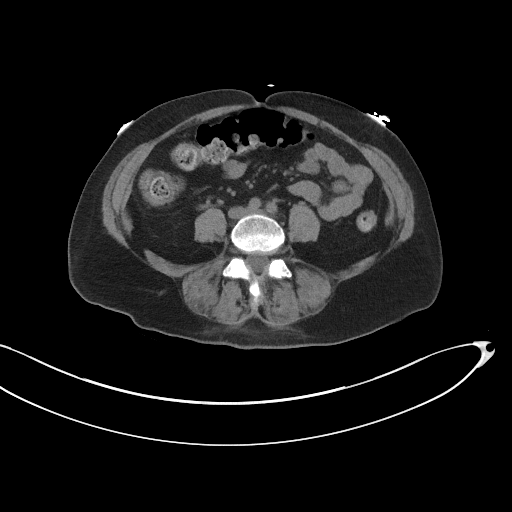
[im 49/85  soft-tissue]
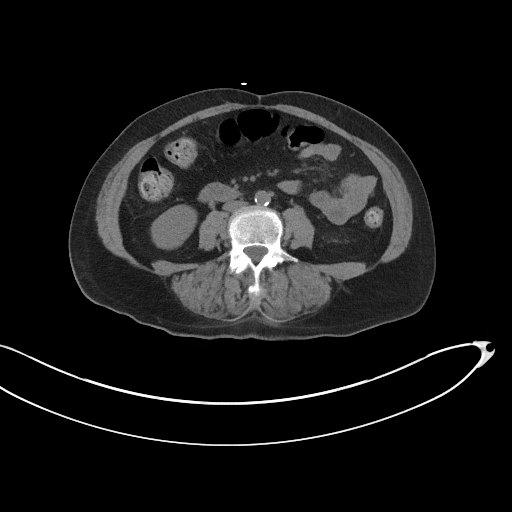
[im 54/85  soft-tissue]
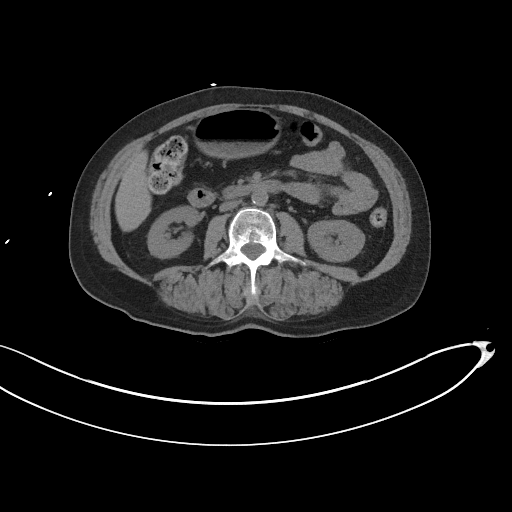
[im 54/85  bone]
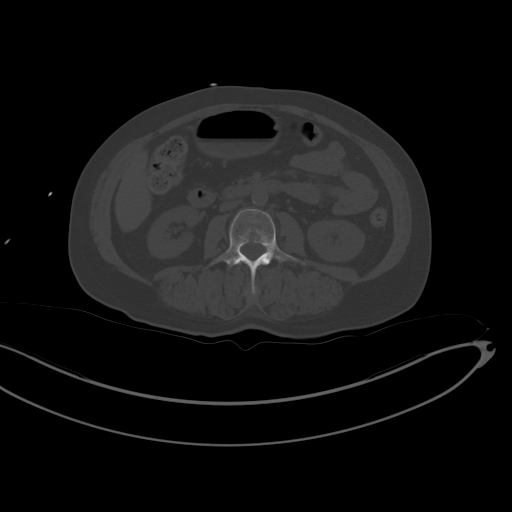
[im 62/85  soft-tissue]
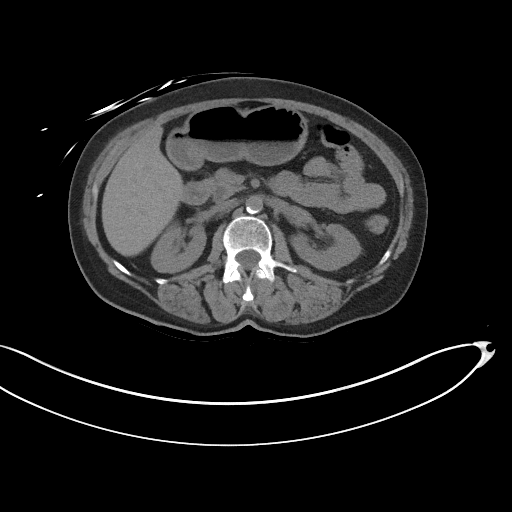
[im 67/85  soft-tissue]
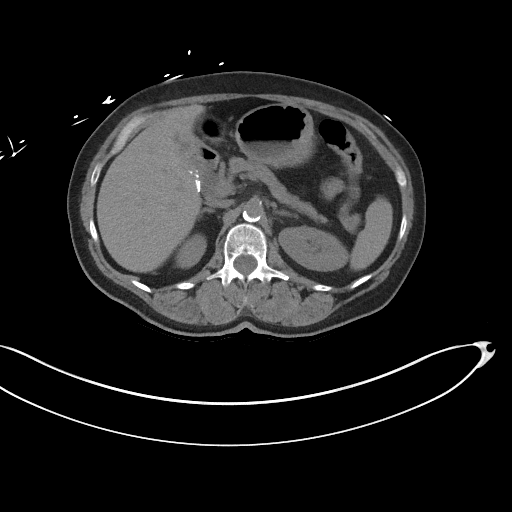
[im 71/85  soft-tissue]
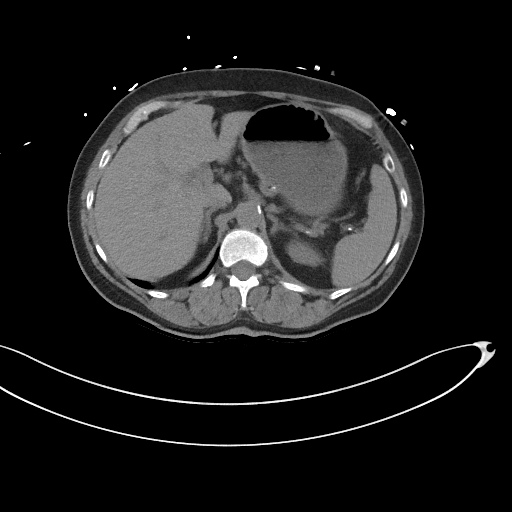
[im 80/85  soft-tissue]
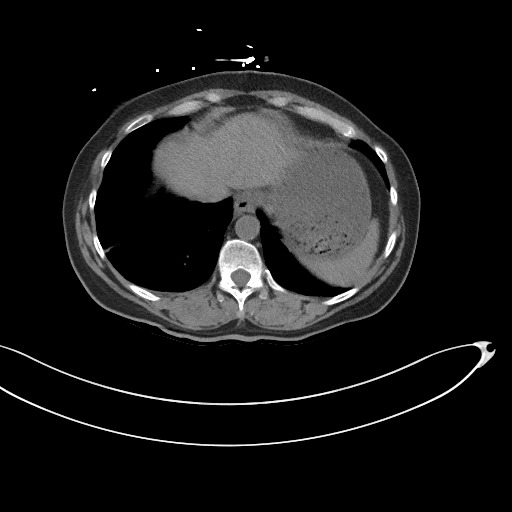

[Series 5: coronal st · coronal · 0.71mm/px · 3 of 93 slices shown]
[im 31/93  soft-tissue]
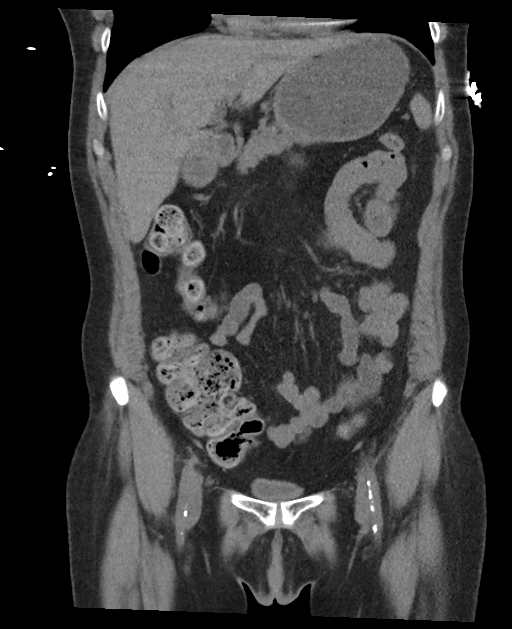
[im 41/93  soft-tissue]
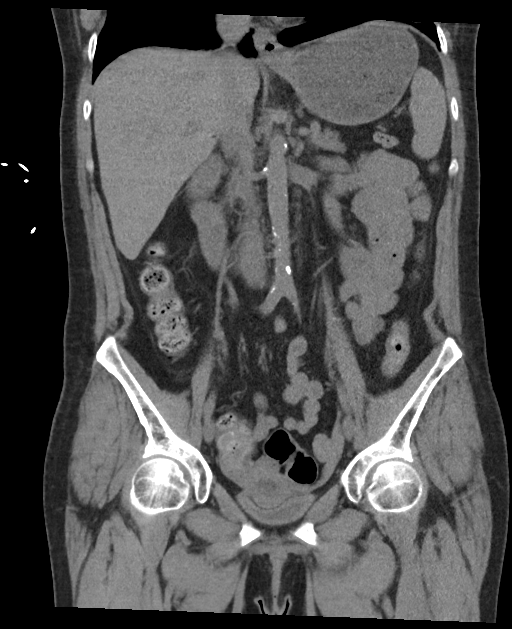
[im 52/93  soft-tissue]
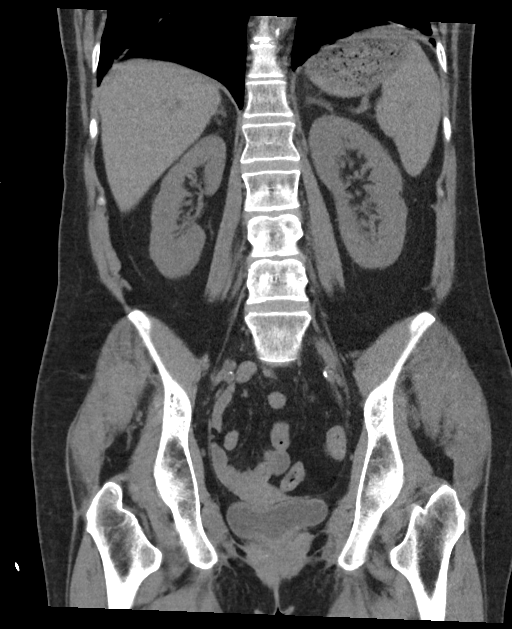

[16 of 46 positions shown; findings below may reference images not displayed]

FINDINGS: Lower chest: Dependent atelectasis bilaterally. Heart size normal.
No pericardial or pleural effusion. Distal esophageal wall
thickening can be seen with gastroesophageal reflux.

Hepatobiliary: Liver is unremarkable. Cholecystectomy. Biliary
ductal dilatation is likely related.

Pancreas: Negative.

Spleen: Negative.

Adrenals/Urinary Tract: Adrenal glands and kidneys are unremarkable.
No urinary stones. Ureters are decompressed. Bladder is relatively
low in volume.

Stomach/Bowel: Stomach, small bowel and colon are unremarkable.
Appendix is not readily visualized.

Vascular/Lymphatic: Atherosclerotic calcification of the aorta.
There appears to be an accessory right renal artery, originating
from the right common iliac artery. No pathologically enlarged lymph
nodes.

Reproductive: Uterus is visualized. Heterogeneous lesion in the
uterine fundus is 2.4 x 2.9 cm in size, similar to 05/29/2017,
indicative of a fibroid. No adnexal mass.

Other: No free fluid.  Mesenteries and peritoneum are unremarkable.

Musculoskeletal: Degenerative changes in the spine. No worrisome
lytic or sclerotic lesions.
IMPRESSION: 1. No findings to explain the patient's right flank pain.
2. Aortic atherosclerosis ([id number]).

## 2024-06-03 ENCOUNTER — Other Ambulatory Visit: Payer: Self-pay | Admitting: Internal Medicine

## 2024-06-03 DIAGNOSIS — M542 Cervicalgia: Secondary | ICD-10-CM

## 2024-06-03 DIAGNOSIS — S0990XA Unspecified injury of head, initial encounter: Secondary | ICD-10-CM

## 2024-06-09 ENCOUNTER — Other Ambulatory Visit

## 2024-06-15 ENCOUNTER — Ambulatory Visit
Admission: RE | Admit: 2024-06-15 | Discharge: 2024-06-15 | Disposition: A | Discharge status: Home / Self Care | Payer: Self-pay | Source: Ambulatory Visit | Attending: Internal Medicine | Admitting: Internal Medicine

## 2024-06-15 DIAGNOSIS — S0990XA Unspecified injury of head, initial encounter: Secondary | ICD-10-CM

## 2024-06-15 DIAGNOSIS — M542 Cervicalgia: Secondary | ICD-10-CM

## 2024-07-23 DIAGNOSIS — F1120 Opioid dependence, uncomplicated: Secondary | ICD-10-CM | POA: Diagnosis not present

## 2024-07-23 DIAGNOSIS — Z634 Disappearance and death of family member: Secondary | ICD-10-CM | POA: Diagnosis not present

## 2024-07-23 DIAGNOSIS — F32A Depression, unspecified: Secondary | ICD-10-CM | POA: Diagnosis not present

## 2024-08-13 DIAGNOSIS — I10 Essential (primary) hypertension: Secondary | ICD-10-CM | POA: Diagnosis not present

## 2024-08-13 DIAGNOSIS — G8929 Other chronic pain: Secondary | ICD-10-CM | POA: Diagnosis not present

## 2024-08-13 DIAGNOSIS — F1120 Opioid dependence, uncomplicated: Secondary | ICD-10-CM | POA: Diagnosis not present

## 2024-08-13 DIAGNOSIS — F32A Depression, unspecified: Secondary | ICD-10-CM | POA: Diagnosis not present

## 2024-08-26 DIAGNOSIS — J449 Chronic obstructive pulmonary disease, unspecified: Secondary | ICD-10-CM | POA: Diagnosis not present

## 2024-09-14 DIAGNOSIS — J019 Acute sinusitis, unspecified: Secondary | ICD-10-CM | POA: Diagnosis not present

## 2024-09-14 DIAGNOSIS — F1120 Opioid dependence, uncomplicated: Secondary | ICD-10-CM | POA: Diagnosis not present

## 2024-09-14 DIAGNOSIS — F32A Depression, unspecified: Secondary | ICD-10-CM | POA: Diagnosis not present

## 2024-09-14 DIAGNOSIS — G4700 Insomnia, unspecified: Secondary | ICD-10-CM | POA: Diagnosis not present

## 2024-09-25 DIAGNOSIS — J449 Chronic obstructive pulmonary disease, unspecified: Secondary | ICD-10-CM | POA: Diagnosis not present

## 2024-10-14 DIAGNOSIS — I10 Essential (primary) hypertension: Secondary | ICD-10-CM | POA: Diagnosis not present

## 2024-10-14 DIAGNOSIS — F1120 Opioid dependence, uncomplicated: Secondary | ICD-10-CM | POA: Diagnosis not present

## 2024-10-14 DIAGNOSIS — G4700 Insomnia, unspecified: Secondary | ICD-10-CM | POA: Diagnosis not present

## 2024-10-14 DIAGNOSIS — F32A Depression, unspecified: Secondary | ICD-10-CM | POA: Diagnosis not present

## 2024-11-16 DIAGNOSIS — F1120 Opioid dependence, uncomplicated: Secondary | ICD-10-CM | POA: Diagnosis not present

## 2024-11-16 DIAGNOSIS — F32A Depression, unspecified: Secondary | ICD-10-CM | POA: Diagnosis not present

## 2024-11-16 DIAGNOSIS — I10 Essential (primary) hypertension: Secondary | ICD-10-CM | POA: Diagnosis not present

## 2024-11-16 DIAGNOSIS — G4700 Insomnia, unspecified: Secondary | ICD-10-CM | POA: Diagnosis not present

## 2024-11-25 DIAGNOSIS — J449 Chronic obstructive pulmonary disease, unspecified: Secondary | ICD-10-CM | POA: Diagnosis not present
